# Patient Record
Sex: Female | Born: 1945
Health system: Southern US, Community
[De-identification: ages and names within clinical notes are randomized; demographics above are authoritative.]

## PROBLEM LIST (undated history)

## (undated) DIAGNOSIS — Z860100 Personal history of colon polyps, unspecified: Secondary | ICD-10-CM

## (undated) DIAGNOSIS — D649 Anemia, unspecified: Secondary | ICD-10-CM

## (undated) DIAGNOSIS — H269 Unspecified cataract: Secondary | ICD-10-CM

## (undated) DIAGNOSIS — L4050 Arthropathic psoriasis, unspecified: Secondary | ICD-10-CM

## (undated) DIAGNOSIS — E7800 Pure hypercholesterolemia, unspecified: Secondary | ICD-10-CM

## (undated) HISTORY — DX: Anemia, unspecified: D64.9

## (undated) HISTORY — DX: Unspecified cataract: H26.9

## (undated) HISTORY — PX: ABDOMINAL HYSTERECTOMY: SHX81

---
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
## 1984-10-04 HISTORY — PX: VESICOVAGINAL FISTULA CLOSURE W/ TAH: SUR271

## 1998-08-21 ENCOUNTER — Other Ambulatory Visit: Admission: RE | Admit: 1998-08-21 | Discharge: 1998-08-21 | Payer: Self-pay | Admitting: Obstetrics and Gynecology

## 1998-09-11 ENCOUNTER — Ambulatory Visit (HOSPITAL_COMMUNITY): Admission: RE | Admit: 1998-09-11 | Discharge: 1998-09-11 | Payer: Self-pay | Admitting: Obstetrics and Gynecology

## 1998-09-11 ENCOUNTER — Encounter: Payer: Self-pay | Admitting: Obstetrics and Gynecology

## 2000-01-05 ENCOUNTER — Encounter: Payer: Self-pay | Admitting: Obstetrics and Gynecology

## 2000-01-05 ENCOUNTER — Ambulatory Visit (HOSPITAL_COMMUNITY): Admission: RE | Admit: 2000-01-05 | Discharge: 2000-01-05 | Payer: Self-pay | Admitting: Obstetrics and Gynecology

## 2001-05-29 ENCOUNTER — Ambulatory Visit (HOSPITAL_COMMUNITY): Admission: RE | Admit: 2001-05-29 | Discharge: 2001-05-29 | Payer: Self-pay | Admitting: Obstetrics and Gynecology

## 2001-05-29 ENCOUNTER — Encounter: Payer: Self-pay | Admitting: Obstetrics and Gynecology

## 2001-06-20 ENCOUNTER — Other Ambulatory Visit: Admission: RE | Admit: 2001-06-20 | Discharge: 2001-06-20 | Payer: Self-pay | Admitting: Obstetrics and Gynecology

## 2002-06-08 ENCOUNTER — Ambulatory Visit (HOSPITAL_COMMUNITY): Admission: RE | Admit: 2002-06-08 | Discharge: 2002-06-08 | Payer: Self-pay | Admitting: Obstetrics and Gynecology

## 2002-06-08 ENCOUNTER — Encounter: Payer: Self-pay | Admitting: Obstetrics and Gynecology

## 2002-06-26 ENCOUNTER — Encounter: Admission: RE | Admit: 2002-06-26 | Discharge: 2002-06-26 | Payer: Self-pay | Admitting: Gynecology

## 2002-06-26 ENCOUNTER — Encounter: Payer: Self-pay | Admitting: Gynecology

## 2003-09-12 ENCOUNTER — Encounter: Admission: RE | Admit: 2003-09-12 | Discharge: 2003-09-12 | Payer: Self-pay | Admitting: Gynecology

## 2003-09-16 ENCOUNTER — Other Ambulatory Visit: Admission: RE | Admit: 2003-09-16 | Discharge: 2003-09-16 | Payer: Self-pay | Admitting: Gynecology

## 2003-11-27 ENCOUNTER — Ambulatory Visit (HOSPITAL_COMMUNITY): Admission: RE | Admit: 2003-11-27 | Discharge: 2003-11-27 | Payer: Self-pay | Admitting: Family Medicine

## 2004-12-10 ENCOUNTER — Encounter: Admission: RE | Admit: 2004-12-10 | Discharge: 2004-12-10 | Payer: Self-pay | Admitting: Gynecology

## 2005-09-20 ENCOUNTER — Other Ambulatory Visit: Admission: RE | Admit: 2005-09-20 | Discharge: 2005-09-20 | Payer: Self-pay | Admitting: Gynecology

## 2005-12-21 ENCOUNTER — Encounter (INDEPENDENT_AMBULATORY_CARE_PROVIDER_SITE_OTHER): Payer: Self-pay | Admitting: *Deleted

## 2005-12-21 ENCOUNTER — Ambulatory Visit: Payer: Self-pay | Admitting: Internal Medicine

## 2005-12-21 ENCOUNTER — Ambulatory Visit (HOSPITAL_COMMUNITY): Admission: RE | Admit: 2005-12-21 | Discharge: 2005-12-21 | Payer: Self-pay | Admitting: Internal Medicine

## 2006-03-15 ENCOUNTER — Encounter: Admission: RE | Admit: 2006-03-15 | Discharge: 2006-03-15 | Payer: Self-pay | Admitting: Gynecology

## 2006-11-09 ENCOUNTER — Ambulatory Visit: Payer: Self-pay | Admitting: Orthopedic Surgery

## 2006-11-22 ENCOUNTER — Encounter: Payer: Self-pay | Admitting: Orthopedic Surgery

## 2006-11-22 ENCOUNTER — Ambulatory Visit (HOSPITAL_COMMUNITY): Admission: RE | Admit: 2006-11-22 | Discharge: 2006-11-22 | Payer: Self-pay | Admitting: Family Medicine

## 2007-02-08 ENCOUNTER — Ambulatory Visit: Payer: Self-pay | Admitting: Orthopedic Surgery

## 2007-04-12 ENCOUNTER — Encounter: Admission: RE | Admit: 2007-04-12 | Discharge: 2007-04-12 | Payer: Self-pay | Admitting: Gynecology

## 2008-12-18 ENCOUNTER — Ambulatory Visit: Payer: Self-pay | Admitting: Orthopedic Surgery

## 2008-12-18 DIAGNOSIS — M25819 Other specified joint disorders, unspecified shoulder: Secondary | ICD-10-CM | POA: Insufficient documentation

## 2008-12-18 DIAGNOSIS — M25519 Pain in unspecified shoulder: Secondary | ICD-10-CM | POA: Insufficient documentation

## 2008-12-18 DIAGNOSIS — M7580 Other shoulder lesions, unspecified shoulder: Secondary | ICD-10-CM

## 2008-12-19 ENCOUNTER — Encounter: Payer: Self-pay | Admitting: Orthopedic Surgery

## 2009-01-17 ENCOUNTER — Emergency Department (HOSPITAL_COMMUNITY): Admission: EM | Admit: 2009-01-17 | Discharge: 2009-01-17 | Payer: Self-pay | Admitting: Emergency Medicine

## 2009-03-04 ENCOUNTER — Telehealth: Payer: Self-pay | Admitting: Orthopedic Surgery

## 2009-03-13 ENCOUNTER — Ambulatory Visit: Payer: Self-pay | Admitting: Orthopedic Surgery

## 2009-04-17 ENCOUNTER — Encounter (INDEPENDENT_AMBULATORY_CARE_PROVIDER_SITE_OTHER): Payer: Self-pay | Admitting: *Deleted

## 2009-07-07 ENCOUNTER — Ambulatory Visit: Payer: Self-pay | Admitting: Orthopedic Surgery

## 2009-07-07 DIAGNOSIS — M7500 Adhesive capsulitis of unspecified shoulder: Secondary | ICD-10-CM | POA: Insufficient documentation

## 2009-07-11 ENCOUNTER — Encounter (HOSPITAL_COMMUNITY): Admission: RE | Admit: 2009-07-11 | Discharge: 2009-08-10 | Payer: Self-pay | Admitting: Orthopedic Surgery

## 2009-07-14 ENCOUNTER — Telehealth: Payer: Self-pay | Admitting: Orthopedic Surgery

## 2009-08-08 ENCOUNTER — Encounter: Payer: Self-pay | Admitting: Orthopedic Surgery

## 2009-08-11 ENCOUNTER — Encounter (HOSPITAL_COMMUNITY): Admission: RE | Admit: 2009-08-11 | Discharge: 2009-09-10 | Payer: Self-pay | Admitting: Orthopedic Surgery

## 2009-08-21 ENCOUNTER — Encounter: Admission: RE | Admit: 2009-08-21 | Discharge: 2009-08-21 | Payer: Self-pay | Admitting: Gynecology

## 2009-08-27 ENCOUNTER — Ambulatory Visit: Payer: Self-pay | Admitting: Orthopedic Surgery

## 2009-10-30 ENCOUNTER — Ambulatory Visit (HOSPITAL_COMMUNITY): Admission: RE | Admit: 2009-10-30 | Discharge: 2009-10-30 | Payer: Self-pay | Admitting: Rheumatology

## 2010-08-25 ENCOUNTER — Encounter: Admission: RE | Admit: 2010-08-25 | Discharge: 2010-08-25 | Payer: Self-pay | Admitting: Gynecology

## 2010-09-15 ENCOUNTER — Ambulatory Visit: Payer: Self-pay | Admitting: Orthopedic Surgery

## 2010-09-15 DIAGNOSIS — M19019 Primary osteoarthritis, unspecified shoulder: Secondary | ICD-10-CM | POA: Insufficient documentation

## 2010-09-22 ENCOUNTER — Encounter (HOSPITAL_COMMUNITY)
Admission: RE | Admit: 2010-09-22 | Discharge: 2010-10-22 | Payer: Self-pay | Source: Home / Self Care | Attending: Orthopedic Surgery | Admitting: Orthopedic Surgery

## 2010-10-12 ENCOUNTER — Encounter
Admission: RE | Admit: 2010-10-12 | Discharge: 2010-11-03 | Payer: Self-pay | Source: Home / Self Care | Attending: Orthopedic Surgery | Admitting: Orthopedic Surgery

## 2010-10-13 ENCOUNTER — Encounter: Payer: Self-pay | Admitting: Orthopedic Surgery

## 2010-10-27 ENCOUNTER — Ambulatory Visit
Admission: RE | Admit: 2010-10-27 | Discharge: 2010-10-27 | Payer: Self-pay | Source: Home / Self Care | Attending: Orthopedic Surgery | Admitting: Orthopedic Surgery

## 2010-11-04 ENCOUNTER — Ambulatory Visit (HOSPITAL_COMMUNITY): Payer: Self-pay | Admitting: Occupational Therapy

## 2010-11-05 NOTE — Assessment & Plan Note (Signed)
Summary: 6 WE RECK AFTER PT/[facility]HC/BSF   Visit Type:  Follow-up  CC:  recheck on shoulder.  History of Present Illness: 65 yo female with 2 month history of pain in the RIGHT shoulder, associated with pain reaching across her chest. She does not note any previous history of trauma to the RIGHT shoulder. She did have impingement problems of the LEFT shoulder treated successfully with physical therapy, although the injection. We gave her didn't help.  She thinks that when she stopped exercising his shoulder got worse and she notes when she does some of the exercises that she learned for the  LEFT shoulder. Her RIGHT shoulder is fine   Today is 6 week recheck after PT, progress note for review from 10/21/10.  Diclofenac 50mg  two times a day.  She is better.  No pain today.  Has pain sometimes in PT.     Allergies: 1)  ! Lodine  Physical Exam  Additional Exam:   Things look really good. She has approximately 125 of forward elevation, and 80 of abduction.     Impression & Recommendations:  Problem # 1:  SHOULDER IMPINGEMENT SYNDROME, RIGHT (ICD-726.2) Assessment Improved  Orders: Est. Patient Level II (16109)  Patient Instructions: 1)  Continue therapy until discharged 2)  after you finish therapy do home exercises program 3)  Continue medication 4)  Come back as needed   Orders Added: 1)  Est. Patient Level II [60454]

## 2010-11-05 NOTE — Miscellaneous (Signed)
Summary: Evaluation OT  Evaluation OT   Imported By: Eugenio Hoes 10/14/2010 13:17:27  _____________________________________________________________________  External Attachment:    Type:   Image     Comment:   External Document

## 2010-11-05 NOTE — Assessment & Plan Note (Signed)
Summary: rt shoulder pain needs xr/uhc/bsf   Visit Type:  new problem   CC:  right shoulder pain.  History of Present Illness: 65 yo female with 2 month history of pain in the RIGHT shoulder, associated with pain reaching across her chest. She does not note any previous history of trauma to the RIGHT shoulder. She did have impingement problems of the LEFT shoulder treated successfully with physical therapy, although the injection. We gave her didn't help.  She thinks that when she stopped exercising his shoulder got worse and she notes when she does some of the exercises that she learned for the  LEFT shoulder. Her RIGHT shoulder is fine   Allergies: 1)  ! Lodine  Physical Exam  Skin:  intact without lesions or rashes Cervical Nodes:  no significant adenopathy Psych:  alert and cooperative; normal mood and affect; normal attention span and concentration   Shoulder/Elbow Exam  General:    wt 168 lbs  The patient is well developed and nourished, with normal grooming and hygiene. The body habitus is  small frame   Skin:    Intact, no scars, lesions, rashes, cafe au lait spots or bruising.    Inspection:    Inspection is normal.    Palpation:    tenderness R-AC:   Vascular:    Radial, ulnar, brachial, and axillary pulses 2+ and symmetric; capillary refill less than 2 seconds; no evidence of ischemia, clubbing, or cyanosis.    Sensory:    Gross sensation intact in the upper extremities.    Motor:    Normal strength in the right rotator cuff   Reflexes:    Normal reflexes in the upper extremities.    Shoulder Exam:    Right:    Inspection:  Abnormal    Palpation:  Abnormal    Stability:  stable    Tenderness:  right AC joint    AROM FLEXION: 120  PROM: 150       Range of Motion:       Flexion-Active: 0  Impingement Sign NEER:    Right positive Impingement Sign HAWKINS:    Right positive Apprehension Sign:    Right negative Relocation Test:    Right  negative Sulcus Sign:    Right negative AC joint Adduction Test:    Right positive   Impression & Recommendations:  Problem # 1:  OSTEOARTHRITIS, SHOULDER, RIGHT (ICD-715.91) Assessment New  AC JOINT   Orders: Est. Patient Level IV (04540) Shoulder x-ray,  minimum 2 views (73030)/RIGHT shoulder.  A normal glenohumeral joint normal scapulohumeral line. Type II acromion.  Impression normal shoulder  Problem # 2:  SHOULDER IMPINGEMENT SYNDROME, RIGHT (ICD-726.2) Assessment: New  Orders: Est. Patient Level IV (98119)  Problem # 3:  SHOULDER PAIN (ICD-719.41) Assessment: New  Orders: Est. Patient Level IV (14782)  Her updated medication list for this problem includes:    Diclofenac Sodium 50 Mg Tbec (Diclofenac sodium) .Marland Kitchen... 1 by mouth two times a day  Medications Added to Medication List This Visit: 1)  Diclofenac Sodium 50 Mg Tbec (Diclofenac sodium) .Marland Kitchen.. 1 by mouth two times a day  Patient Instructions: 1)  Take new medication 2)  go for Physical therapy for the shoulder 3)  come back in 6 week for a recheck. Prescriptions: DICLOFENAC SODIUM 50 MG TBEC (DICLOFENAC SODIUM) 1 by mouth two times a day  #60 x 1   Entered and Authorized by:   Fuller Canada MD   Signed by:   Duffy Rhody  Romeo Apple MD on 09/15/2010   Method used:   Print then Give to Patient   RxID:   [id number]    Orders Added: 1)  Est. Patient Level IV [14782] 2)  Shoulder x-ray,  minimum 2 views [73030]

## 2010-11-06 ENCOUNTER — Ambulatory Visit (HOSPITAL_COMMUNITY): Payer: Self-pay | Admitting: Occupational Therapy

## 2010-11-09 ENCOUNTER — Ambulatory Visit (HOSPITAL_COMMUNITY): Payer: Self-pay | Admitting: Occupational Therapy

## 2010-11-12 ENCOUNTER — Ambulatory Visit (HOSPITAL_COMMUNITY)
Admission: RE | Admit: 2010-11-12 | Discharge: 2010-11-12 | Disposition: A | Discharge status: Home / Self Care | Payer: 59 | Source: Ambulatory Visit | Attending: *Deleted | Admitting: *Deleted

## 2011-01-13 LAB — POCT CARDIAC MARKERS: Troponin i, poc: <0.05 ng/mL (ref 0.00–0.09)

## 2011-02-19 NOTE — Procedures (Signed)
NAME:  [name], [name]                     ACCOUNT NO.:  [id number]   MEDICAL RECORD NO.:  [id number]                   PATIENT TYPE:  OUT   LOCATION:  [facility]                                 FACILITY:  [facility]   PHYSICIAN:  Scott A. Gerda Diss, M.D.               DATE OF BIRTH:  [birth year]   DATE OF PROCEDURE:  DATE OF DISCHARGE:                                    STRESS TEST   INDICATIONS:  Severe tachycardia with exercise.   Baseline EKG:  No acute ST-segment changes.  Known slight ST-segment  elevation noted in II and III, also at V4, V5.   Heart rate response to exercise:  Heart rate gradually went up, reaching  maximum heart rate of 152 in stage IV, reached 135 in stage III.   ST-segment response to exercise:  The patient had no ST-segment depression  or T-wave inversion.   Arrhythmias:  The patient had 1 PVC during the whole event.  No accelerated  tachycardias.   Blood pressure response to exercise:  The patient had a good blood pressure  response to exercise but not hypertensive.   Recovery phase:  Uneventful.   Symptomatology:  Shortness of breath when above her maximum.   INTERPRETATION:  Negative stress test.  No sign of ischemia.  Also, there  are no stress-induced arrhythmias.  If the patient continues  to have a  perception of fast heart rate that comes with minimal activity, then the  next step would be a 30-day event monitor, but at this point in time I do  not feel that is necessary.  Recommend for the patient to proceed with her  exercise regimen and to use perceived exertion as a good baseline plus also  counting her heart rate and keeping it at 140 or less while exercising.  To  call if any problems.      ___________________________________________                                            Jonna Coup Gerda Diss, M.D.   Linus Orn  D:  11/27/2003  T:  11/28/2003  Job:  [id number]

## 2011-02-19 NOTE — Op Note (Signed)
[name], [name]           ACCOUNT NO.:  [id number]   MEDICAL RECORD NO.:  [id number]          PATIENT TYPE:  AMB   LOCATION:  DAY                           FACILITY:  [facility]   PHYSICIAN:  Lionel December, M.D.    DATE OF BIRTH:  04/18/1946   DATE OF PROCEDURE:  12/21/2005  DATE OF DISCHARGE:                                 OPERATIVE REPORT   PROCEDURE:  Colonoscopy.   INDICATIONS:  [name] is a 65 year old Afro-American female who is here  for screening colonoscopy. Family history is negative for colorectal  carcinoma. Her last colonoscopy in 2000 was incomplete.   Procedure and risks were reviewed with the patient and informed consent was  obtained.   MEDICINES FOR CONSCIOUS SEDATION:  Demerol 25 mg IV Versed 4 mg IV. Atropine  0.25 mg IV given for asymptomatic bradycardia.   FINDINGS:  Procedure performed in endoscopy suite. The patient's vital signs  and O2 saturations were monitored during procedure and remained stable. The  patient was placed in left lateral position and rectal examination  performed. No abnormality noted on external or digital exam. Olympus  videoscope was placed in the rectum and advanced under vision into sigmoid  colon and beyond. Redundant colon. Preparation was satisfactory. Scope was  passed into cecum using abdominal pressure. Cecum was identified by  ileocecal valve and appendiceal orifice. Pictures taken for the record. As  the scope was withdrawn colonic mucosa was carefully examined. There 2 small  polyps, no more than 3 mm in diameter at ascending colon which were ablated  via cold biopsy and submitted in one container. The mucosa of the rest of  the colon was normal. Rectal mucosa similarly was normal. Scope was  retroflexed to examine anorectal junction which was unremarkable. Endoscope  was straightened and withdrawn. The patient tolerated the procedure well.   FINAL DIAGNOSIS:  Two small polyps at the ascending colon ablated via  cold  biopsy. Rest of the exam was normal.   RECOMMENDATIONS:  Standard instructions given. I will be contacting the  patient with biopsy results and further recommendations.      Lionel December, M.D.  Electronically Signed     NR/MEDQ  D:  12/21/2005  T:  12/22/2005  Job:  [id number]

## 2011-06-16 ENCOUNTER — Telehealth (INDEPENDENT_AMBULATORY_CARE_PROVIDER_SITE_OTHER): Payer: Self-pay | Admitting: *Deleted

## 2011-06-16 DIAGNOSIS — Z86010 Personal history of colonic polyps: Secondary | ICD-10-CM

## 2011-06-16 NOTE — Telephone Encounter (Signed)
TCS sch'd 08/25/11 @ 12:00 (11:00), iron 10 days, split movi prep instructions mailed

## 2011-06-18 MED ORDER — PEG-KCL-NACL-NASULF-NA ASC-C 100 G PO SOLR: 1.0000 | Freq: Once | ORAL | Status: DC

## 2011-07-12 ENCOUNTER — Other Ambulatory Visit: Payer: Self-pay | Admitting: Gynecology

## 2011-07-12 DIAGNOSIS — Z1231 Encounter for screening mammogram for malignant neoplasm of breast: Secondary | ICD-10-CM

## 2011-08-09 ENCOUNTER — Other Ambulatory Visit (INDEPENDENT_AMBULATORY_CARE_PROVIDER_SITE_OTHER): Payer: Self-pay | Admitting: *Deleted

## 2011-08-09 DIAGNOSIS — Z86010 Personal history of colonic polyps: Secondary | ICD-10-CM

## 2011-08-10 ENCOUNTER — Encounter (HOSPITAL_COMMUNITY): Payer: Self-pay | Admitting: Pharmacy Technician

## 2011-08-23 ENCOUNTER — Telehealth (INDEPENDENT_AMBULATORY_CARE_PROVIDER_SITE_OTHER): Payer: Self-pay | Admitting: *Deleted

## 2011-08-23 NOTE — Telephone Encounter (Signed)
PCP/Requesting MD:  Retta Diones  Name: Taleisha Kaczynski  DOB: [birth year]  Home Phone: [phone]      Procedure: TCS  Reason/Indication:  Surveillance, hx polyps  Has patient had this procedure before?  yes  If so, when, by whom and where?  2007--ascending colon, tubular adenoma, no high grade dysplasia or malignancy  Is there a family history of colon cancer?  no  Who?  What age when diagnosed?    Is patient diabetic?   no      Does patient have prosthetic heart valve?  no  Do you have a pacemaker?  no  Has patient had joint replacement within last 12 months?  no  Is patient on Coumadin, Plavix and/or Aspirin? no  Medications: methotrexate 2.7 mg 7 tabs once a week, folic acid 1 mg 1-2 tab daily, calcium, replesta (vit-d) 50.00 iu weekly, fish oil, iron daily  Allergies: nkda  Pharmacy: [facility]  Medication Adjustment: iron 10 days

## 2011-08-23 NOTE — Telephone Encounter (Signed)
Agree with Surveillance colonoscopy.  Ann, please schedule surveillance colonoscopy  Elenora Gamma  NP-C

## 2011-08-24 MED ORDER — SODIUM CHLORIDE 0.45 % IV SOLN
Freq: Once | INTRAVENOUS | Status: AC
  Administered 2011-08-25: 20 mL/h via INTRAVENOUS

## 2011-08-25 ENCOUNTER — Ambulatory Visit (HOSPITAL_COMMUNITY)
Admission: RE | Admit: 2011-08-25 | Discharge: 2011-08-25 | Disposition: A | Discharge status: Home / Self Care | Payer: 59 | Source: Ambulatory Visit | Attending: Internal Medicine | Admitting: Internal Medicine

## 2011-08-25 ENCOUNTER — Encounter (HOSPITAL_COMMUNITY): Payer: Self-pay | Admitting: *Deleted

## 2011-08-25 ENCOUNTER — Encounter (HOSPITAL_COMMUNITY)
Admission: RE | Disposition: A | Discharge status: Home / Self Care | Payer: Self-pay | Source: Ambulatory Visit | Attending: Internal Medicine

## 2011-08-25 DIAGNOSIS — Z09 Encounter for follow-up examination after completed treatment for conditions other than malignant neoplasm: Secondary | ICD-10-CM | POA: Insufficient documentation

## 2011-08-25 DIAGNOSIS — Z860100 Personal history of colon polyps, unspecified: Secondary | ICD-10-CM | POA: Insufficient documentation

## 2011-08-25 DIAGNOSIS — E7800 Pure hypercholesterolemia, unspecified: Secondary | ICD-10-CM | POA: Insufficient documentation

## 2011-08-25 DIAGNOSIS — K5730 Diverticulosis of large intestine without perforation or abscess without bleeding: Secondary | ICD-10-CM | POA: Insufficient documentation

## 2011-08-25 HISTORY — PX: COLONOSCOPY: SHX5424

## 2011-08-25 HISTORY — DX: Arthropathic psoriasis, unspecified: L40.50

## 2011-08-25 HISTORY — DX: Personal history of colon polyps, unspecified: Z86.0100

## 2011-08-25 HISTORY — DX: Pure hypercholesterolemia, unspecified: E78.00

## 2011-08-25 HISTORY — DX: Personal history of colonic polyps: Z86.010

## 2011-08-25 SURGERY — COLONOSCOPY
Anesthesia: Moderate Sedation

## 2011-08-25 MED ORDER — MEPERIDINE HCL 50 MG/ML IJ SOLN
INTRAMUSCULAR | Status: DC | PRN
  Administered 2011-08-25: 25 mg

## 2011-08-25 MED ORDER — MIDAZOLAM HCL 5 MG/5ML IJ SOLN
INTRAMUSCULAR | Status: AC
  Filled 2011-08-25: qty 10

## 2011-08-25 MED ORDER — ATROPINE SULFATE 1 MG/ML IJ SOLN
INTRAMUSCULAR | Status: AC
  Filled 2011-08-25: qty 1

## 2011-08-25 MED ORDER — STERILE WATER FOR IRRIGATION IR SOLN
Status: DC | PRN
  Administered 2011-08-25: 12:00:00

## 2011-08-25 MED ORDER — ATROPINE SULFATE 1 MG/ML IJ SOLN
INTRAMUSCULAR | Status: DC | PRN
  Administered 2011-08-25: .5 mg via INTRAVENOUS

## 2011-08-25 MED ORDER — MIDAZOLAM HCL 5 MG/5ML IJ SOLN
INTRAMUSCULAR | Status: DC | PRN
  Administered 2011-08-25: 1 mg via INTRAVENOUS
  Administered 2011-08-25 (×2): 2 mg via INTRAVENOUS

## 2011-08-25 MED ORDER — MEPERIDINE HCL 50 MG/ML IJ SOLN
INTRAMUSCULAR | Status: AC
  Filled 2011-08-25: qty 1

## 2011-08-25 NOTE — H&P (Signed)
[name] is an 65 y.o. female.   Chief Complaint: Patient is here for colonoscopy. HPI: Patient is 65 year old African female who had two small tubular adenomas removed from her ascending colon in March 2007. She is here for surveillance colonoscopy. Family history is negative for colorectal carcinoma. She has good appetite. She denies abdominal pain, change in her bowel habits or rectal bleeding.  Past Medical History  Diagnosis Date  . Hypercholesterolemia   . Psoriatic arthritis   . Hx of colonic polyps     Past Surgical History  Procedure Date  . Left knee staple in 1962  . Left knee stable out 1966  . Vesicovaginal fistula closure w/ tah 1986    Family History  Problem Relation Age of Onset  . Arthritis      FH  . Diabetes      FH  . Cancer      FH  . Anesthesia problems Mother   . Hypotension Neg Hx   . Malignant hyperthermia Neg Hx   . Pseudochol deficiency Neg Hx    Social History:  reports that she has never smoked. She does not have any smokeless tobacco history on file. She reports that she does not drink alcohol or use illicit drugs.  Allergies:  Allergies  Allergen Reactions  . Etodolac     REACTION: chest pain    Medications Prior to Admission  Medication Dose Route Frequency Provider Last Rate Last Dose  . 0.45 % sodium chloride infusion   Intravenous Once Malissa Hippo, MD 20 mL/hr at 08/25/11 1151 20 mL/hr at 08/25/11 1151  . meperidine (DEMEROL) 50 MG/ML injection           . midazolam (VERSED) 5 MG/5ML injection           . simethicone susp in sterile water 1000 mL irrigation    PRN Malissa Hippo, MD       Medications Prior to Admission  Medication Sig Dispense Refill  . peg 3350 powder (MOVIPREP) 100 G SOLR Take 1 kit (100 g total) by mouth once.  1 kit  0    No results found for this or any previous visit (from the past 48 hour(s)). No results found.  Review of Systems  Constitutional: Negative for weight loss.    Gastrointestinal: Negative for abdominal pain, diarrhea, constipation, blood in stool and melena.    Blood pressure 108/60, pulse 51, temperature 97.9 F (36.6 C), temperature source Oral, resp. rate 14, height 5\' 3"  (1.6 m), weight 155 lb (70.308 kg), SpO2 100.00%. Physical Exam  Constitutional: She appears well-developed and well-nourished.  HENT:  Mouth/Throat: Oropharynx is clear and moist.  Eyes: Conjunctivae are normal. No scleral icterus.  Neck: No thyromegaly present.  Cardiovascular: Normal rate, regular rhythm and normal heart sounds.   No murmur heard. Respiratory: Effort normal and breath sounds normal.  GI: Soft. She exhibits no distension and no mass. There is no tenderness.  Musculoskeletal: She exhibits no edema.  Lymphadenopathy:    She has no cervical adenopathy.  Neurological: She is alert.  Skin: Skin is warm.     Assessment/Plan History of colonic adenomas. Surveillance colonoscopy.  [name] 08/25/2011, 12:03 PM

## 2011-08-25 NOTE — Op Note (Signed)
COLONOSCOPY PROCEDURE REPORT  PATIENT:  [name]  MR#:  [id number] Birthdate:  1946-10-04, 65 y.o., female Endoscopist:  Dr. Malissa Hippo, MD Referred By:  Dr. Lilyan Punt, MD  Procedure Date: 08/25/2011  Procedure:   Colonoscopy  Indications:  Patient is 65 year old African American female who at 2 small tubular adenomas removed from her ascending colon in March 2007. She is undergoing surveillance colonoscopy. Family history is negative for colorectal carcinoma.  Informed Consent: Procedure and risks were reviewed with the patient and informed consent was obtained.   Medications:  Demerol 25 mg IV Versed 5 mg IV  Description of procedure:  After a digital rectal exam was performed, that colonoscope was advanced from the anus through the rectum and colon to the area of the cecum, ileocecal valve and appendiceal orifice. The cecum was deeply intubated. These structures were well-seen and photographed for the record. From the level of the cecum and ileocecal valve, the scope was slowly and cautiously withdrawn. The mucosal surfaces were carefully surveyed utilizing scope tip to flexion to facilitate fold flattening as needed. The scope was pulled down into the rectum where a thorough exam including retroflexion was performed.  Findings:   Prep excellent. Single diverticulum next to ileocecal valve. Normal mucosa of colon and rectum . Unremarkable anorectal junction l  Therapeutic/Diagnostic Maneuvers Performed:  None  Complications:  None  Cecal Withdrawal Time:  8 minutes  Impression:  Examination performed to cecum. Single cecal diverticulum otherwise normal colonoscopy.  Recommendations:  Standard instructions given. Since no polyps found on today's exam, she could wait seven years before her next colonoscopy.  [name] U  08/25/2011 12:38 PM  CC: Dr. Lilyan Punt, MD.

## 2011-08-30 ENCOUNTER — Ambulatory Visit
Admission: RE | Admit: 2011-08-30 | Discharge: 2011-08-30 | Disposition: A | Discharge status: Home / Self Care | Payer: 59 | Source: Ambulatory Visit | Attending: Gynecology | Admitting: Gynecology

## 2011-08-30 DIAGNOSIS — Z1231 Encounter for screening mammogram for malignant neoplasm of breast: Secondary | ICD-10-CM

## 2011-09-02 ENCOUNTER — Encounter (HOSPITAL_COMMUNITY): Payer: Self-pay | Admitting: Internal Medicine

## 2012-07-18 ENCOUNTER — Other Ambulatory Visit: Payer: Self-pay

## 2012-07-18 DIAGNOSIS — M79609 Pain in unspecified limb: Secondary | ICD-10-CM

## 2012-08-07 ENCOUNTER — Encounter: Payer: Self-pay | Admitting: Vascular Surgery

## 2012-08-08 ENCOUNTER — Ambulatory Visit (INDEPENDENT_AMBULATORY_CARE_PROVIDER_SITE_OTHER): Payer: Medicare Other | Admitting: Vascular Surgery

## 2012-08-08 ENCOUNTER — Encounter: Payer: Self-pay | Admitting: Vascular Surgery

## 2012-08-08 VITALS — BP 127/79 | HR 47 | Resp 18 | Ht 63.0 in | Wt 145.0 lb

## 2012-08-08 DIAGNOSIS — R230 Cyanosis: Secondary | ICD-10-CM | POA: Insufficient documentation

## 2012-08-08 DIAGNOSIS — M79609 Pain in unspecified limb: Secondary | ICD-10-CM

## 2012-08-08 NOTE — Progress Notes (Signed)
Ankle brachial indices performed @ [facility] 08/08/2012

## 2012-08-08 NOTE — Progress Notes (Signed)
Subjective:     Patient ID: [name], female   DOB: Apr 20, 1946, 66 y.o.   MRN: [id number]  HPI this 66 year old female was referred by Dr. Lilyan Punt for possible circulation issues right foot. The patient states that she has noted some discoloration of her right fourth toe and some "wrinkling" of the skin between the toes. She has no history of ulceration, gangrene, rest pain, numbness in the foot, or nonhealing ulcers. She denies claudication is able to ambulate a few miles without difficulty. Her father had diabetes and she is concerned about circulation issues.  Past Medical History  Diagnosis Date  . Hypercholesterolemia   . Psoriatic arthritis   . Hx of colonic polyps     History  Substance Use Topics  . Smoking status: Never Smoker   . Smokeless tobacco: Never Used  . Alcohol Use: No    Family History  Problem Relation Age of Onset  . Arthritis      FH  . Diabetes      FH  . Cancer      FH  . Anesthesia problems Mother   . Cancer Mother   . Hypertension Mother   . Hypotension Neg Hx   . Malignant hyperthermia Neg Hx   . Pseudochol deficiency Neg Hx   . Cancer Father   . Diabetes Father   . Deep vein thrombosis Father   . Hypertension Father   . Hypertension Sister   . Hypertension Brother   . Heart disease Brother     Allergies  Allergen Reactions  . Etodolac     REACTION: chest pain    Current outpatient prescriptions:calcium carbonate (OS-CAL) 600 MG TABS, Take 600 mg by mouth 2 (two) times daily with a meal.  , Disp: , Rfl: ;  ferrous fumarate (HEMOCYTE - 106 MG FE) 325 (106 FE) MG TABS, Take 1 tablet by mouth daily.  , Disp: , Rfl: ;  fish oil-omega-3 fatty acids 1000 MG capsule, Take 1 g by mouth daily.  , Disp: , Rfl: ;  peg 3350 powder (MOVIPREP) 100 G SOLR, Take 1 kit (100 g total) by mouth once., Disp: 1 kit, Rfl: 0 pravastatin (PRAVACHOL) 20 MG tablet, Take 20 mg by mouth at bedtime.  , Disp: , Rfl:   BP 127/79  Pulse 47  Resp 18  Ht  5\' 3"  (1.6 m)  Wt 145 lb (65.772 kg)  BMI 25.69 kg/m2  Body mass index is 25.69 kg/(m^2).          Review of Systems denies chest pain, dyspnea on exertion, PND, orthopnea, hemoptysis, All systems negative and complete review of systems    Objective:   Physical Exam blood pressure 127/79 heart rate 47 respirations 18 Gen.-alert and oriented x3 in no apparent distress HEENT normal for age Lungs no rhonchi or wheezing Cardiovascular regular rhythm no murmurs carotid pulses 3+ palpable no bruits audible Abdomen soft nontender no palpable masses Musculoskeletal free of  major deformities Skin clear -no rashes Neurologic normal Lower extremities 3+ femoral and dorsalis pedis pulses palpable bilaterally with no edema Slight darkening of the skin between the right third and fourth toes no ulceration, ischemia, gangrene, or infection  Today I ordered lower extremity arterial Doppler studies which are reviewed and interpreted. Patient has normal ABIs bilaterally with triphasic flow-normal study     Assessment:     Normal lower extremity arterial exam-slight discoloration right fourth toe-does not appear embolic in nature or ischemic    Plan:  Recommend aspirin 81 mg one daily Continue to observe lower extremities no other treatment needed at this time

## 2012-09-08 ENCOUNTER — Other Ambulatory Visit: Payer: Self-pay | Admitting: Gynecology

## 2012-09-08 ENCOUNTER — Encounter (INDEPENDENT_AMBULATORY_CARE_PROVIDER_SITE_OTHER): Payer: Self-pay | Admitting: *Deleted

## 2012-09-08 DIAGNOSIS — Z1231 Encounter for screening mammogram for malignant neoplasm of breast: Secondary | ICD-10-CM

## 2012-09-12 ENCOUNTER — Ambulatory Visit (INDEPENDENT_AMBULATORY_CARE_PROVIDER_SITE_OTHER): Payer: Medicare Other | Admitting: Internal Medicine

## 2012-09-12 ENCOUNTER — Encounter (INDEPENDENT_AMBULATORY_CARE_PROVIDER_SITE_OTHER): Payer: Self-pay | Admitting: Internal Medicine

## 2012-09-12 VITALS — BP 104/62 | HR 72 | Temp 97.2°F | Ht 62.2 in | Wt 147.2 lb

## 2012-09-12 DIAGNOSIS — D649 Anemia, unspecified: Secondary | ICD-10-CM | POA: Insufficient documentation

## 2012-09-12 NOTE — Patient Instructions (Addendum)
CBC today.  

## 2012-09-12 NOTE — Progress Notes (Addendum)
Subjective:     Patient ID: [name], female   DOB: [birth year], 66 y.o.   MRN: [id number]  HPI Referred to our office by Alycia Rossetti for iron deficiency anemia/colonoscopy.  Noted11/26/2013 H and H 10.5 and 31.2, MCV 73.2, Iron 58, Vitamin B12 671, Ferritin 24, She tells me she has been anemic in the past. Appetite is good. No weight loss unintentional . No abdominal pain. No acid reflux. Has a BM about  2-4 times a day which is normal. Stools are dark green to brown. No melena or bright red rectal bleeding. No NSAIDS She tells me she just gave blood November 15. She tells me her hemoglobin was 12.0 then.  08/25/2011 Colonoscopy for hx of tubular adenomas:Impression:  Examination performed to cecum.  Single cecal diverticulum otherwise normal colonoscopy.      Review of Systems see hpi Current Outpatient Prescriptions  Medication Sig Dispense Refill  . ferrous fumarate (HEMOCYTE - 106 MG FE) 325 (106 FE) MG TABS Take 1 tablet by mouth daily.        . fish oil-omega-3 fatty acids 1000 MG capsule Take 1 g by mouth daily.         Past Medical History  Diagnosis Date  . Hypercholesterolemia   . Psoriatic arthritis   . Hx of colonic polyps    Past Surgical History  Procedure Date  . Left knee staple in 1962  . Left knee stable out 1966  . Vesicovaginal fistula closure w/ tah 1986  . Colonoscopy 08/25/2011    Procedure: COLONOSCOPY;  Surgeon: Malissa Hippo, MD;  Location: [facility];  Service: Endoscopy;  Laterality: N/A;  12:00   Allergies  Allergen Reactions  . Etodolac     REACTION: chest pain       Objective:   Physical Exam  Filed Vitals:   09/12/12 1554  BP: 104/62  Pulse: 72  Temp: 97.2 F (36.2 C)  Height: 5' 2.2" (1.58 m)  Weight: 147 lb 3.2 oz (66.769 kg)   Alert and oriented. Skin warm and dry. Oral mucosa is moist.   . Sclera anicteric, conjunctivae is pink. Thyroid not enlarged. No cervical lymphadenopathy. Lungs clear. Heart regular rate  and rhythm.  Abdomen is soft. Bowel sounds are positive. No hepatomegaly. No abdominal masses felt. No tenderness.  No edema to lower extremities.  Stool brown and guaiac negative.     Assessment:    Anemia. Recent colonoscopy in 2012 was normal. Recently gave blood to the [employer]. Heme negative today in office.     Plan:    CBC today. Hopefully H and H increased. I will discuss with Dr. Karilyn Cota.

## 2012-09-13 LAB — CBC WITH DIFFERENTIAL/PLATELET
Eosinophils Absolute: 0.1 10*3/uL (ref 0.0–0.7)
HCT: 32.4 % — ABNORMAL LOW (ref 36.0–46.0)
Lymphocytes Relative: 49 % — ABNORMAL HIGH (ref 12–46)
Lymphs Abs: 2.9 10*3/uL (ref 0.7–4.0)
MCHC: 33.6 g/dL (ref 30.0–36.0)
Monocytes Relative: 7 % (ref 3–12)
Neutrophils Relative %: 41 % — ABNORMAL LOW (ref 43–77)

## 2012-09-18 ENCOUNTER — Encounter (INDEPENDENT_AMBULATORY_CARE_PROVIDER_SITE_OTHER): Payer: Self-pay

## 2012-09-20 ENCOUNTER — Telehealth (INDEPENDENT_AMBULATORY_CARE_PROVIDER_SITE_OTHER): Payer: Self-pay | Admitting: Internal Medicine

## 2012-09-20 NOTE — Telephone Encounter (Signed)
[name], needs CBC in 2 weeks. Thank you

## 2012-09-21 ENCOUNTER — Telehealth (INDEPENDENT_AMBULATORY_CARE_PROVIDER_SITE_OTHER): Payer: Self-pay | Admitting: *Deleted

## 2012-09-21 DIAGNOSIS — D649 Anemia, unspecified: Secondary | ICD-10-CM

## 2012-09-21 NOTE — Telephone Encounter (Signed)
This has been noted.

## 2012-09-21 NOTE — Telephone Encounter (Signed)
Per Dorene Ar NP the patient will need to have CBC/D in 2 weeks

## 2012-09-28 ENCOUNTER — Encounter (INDEPENDENT_AMBULATORY_CARE_PROVIDER_SITE_OTHER): Payer: Self-pay

## 2012-09-28 ENCOUNTER — Telehealth (INDEPENDENT_AMBULATORY_CARE_PROVIDER_SITE_OTHER): Payer: Self-pay | Admitting: Internal Medicine

## 2012-09-28 NOTE — Telephone Encounter (Signed)
ALL 3 STOOL CARDS ARE NEGATIVE.

## 2012-10-18 ENCOUNTER — Ambulatory Visit
Admission: RE | Admit: 2012-10-18 | Discharge: 2012-10-18 | Disposition: A | Discharge status: Home / Self Care | Payer: Medicare Other | Source: Ambulatory Visit | Attending: Gynecology | Admitting: Gynecology

## 2012-10-18 DIAGNOSIS — Z1231 Encounter for screening mammogram for malignant neoplasm of breast: Secondary | ICD-10-CM

## 2012-12-22 ENCOUNTER — Other Ambulatory Visit: Payer: Self-pay | Admitting: *Deleted

## 2012-12-22 ENCOUNTER — Telehealth: Payer: Self-pay | Admitting: Family Medicine

## 2012-12-22 DIAGNOSIS — E559 Vitamin D deficiency, unspecified: Secondary | ICD-10-CM

## 2012-12-22 DIAGNOSIS — D509 Iron deficiency anemia, unspecified: Secondary | ICD-10-CM

## 2012-12-22 NOTE — Telephone Encounter (Signed)
Nurse: Msg: Patient is requesting bloodwork order * please include Vit D and Iron check CB:[phone]

## 2012-12-22 NOTE — Telephone Encounter (Signed)
Cbc,ferritin, tibc, vitd    Reason iron def anemia and vit d

## 2012-12-23 ENCOUNTER — Encounter: Payer: Self-pay | Admitting: *Deleted

## 2012-12-29 ENCOUNTER — Other Ambulatory Visit: Payer: Self-pay

## 2012-12-29 DIAGNOSIS — D509 Iron deficiency anemia, unspecified: Secondary | ICD-10-CM

## 2012-12-29 DIAGNOSIS — E559 Vitamin D deficiency, unspecified: Secondary | ICD-10-CM

## 2012-12-29 LAB — IRON AND TIBC
%SAT: 15 % — ABNORMAL LOW (ref 20–55)
Iron: 55 ug/dL (ref 42–145)
TIBC: 358 ug/dL (ref 250–470)

## 2012-12-29 LAB — CBC
Hemoglobin: 11.9 g/dL — ABNORMAL LOW (ref 12.0–15.0)
MCHC: 33.7 g/dL (ref 30.0–36.0)
Platelets: 251 10*3/uL (ref 150–400)
RDW: 17.2 % — ABNORMAL HIGH (ref 11.5–15.5)

## 2012-12-30 LAB — VITAMIN D 25 HYDROXY (VIT D DEFICIENCY, FRACTURES): Vit D, 25-Hydroxy: 47 ng/mL (ref 30–89)

## 2013-01-03 ENCOUNTER — Telehealth: Payer: Self-pay | Admitting: Family Medicine

## 2013-01-03 NOTE — Telephone Encounter (Signed)
Mailed copy of lab results to patient. Patient notified.

## 2013-01-03 NOTE — Telephone Encounter (Signed)
Patient would like a copy of her Blood work results.  States she called on Monday 01/01/2013 and was told she could view online, however no one called her back with instructions to view.  Also wants to know if she needs an appointment with Doctor.

## 2013-01-04 ENCOUNTER — Telehealth: Payer: Self-pay | Admitting: Family Medicine

## 2013-01-04 NOTE — Telephone Encounter (Signed)
Discussed with patient. Patient scheduled office visit to discuss labs. 

## 2013-01-04 NOTE — Telephone Encounter (Signed)
Her lab work was reviewed. I compared it was in the chart. I do believe she ought to followup for an office visit. Hemoglobin actually somewhat better than what it was. We can discuss more when she follows up.

## 2013-01-04 NOTE — Telephone Encounter (Signed)
Nurse: 1:19PM  Message: [name] was directed back to the provider office via [facility]/IT. She is needing her activation code to be able to access her my chart.  (*) [name] would like to know if Dr. Lorin Picket would like to see her since she has completed her lab/bloodwork.

## 2013-01-09 ENCOUNTER — Ambulatory Visit (INDEPENDENT_AMBULATORY_CARE_PROVIDER_SITE_OTHER): Payer: Medicare Other | Admitting: Family Medicine

## 2013-01-09 ENCOUNTER — Encounter: Payer: Self-pay | Admitting: Family Medicine

## 2013-01-09 VITALS — BP 112/76 | HR 60 | Ht 63.0 in | Wt 148.0 lb

## 2013-01-09 DIAGNOSIS — E785 Hyperlipidemia, unspecified: Secondary | ICD-10-CM | POA: Insufficient documentation

## 2013-01-09 NOTE — Progress Notes (Signed)
Subjective:     Patient ID: [name], female   DOB: 05-24-1946, 67 y.o.   MRN: [id number]  HPIpatient doing a great job watching her diet taking red rice yeast extract she is also intriguing for her psoriasis with methotrexate and also has a history of iron deficient anemia but she is up-to-date on colonoscopy had a colonoscopy in 2012 she denies any abdominal pain sweats chills or fever.  Family history social history personal history all reviewed   Review of Systemsdenies chest tightness pressure pain shortness of breath     Objective:   Physical Exam Vital signs stable blood pressure recheck and is good lungs are clear hearts regular abdomen soft extremities no edema skin warm dry    Assessment:     Hyperlipidemia check lab work toward the end of May continue red rice yeast extract, continue methotrexate for psoriasis followed by the specialist, she will be seeing Dr. Nicholas Lose in the near future and getting bone density test. She ought to followup with [place] again in approximately 6-9 months.     Plan:     See above

## 2013-01-09 NOTE — Patient Instructions (Signed)
Continue to follow a healthy diet. Continued to do your exercises. I would not recommend any changes currently. I would like to see back in 6 months. Have a great summer.

## 2013-01-22 ENCOUNTER — Other Ambulatory Visit: Payer: Self-pay | Admitting: Gynecology

## 2013-01-22 DIAGNOSIS — E2839 Other primary ovarian failure: Secondary | ICD-10-CM

## 2013-01-23 ENCOUNTER — Other Ambulatory Visit: Payer: Self-pay | Admitting: Dermatology

## 2013-01-26 ENCOUNTER — Other Ambulatory Visit: Payer: Medicare Other

## 2013-01-30 ENCOUNTER — Ambulatory Visit
Admission: RE | Admit: 2013-01-30 | Discharge: 2013-01-30 | Disposition: A | Discharge status: Home / Self Care | Payer: Medicare Other | Source: Ambulatory Visit | Attending: Gynecology | Admitting: Gynecology

## 2013-01-30 DIAGNOSIS — E2839 Other primary ovarian failure: Secondary | ICD-10-CM

## 2013-02-22 ENCOUNTER — Encounter: Payer: Self-pay | Admitting: Family Medicine

## 2013-02-22 ENCOUNTER — Ambulatory Visit (INDEPENDENT_AMBULATORY_CARE_PROVIDER_SITE_OTHER): Payer: Medicare Other | Admitting: Family Medicine

## 2013-02-22 VITALS — BP 118/78 | HR 70 | Wt 148.8 lb

## 2013-02-22 DIAGNOSIS — G4700 Insomnia, unspecified: Secondary | ICD-10-CM | POA: Insufficient documentation

## 2013-02-22 NOTE — Progress Notes (Signed)
  Subjective:    Patient ID: [name], female    DOB: 1946-09-16, 67 y.o.   MRN: [id number]  HPI Patient in today for Insomnia. States she has been having trouble sleeping for years but it has gotten worse since the death of her husband 2 years.  This patient denies any coughing wheezing difficulty breathing at night denies any headaches fevers chills she states she can fall asleep but she doesn't stay asleep more than a few hours and she wakes up and she is awake for several hours before falling back asleep she denies excessive tiredness during the day she denies excessive drowsiness. Does not fall asleep with driving. Past medical history benign Social her husband died 2 years ago  Review of Systems    ROS see above, she denies restless legs Objective:   Physical Exam Blood pressure good lungs clear heart regular       Assessment & Plan:  Insomnia-long discussion held with patient 15-20 minutes. Many different choices could be tried. I recommend melatonin. No more than 1 mg at the evening. If this doesn't help over the next few weeks then consider trazodone. I do not feel that nerve tablets would be beneficial for this patient. I don't recommend Ambien because she does want to be able to wake up during the night.

## 2013-02-22 NOTE — Patient Instructions (Signed)
Melatonin 1 mg 60 minutes before bedtime  Trazadone is an option

## 2013-03-21 LAB — LIPID PANEL
Cholesterol: 229 mg/dL — ABNORMAL HIGH (ref 0–200)
Triglycerides: 83 mg/dL (ref <150)
VLDL: 17 mg/dL (ref 0–40)

## 2013-03-23 ENCOUNTER — Telehealth: Payer: Self-pay | Admitting: Family Medicine

## 2013-03-23 NOTE — Telephone Encounter (Signed)
Patient is calling to inquire about Lab results.  Please contact patient regarding these results when the are complete.  Thanks

## 2013-03-26 ENCOUNTER — Telehealth: Payer: Self-pay | Admitting: *Deleted

## 2013-03-26 NOTE — Telephone Encounter (Signed)
See lab results with notation in labs

## 2013-07-02 ENCOUNTER — Other Ambulatory Visit: Payer: Self-pay | Admitting: Gynecology

## 2013-07-02 DIAGNOSIS — N644 Mastodynia: Secondary | ICD-10-CM

## 2013-07-05 ENCOUNTER — Telehealth: Payer: Self-pay | Admitting: Family Medicine

## 2013-07-05 NOTE — Telephone Encounter (Signed)
Pt found lump under arm and having right breast pain. She saw gyn and she has a breast ultrasound scheduled for Monday. She has a family history of cancer on both sides. She has a follow up with Dr. Lorin Picket on the 23rd of this month. Would like your opinion on seeing someone who prescribes alternative meds or natural meds.

## 2013-07-05 NOTE — Telephone Encounter (Signed)
Patient has questions she would like to talk to Dr Lorin Picket about before her upcoming appointment.

## 2013-07-05 NOTE — Telephone Encounter (Signed)
Left message on voicemail to return call.

## 2013-07-05 NOTE — Telephone Encounter (Signed)
I do not know anyone in that particular field. It is very important note that the patient follow through regarding testing on this area in the breast. I will be happy to discuss further with her when she follows up on the 23rd. She should certainly consider getting ultrasound if recommended by gynecology. If she is interested in seeing a breast specialist we can set that up.

## 2013-07-09 ENCOUNTER — Other Ambulatory Visit: Payer: Self-pay | Admitting: Gynecology

## 2013-07-09 ENCOUNTER — Ambulatory Visit
Admission: RE | Admit: 2013-07-09 | Discharge: 2013-07-09 | Disposition: A | Discharge status: Home / Self Care | Payer: Medicare Other | Source: Ambulatory Visit | Attending: Gynecology | Admitting: Gynecology

## 2013-07-09 DIAGNOSIS — N644 Mastodynia: Secondary | ICD-10-CM

## 2013-07-26 ENCOUNTER — Ambulatory Visit: Payer: Medicare Other | Admitting: Family Medicine

## 2013-08-01 NOTE — Telephone Encounter (Signed)
Patient has office visit scheduled to discuss. Ultrasound and diagnostic mammo was norma 07/09/13-Patient aware-see report.

## 2013-08-06 ENCOUNTER — Encounter: Payer: Self-pay | Admitting: Family Medicine

## 2013-08-06 ENCOUNTER — Ambulatory Visit (INDEPENDENT_AMBULATORY_CARE_PROVIDER_SITE_OTHER): Payer: Medicare Other | Admitting: Family Medicine

## 2013-08-06 VITALS — BP 126/82 | Ht 62.0 in | Wt 143.6 lb

## 2013-08-06 DIAGNOSIS — E785 Hyperlipidemia, unspecified: Secondary | ICD-10-CM

## 2013-08-06 DIAGNOSIS — D649 Anemia, unspecified: Secondary | ICD-10-CM

## 2013-08-06 DIAGNOSIS — M8580 Other specified disorders of bone density and structure, unspecified site: Secondary | ICD-10-CM | POA: Insufficient documentation

## 2013-08-06 DIAGNOSIS — M25511 Pain in right shoulder: Secondary | ICD-10-CM

## 2013-08-06 DIAGNOSIS — E559 Vitamin D deficiency, unspecified: Secondary | ICD-10-CM

## 2013-08-06 DIAGNOSIS — M899 Disorder of bone, unspecified: Secondary | ICD-10-CM

## 2013-08-06 DIAGNOSIS — G4700 Insomnia, unspecified: Secondary | ICD-10-CM

## 2013-08-06 DIAGNOSIS — M25519 Pain in unspecified shoulder: Secondary | ICD-10-CM

## 2013-08-06 NOTE — Progress Notes (Signed)
  Subjective:    Patient ID: [name], female    DOB: 10/11/1945, 67 y.o.   MRN: [id number]  HPI Patient arrives for a follow up on cholesterol. Patient continues to have problems sleeping. Patient states she's been trying to follow a relatively healthy diet. She also relates that she does the best he can add staying physically active. Recently went to a wellness course where they taught her healthy eating. She currently is not taking in any knee. She denies fever sweats chills vomiting. She has a history cholesterol arthritis she also has a history of vitamin D deficiency osteopenia as well as psoriasis. She is on methotrexate. She states she has had some fatigue recently she wonders if this is related to anemia related to her methotrexate. She does not take an B12 she has had vitamin D deficiency in the past. She does take vitamin D currently. Family history noncontributory Review of systems see above see above she also relates that she has a pertinent pronounced knot On the proximal clavicle on the right side  Review of Systems     Objective:   Physical Exam Her lungs are clear no crackles respiratory rate is normal heart is regular abdomen is soft extremities no edema skin warm dry neurologic grossly normal patient does have a pronounced calcium deposits on the proximal aspect of the right clavicle       Assessment & Plan:  [employer]- [name] is working with it currently does not want any medications Hyperlipid--she relates she's taking red rice yeast extract she is trying to follow a healthy diet as much is possible Clavicle calcification proximal aspect-we will get x-ray more than likely calcium deposit Anemia probably related to methotrexate-patient vegetarian possible B12 deficiency History vitamin D deficiency-taking vitamin D but with history of osteopenia Will check vitamin D level Significant issues in time spent [id number]

## 2013-08-08 LAB — CBC
HCT: 34.5 % — ABNORMAL LOW (ref 36.0–46.0)
Hemoglobin: 12.1 g/dL (ref 12.0–15.0)
MCH: 25.6 pg — ABNORMAL LOW (ref 26.0–34.0)
MCHC: 35.1 g/dL (ref 30.0–36.0)
MCV: 72.9 fL — ABNORMAL LOW (ref 78.0–100.0)
RBC: 4.73 MIL/uL (ref 3.87–5.11)

## 2013-08-08 LAB — LIPID PANEL
LDL Cholesterol: 140 mg/dL — ABNORMAL HIGH (ref 0–99)
Total CHOL/HDL Ratio: 3.3 Ratio
VLDL: 10 mg/dL (ref 0–40)

## 2013-08-08 LAB — BASIC METABOLIC PANEL
Calcium: 9.5 mg/dL (ref 8.4–10.5)
Creat: 0.81 mg/dL (ref 0.50–1.10)
Sodium: 138 mEq/L (ref 135–145)

## 2013-08-09 ENCOUNTER — Encounter: Payer: Self-pay | Admitting: Family Medicine

## 2013-08-09 LAB — VITAMIN B12: Vitamin B-12: 685 pg/mL (ref 211–911)

## 2013-08-09 LAB — VITAMIN D 25 HYDROXY (VIT D DEFICIENCY, FRACTURES): Vit D, 25-Hydroxy: 53 ng/mL (ref 30–89)

## 2013-09-19 ENCOUNTER — Ambulatory Visit: Payer: Medicare Other | Admitting: Podiatrist

## 2013-10-11 ENCOUNTER — Ambulatory Visit: Payer: Medicare Other | Admitting: Podiatrist

## 2013-10-29 ENCOUNTER — Other Ambulatory Visit: Payer: Self-pay

## 2013-10-29 DIAGNOSIS — Z1231 Encounter for screening mammogram for malignant neoplasm of breast: Secondary | ICD-10-CM

## 2013-10-31 ENCOUNTER — Ambulatory Visit: Payer: Medicare Other

## 2013-11-20 ENCOUNTER — Ambulatory Visit: Payer: Medicare Other | Admitting: Family Medicine

## 2013-11-27 ENCOUNTER — Ambulatory Visit: Payer: Medicare Other | Admitting: Family Medicine

## 2013-11-28 ENCOUNTER — Ambulatory Visit (INDEPENDENT_AMBULATORY_CARE_PROVIDER_SITE_OTHER): Payer: Medicare Other | Admitting: Family Medicine

## 2013-11-28 ENCOUNTER — Encounter: Payer: Self-pay | Admitting: Family Medicine

## 2013-11-28 VITALS — BP 112/70 | Ht 62.0 in | Wt 141.8 lb

## 2013-11-28 DIAGNOSIS — L4050 Arthropathic psoriasis, unspecified: Secondary | ICD-10-CM

## 2013-11-28 DIAGNOSIS — E785 Hyperlipidemia, unspecified: Secondary | ICD-10-CM

## 2013-11-28 NOTE — Progress Notes (Signed)
   Subjective:    Patient ID: [name], female    DOB: [birth year], 68 y.o.   MRN: [id number]  HPI Patient arrives for follow up- [place] She does have some difficulty with heavy lifting but otherwise is very active. In addition to this takes her medicine as directed denies any particular problems currently she had cholesterol done a few months ago she wonders if it needs to be repeated again currently  Review of Systems Positive for psoriasis negative for chest pain shortness of breath denies behavioral problems    Objective:   Physical Exam Lungs clear heart regular pulse normal blood pressure recheck good extremities no edema       Assessment & Plan:  #1 psoriatic arthritis continue methotrexate under the care of the specialist doing well overall #2 hyperlipidemia I reviewed over her numbers she rates at a very low risk of heart disease no need for statins, no need to repeat it until mid summer followup at that time #3 patient is 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

## 2013-11-29 ENCOUNTER — Ambulatory Visit: Payer: Medicare Other

## 2013-12-18 ENCOUNTER — Ambulatory Visit
Admission: RE | Admit: 2013-12-18 | Discharge: 2013-12-18 | Disposition: A | Discharge status: Home / Self Care | Payer: Medicare Other | Source: Ambulatory Visit

## 2013-12-18 DIAGNOSIS — Z1231 Encounter for screening mammogram for malignant neoplasm of breast: Secondary | ICD-10-CM

## 2014-06-07 ENCOUNTER — Telehealth: Payer: Self-pay | Admitting: Family Medicine

## 2014-06-07 DIAGNOSIS — G4700 Insomnia, unspecified: Secondary | ICD-10-CM

## 2014-06-07 NOTE — Telephone Encounter (Signed)
It is possible pt may have sleep apnea and abnormal restless legs. Refer for sleep study with Dr Gerilyn Pilgrim please

## 2014-06-07 NOTE — Telephone Encounter (Signed)
Patient says that she has previously discussed with Dr. Lorin Picket her concerns about not being able to sleep. She has tried Melatonin with no help. She wants to know if we can set her up with a sleep study?

## 2014-06-11 NOTE — Telephone Encounter (Signed)
Left message on voicemail notifying patient that referral for sleep study has been initiated.

## 2014-06-21 ENCOUNTER — Other Ambulatory Visit (HOSPITAL_COMMUNITY): Payer: Self-pay | Admitting: Respiratory Therapy

## 2014-07-08 ENCOUNTER — Ambulatory Visit: Payer: Medicare Other | Attending: Family Medicine | Admitting: Sleep Medicine

## 2014-07-08 VITALS — Ht 62.0 in | Wt 143.0 lb

## 2014-07-08 DIAGNOSIS — G4733 Obstructive sleep apnea (adult) (pediatric): Secondary | ICD-10-CM

## 2014-07-08 DIAGNOSIS — Z79899 Other long term (current) drug therapy: Secondary | ICD-10-CM | POA: Diagnosis not present

## 2014-07-08 DIAGNOSIS — R0683 Snoring: Secondary | ICD-10-CM | POA: Diagnosis not present

## 2014-07-08 DIAGNOSIS — G4710 Hypersomnia, unspecified: Secondary | ICD-10-CM | POA: Insufficient documentation

## 2014-07-08 DIAGNOSIS — R5383 Other fatigue: Secondary | ICD-10-CM | POA: Insufficient documentation

## 2014-07-13 NOTE — Sleep Study (Signed)
  [facility] Emily Ambers A. Gerilyn Pilgrimoonquah, MD     [email or web address]        NOCTURNAL POLYSOMNOGRAM    LOCATION: SLEEP LAB FACILITY: [facility]   PHYSICIAN: Theoren Palka A. Gerilyn Fields, M.D.   DATE OF STUDY: 07/08/2014.   REFERRING PHYSICIAN: [name].   INDICATIONS: The patient is a 68 year old presents with hypersomnia, fatigue and snoring.  MEDICATIONS:  Prior to Admission medications   Medication Sig Start Date End Date Taking? Authorizing Provider  Calcium Carbonate-Vit D-Min (CALCIUM 1200 PO) Take by mouth.    Historical Provider, MD  Cholecalciferol (VITAMIN D-3) 1000 UNITS CAPS Take by mouth.    Historical Provider, MD  clobetasol ointment (TEMOVATE) 0.05 %  11/23/12   Historical Provider, MD  ferrous fumarate (HEMOCYTE - 106 MG FE) 325 (106 FE) MG TABS Take 1 tablet by mouth daily.      Historical Provider, MD  fish oil-omega-3 fatty acids 1000 MG capsule Take 1 g by mouth daily.      Historical Provider, MD  folic acid (FOLVITE) 1 MG tablet Take 1 mg by mouth daily. Take 2 po qd    Historical Provider, MD  methotrexate (RHEUMATREX) 2.5 MG tablet 2.5 mg once a week. Take 6 tablets po every week as directed 12/20/12   Historical Provider, MD  Probiotic Product (PROBIOTIC DAILY PO) Take by mouth.    Historical Provider, MD  Red Yeast Rice 600 MG CAPS Take by mouth.    Historical Provider, MD      EPWORTH SLEEPINESS SCALE: 11.   BMI: 26.   ARCHITECTURAL SUMMARY: Total recording time was 395 minutes. Sleep efficiency 70 %. Sleep latency 11 minutes. REM latency 77 minutes. Stage NI 6 %, N2 77 % and N3 11 % and REM sleep 6 %.    RESPIRATORY DATA:  Baseline oxygen saturation is 98 %. The lowest saturation is 92 %. The diagnostic AHI is 1. The RDI is 2. The REM AHI is 21.  LIMB MOVEMENT SUMMARY: PLM index 4.   ELECTROCARDIOGRAM SUMMARY: Average heart rate is 46 with no significant dysrhythmias observed.   IMPRESSION:  1. Unremarkable nocturnal polysomnography.  Thanks for  this referral.  [name], M.D. Diplomat, Biomedical engineerAmerican Board of Sleep Medicine.

## 2014-12-04 LAB — HM DIABETES EYE EXAM

## 2014-12-13 ENCOUNTER — Telehealth: Payer: Self-pay | Admitting: Family Medicine

## 2014-12-13 DIAGNOSIS — Z79899 Other long term (current) drug therapy: Secondary | ICD-10-CM

## 2014-12-13 DIAGNOSIS — R6889 Other general symptoms and signs: Secondary | ICD-10-CM

## 2014-12-13 DIAGNOSIS — E785 Hyperlipidemia, unspecified: Secondary | ICD-10-CM

## 2014-12-13 DIAGNOSIS — D649 Anemia, unspecified: Secondary | ICD-10-CM

## 2014-12-13 NOTE — Telephone Encounter (Signed)
Patient scheduled an appointment for 12/27/14 for a check up and concerned about high levels of copper in her water that she recently discovered.  She wants to know if Dr. Lorin PicketScott wants her to have labs done before appointment?

## 2014-12-15 NOTE — Telephone Encounter (Signed)
Copper, ceruloplasin level, also due for CBC,liver,lipid,met 7

## 2014-12-16 NOTE — Telephone Encounter (Signed)
bloodwork orders ready. Pt notified on voicemail.  

## 2014-12-20 LAB — HEPATIC FUNCTION PANEL
ALBUMIN: 4.4 g/dL (ref 3.6–4.8)
ALT: 16 IU/L (ref 0–32)
AST: 18 IU/L (ref 0–40)
Alkaline Phosphatase: 90 IU/L (ref 39–117)
Bilirubin Total: 0.2 mg/dL (ref 0.0–1.2)
Bilirubin, Direct: 0.08 mg/dL (ref 0.00–0.40)
Total Protein: 6.6 g/dL (ref 6.0–8.5)

## 2014-12-20 LAB — BASIC METABOLIC PANEL
BUN/Creatinine Ratio: 11 (ref 11–26)
BUN: 9 mg/dL (ref 8–27)
CO2: 25 mmol/L (ref 18–29)
Calcium: 9.2 mg/dL (ref 8.7–10.3)
Chloride: 103 mmol/L (ref 97–108)
Creatinine, Ser: 0.79 mg/dL (ref 0.57–1.00)
GFR calc Af Amer: 89 mL/min/{1.73_m2} (ref >59)
GFR, EST NON AFRICAN AMERICAN: 77 mL/min/{1.73_m2} (ref >59)
GLUCOSE: 80 mg/dL (ref 65–99)
Potassium: 4.3 mmol/L (ref 3.5–5.2)
Sodium: 142 mmol/L (ref 134–144)

## 2014-12-20 LAB — CBC WITH DIFFERENTIAL/PLATELET
BASOS: 1 %
Basophils Absolute: 0 10*3/uL (ref 0.0–0.2)
Eos: 2 %
Eosinophils Absolute: 0.1 10*3/uL (ref 0.0–0.4)
HEMATOCRIT: 34.8 % (ref 34.0–46.6)
HEMOGLOBIN: 11.4 g/dL (ref 11.1–15.9)
Immature Grans (Abs): 0 10*3/uL (ref 0.0–0.1)
Immature Granulocytes: 0 %
LYMPHS ABS: 2 10*3/uL (ref 0.7–3.1)
Lymphs: 45 %
MCH: 24.5 pg — ABNORMAL LOW (ref 26.6–33.0)
MCHC: 32.8 g/dL (ref 31.5–35.7)
MCV: 75 fL — ABNORMAL LOW (ref 79–97)
MONOCYTES: 7 %
MONOS ABS: 0.3 10*3/uL (ref 0.1–0.9)
NEUTROS PCT: 45 %
Neutrophils Absolute: 2.1 10*3/uL (ref 1.4–7.0)
Platelets: 213 10*3/uL (ref 150–379)
RBC: 4.66 x10E6/uL (ref 3.77–5.28)
RDW: 17.3 % — AB (ref 12.3–15.4)
WBC: 4.4 10*3/uL (ref 3.4–10.8)

## 2014-12-20 LAB — LIPID PANEL
CHOLESTEROL TOTAL: 235 mg/dL — AB (ref 100–199)
Chol/HDL Ratio: 3.5 ratio units (ref 0.0–4.4)
HDL: 68 mg/dL (ref >39)
LDL CALC: 144 mg/dL — AB (ref 0–99)
Triglycerides: 113 mg/dL (ref 0–149)
VLDL Cholesterol Cal: 23 mg/dL (ref 5–40)

## 2014-12-20 LAB — CERULOPLASMIN: CERULOPLASMIN: 25.9 mg/dL (ref 19.0–39.0)

## 2014-12-20 LAB — COPPER, SERUM: COPPER: 115 ug/dL (ref 72–166)

## 2014-12-27 ENCOUNTER — Ambulatory Visit (INDEPENDENT_AMBULATORY_CARE_PROVIDER_SITE_OTHER): Payer: Medicare Other | Admitting: Family Medicine

## 2014-12-27 ENCOUNTER — Encounter: Payer: Self-pay | Admitting: Family Medicine

## 2014-12-27 VITALS — BP 118/78 | Ht 62.0 in | Wt 153.0 lb

## 2014-12-27 DIAGNOSIS — L4050 Arthropathic psoriasis, unspecified: Secondary | ICD-10-CM | POA: Insufficient documentation

## 2014-12-27 DIAGNOSIS — E785 Hyperlipidemia, unspecified: Secondary | ICD-10-CM | POA: Diagnosis not present

## 2014-12-27 DIAGNOSIS — Z23 Encounter for immunization: Secondary | ICD-10-CM | POA: Diagnosis not present

## 2014-12-27 NOTE — Progress Notes (Signed)
   Subjective:    Patient ID: Emily SicksCatherine G Endicott, female    DOB: [birth year], 69 y.o.   MRN: [id number]  Hyperlipidemia This is a chronic problem. The current episode started more than 1 year ago. Pertinent negatives include no chest pain. Compliance problems include adherence to diet (walks three times a week).   pt had water tested and it had high levels of copper. Bloodtest was done 3/16.  Patient had a lot of persistent nausea she notices her dish towels were turning blue green she talked with a environmental company who told her she probably had high and there was high levels of Copper   Review of Systems  Constitutional: Negative for activity change, appetite change and fatigue.  HENT: Negative for congestion.   Respiratory: Negative for cough.   Cardiovascular: Negative for chest pain.  Gastrointestinal: Negative for abdominal pain.  Endocrine: Negative for polydipsia and polyphagia.  Neurological: Negative for weakness.  Psychiatric/Behavioral: Negative for confusion.       Objective:   Physical Exam  Constitutional: She appears well-nourished. No distress.  Cardiovascular: Normal rate, regular rhythm and normal heart sounds.   No murmur heard. Pulmonary/Chest: Effort normal and breath sounds normal. No respiratory distress.  Musculoskeletal: She exhibits no edema.  Lymphadenopathy:    She has no cervical adenopathy.  Neurological: She is alert. She exhibits normal muscle tone.  Psychiatric: Her behavior is normal.  Vitals reviewed.         Assessment & Plan:  The patient will resume her red rice yeast extract to help treat cholesterol issues. She has not tolerated statins  Discussed 81 mg aspirin patient will consider  Copper levels normal patient now has filter on her water  Patient relates taking her methotrexate for her psoriatic arthritis. She is not due for colonoscopy currently  Follow-up 6 months

## 2014-12-27 NOTE — Patient Instructions (Signed)
DASH Eating Plan °DASH stands for "Dietary Approaches to Stop Hypertension." The DASH eating plan is a healthy eating plan that has been shown to reduce high blood pressure (hypertension). Additional health benefits may include reducing the risk of type 2 diabetes mellitus, heart disease, and stroke. The DASH eating plan may also help with weight loss. °WHAT DO I NEED TO KNOW ABOUT THE DASH EATING PLAN? °For the DASH eating plan, you will follow these general guidelines: °· Choose foods with a percent daily value for sodium of less than 5% (as listed on the food label). °· Use salt-free seasonings or herbs instead of table salt or sea salt. °· Check with your health care provider or pharmacist before using salt substitutes. °· Eat lower-sodium products, often labeled as "lower sodium" or "no salt added." °· Eat fresh foods. °· Eat more vegetables, fruits, and low-fat dairy products. °· Choose whole grains. Look for the word "whole" as the first word in the ingredient list. °· Choose fish and skinless chicken or turkey more often than red meat. Limit fish, poultry, and meat to 6 oz (170 g) each day. °· Limit sweets, desserts, sugars, and sugary drinks. °· Choose heart-healthy fats. °· Limit cheese to 1 oz (28 g) per day. °· Eat more home-cooked food and less restaurant, buffet, and fast food. °· Limit fried foods. °· Cook foods using methods other than frying. °· Limit canned vegetables. If you do use them, rinse them well to decrease the sodium. °· When eating at a restaurant, ask that your food be prepared with less salt, or no salt if possible. °WHAT FOODS CAN I EAT? °Seek help from a dietitian for individual calorie needs. °Grains °Whole grain or whole wheat bread. Brown rice. Whole grain or whole wheat pasta. Quinoa, bulgur, and whole grain cereals. Low-sodium cereals. Corn or whole wheat flour tortillas. Whole grain cornbread. Whole grain crackers. Low-sodium crackers. °Vegetables °Fresh or frozen vegetables  (raw, steamed, roasted, or grilled). Low-sodium or reduced-sodium tomato and vegetable juices. Low-sodium or reduced-sodium tomato sauce and paste. Low-sodium or reduced-sodium canned vegetables.  °Fruits °All fresh, canned (in natural juice), or frozen fruits. °Meat and Other Protein Products °Ground beef (85% or leaner), grass-fed beef, or beef trimmed of fat. Skinless chicken or turkey. Ground chicken or turkey. Pork trimmed of fat. All fish and seafood. Eggs. Dried beans, peas, or lentils. Unsalted nuts and seeds. Unsalted canned beans. °Dairy °Low-fat dairy products, such as skim or 1% milk, 2% or reduced-fat cheeses, low-fat ricotta or cottage cheese, or plain low-fat yogurt. Low-sodium or reduced-sodium cheeses. °Fats and Oils °Tub margarines without trans fats. Light or reduced-fat mayonnaise and salad dressings (reduced sodium). Avocado. Safflower, olive, or canola oils. Natural peanut or almond butter. °Other °Unsalted popcorn and pretzels. °The items listed above may not be a complete list of recommended foods or beverages. Contact your dietitian for more options. °WHAT FOODS ARE NOT RECOMMENDED? °Grains °White bread. White pasta. White rice. Refined cornbread. Bagels and croissants. Crackers that contain trans fat. °Vegetables °Creamed or fried vegetables. Vegetables in a cheese sauce. Regular canned vegetables. Regular canned tomato sauce and paste. Regular tomato and vegetable juices. °Fruits °Dried fruits. Canned fruit in light or heavy syrup. Fruit juice. °Meat and Other Protein Products °Fatty cuts of meat. Ribs, chicken wings, bacon, sausage, bologna, salami, chitterlings, fatback, hot dogs, bratwurst, and packaged luncheon meats. Salted nuts and seeds. Canned beans with salt. °Dairy °Whole or 2% milk, cream, half-and-half, and cream cheese. Whole-fat or sweetened yogurt. Full-fat   cheeses or blue cheese. Nondairy creamers and whipped toppings. Processed cheese, cheese spreads, or cheese  curds. °Condiments °Onion and garlic salt, seasoned salt, table salt, and sea salt. Canned and packaged gravies. Worcestershire sauce. Tartar sauce. Barbecue sauce. Teriyaki sauce. Soy sauce, including reduced sodium. Steak sauce. Fish sauce. Oyster sauce. Cocktail sauce. Horseradish. Ketchup and mustard. Meat flavorings and tenderizers. Bouillon cubes. Hot sauce. Tabasco sauce. Marinades. Taco seasonings. Relishes. °Fats and Oils °Butter, stick margarine, lard, shortening, ghee, and bacon fat. Coconut, palm kernel, or palm oils. Regular salad dressings. °Other °Pickles and olives. Salted popcorn and pretzels. °The items listed above may not be a complete list of foods and beverages to avoid. Contact your dietitian for more information. °WHERE CAN I FIND MORE INFORMATION? °National Heart, Lung, and Blood Institute: www.nhlbi.nih.gov/health/health-topics/topics/dash/ °Document Released: 09/09/2011 Document Revised: 02/04/2014 Document Reviewed: 07/25/2013 °ExitCare® Patient Information ©2015 [employer]. This information is not intended to replace advice given to you by your health care provider. Make sure you discuss any questions you have with your health care provider. ° °

## 2015-01-23 ENCOUNTER — Encounter: Payer: Self-pay | Admitting: *Deleted

## 2015-10-24 ENCOUNTER — Other Ambulatory Visit: Payer: Self-pay | Admitting: Rheumatology

## 2015-10-24 DIAGNOSIS — M8580 Other specified disorders of bone density and structure, unspecified site: Secondary | ICD-10-CM

## 2015-12-01 ENCOUNTER — Ambulatory Visit
Admission: RE | Admit: 2015-12-01 | Discharge: 2015-12-01 | Disposition: A | Discharge status: Home / Self Care | Payer: Medicare Other | Source: Ambulatory Visit | Attending: Rheumatology | Admitting: Rheumatology

## 2015-12-01 DIAGNOSIS — M8580 Other specified disorders of bone density and structure, unspecified site: Secondary | ICD-10-CM

## 2016-03-30 ENCOUNTER — Encounter: Payer: Self-pay | Admitting: Family Medicine

## 2016-03-30 ENCOUNTER — Ambulatory Visit (INDEPENDENT_AMBULATORY_CARE_PROVIDER_SITE_OTHER): Payer: Medicare Other | Admitting: Family Medicine

## 2016-03-30 VITALS — BP 110/70 | Ht 62.0 in | Wt 151.2 lb

## 2016-03-30 DIAGNOSIS — E785 Hyperlipidemia, unspecified: Secondary | ICD-10-CM | POA: Diagnosis not present

## 2016-03-30 DIAGNOSIS — L4050 Arthropathic psoriasis, unspecified: Secondary | ICD-10-CM

## 2016-03-30 DIAGNOSIS — M8580 Other specified disorders of bone density and structure, unspecified site: Secondary | ICD-10-CM

## 2016-03-30 DIAGNOSIS — Z23 Encounter for immunization: Secondary | ICD-10-CM | POA: Diagnosis not present

## 2016-03-30 NOTE — Progress Notes (Signed)
   Subjective:    Patient ID: [name], female    DOB: 12-12-1945, 70 y.o.   MRN: [id number]  Hyperlipidemia This is a chronic problem. The current episode started more than 1 year ago. There are no known factors aggravating her hyperlipidemia. Current antihyperlipidemic treatment includes diet change. The current treatment provides moderate improvement of lipids. There are no compliance problems.  There are no known risk factors for coronary artery disease.   Patient states that she has no concerns at this time.  Patient does have psoriatic arthritis sees a specialist for this. Patient up-to-date on colonoscopy but is not highly interested in having any aggressive preventative measures  Review of Systems  Constitutional: Negative for activity change and appetite change.  Gastrointestinal: Negative for vomiting and abdominal pain.  Musculoskeletal: Positive for back pain and arthralgias.  Neurological: Negative for weakness.  Psychiatric/Behavioral: Negative for confusion.       Objective:   Physical Exam  Constitutional: She appears well-nourished. No distress.  Cardiovascular: Normal rate, regular rhythm and normal heart sounds.   No murmur heard. Pulmonary/Chest: Effort normal and breath sounds normal. No respiratory distress.  Musculoskeletal: She exhibits no edema.  Lymphadenopathy:    She has no cervical adenopathy.  Neurological: She is alert. She exhibits normal muscle tone.  Psychiatric: Her behavior is normal.  Vitals reviewed.         Assessment & Plan:  Hyperlipidemia this patient does not want to be on any cholesterol medicines. She does state sure use red rice yeast extract and watch her diet try to exercise on a regular basis  Preventative measures discuss in detail patient up-to-date on colonoscopy Safety dietary reinforced Mammogram recommended yearly but at the very least every 2 years

## 2016-04-08 ENCOUNTER — Other Ambulatory Visit: Payer: Self-pay | Admitting: Family Medicine

## 2016-04-09 LAB — LIPID PANEL
Cholesterol: 224 mg/dL — ABNORMAL HIGH (ref 125–200)
HDL: 74 mg/dL (ref >=46)
LDL Cholesterol: 136 mg/dL — ABNORMAL HIGH (ref <130)
Total CHOL/HDL Ratio: 3 Ratio (ref <=5.0)
Triglycerides: 71 mg/dL (ref <150)
VLDL: 14 mg/dL (ref <30)

## 2016-04-11 ENCOUNTER — Encounter: Payer: Self-pay | Admitting: Family Medicine

## 2016-06-30 ENCOUNTER — Emergency Department (HOSPITAL_COMMUNITY)
Admission: EM | Admit: 2016-06-30 | Discharge: 2016-06-30 | Disposition: A | Discharge status: Home / Self Care | Payer: Medicare Other | Attending: Emergency Medicine | Admitting: Emergency Medicine

## 2016-06-30 ENCOUNTER — Emergency Department (HOSPITAL_COMMUNITY): Payer: Medicare Other

## 2016-06-30 ENCOUNTER — Encounter (HOSPITAL_COMMUNITY): Payer: Self-pay

## 2016-06-30 DIAGNOSIS — W2209XA Striking against other stationary object, initial encounter: Secondary | ICD-10-CM | POA: Diagnosis not present

## 2016-06-30 DIAGNOSIS — Y999 Unspecified external cause status: Secondary | ICD-10-CM | POA: Insufficient documentation

## 2016-06-30 DIAGNOSIS — S99912A Unspecified injury of left ankle, initial encounter: Secondary | ICD-10-CM | POA: Diagnosis present

## 2016-06-30 DIAGNOSIS — Y9301 Activity, walking, marching and hiking: Secondary | ICD-10-CM | POA: Diagnosis not present

## 2016-06-30 DIAGNOSIS — Y929 Unspecified place or not applicable: Secondary | ICD-10-CM | POA: Diagnosis not present

## 2016-06-30 DIAGNOSIS — S93402A Sprain of unspecified ligament of left ankle, initial encounter: Secondary | ICD-10-CM | POA: Insufficient documentation

## 2016-06-30 NOTE — ED Triage Notes (Signed)
Complains of left foot pain between ankle and heel. Stated this morning. Denies injury.

## 2016-06-30 NOTE — ED Notes (Signed)
Patient states she was walking this morning and hit her left ankle on a rocky surface. States "I came home and was ok and then it started hurting."

## 2016-06-30 NOTE — ED Provider Notes (Signed)
[facility] Provider Note   CSN: [id number] Arrival date & time: 06/30/16  1239     History   Chief Complaint Chief Complaint  Patient presents with  . Foot Injury    HPI [name] is a 70 y.o. female.  The history is provided by the patient. No language interpreter was used.  Foot Injury   The incident occurred 6 to 12 hours ago. The incident occurred at home. There was no injury mechanism. The pain location is generalized. The quality of the pain is described as aching. The pain is moderate. The pain has been constant since onset. She reports no foreign bodies present. Nothing aggravates the symptoms. She has tried nothing for the symptoms. The treatment provided moderate relief.  Pt complains of pain in her left ankle.  Pt reports [facility] [place] [facility]  Pt reports she did hit area while walking  Past Medical History:  Diagnosis Date  . Hx of colonic polyps   . Hypercholesterolemia   . Psoriatic arthritis [facility])     Patient Active Problem List   Diagnosis Date Noted  . Psoriatic arthritis (HCC) 12/27/2014  . Unspecified vitamin D deficiency 08/06/2013  . Osteopenia 08/06/2013  . Insomnia 02/22/2013  . Hyperlipidemia 01/09/2013  . Anemia 09/12/2012  . Toe cyanosis 08/08/2012  . OSTEOARTHRITIS, SHOULDER, RIGHT 09/15/2010  . ADHESIVE CAPSULITIS, LEFT 07/07/2009  . SHOULDER PAIN 12/18/2008  . IMPINGEMENT SYNDROME 12/18/2008    Past Surgical History:  Procedure Laterality Date  . ABDOMINAL HYSTERECTOMY    . COLONOSCOPY  08/25/2011   Procedure: COLONOSCOPY;  Surgeon: Malissa Hippo, MD;  Location: [facility];  Service: Endoscopy;  Laterality: N/A;  12:00  . left knee stable out  1966  . left knee staple in  1962  . VESICOVAGINAL FISTULA CLOSURE W/ TAH  1986    OB History    No data available       Home Medications    Prior to Admission medications   Medication Sig Start Date End Date Taking? Authorizing Provider    Calcium Carbonate-Vit D-Min (CALCIUM 1200 PO) Take by mouth.    Historical Provider, MD  Cholecalciferol (VITAMIN D-3) 1000 UNITS CAPS Take by mouth.    Historical Provider, MD  clobetasol ointment (TEMOVATE) 0.05 %  11/23/12   Historical Provider, MD  ferrous fumarate (HEMOCYTE - 106 MG FE) 325 (106 FE) MG TABS Take 1 tablet by mouth daily.      Historical Provider, MD  fish oil-omega-3 fatty acids 1000 MG capsule Take 1 g by mouth daily.      Historical Provider, MD  folic acid (FOLVITE) 1 MG tablet Take 1 mg by mouth daily. Take 2 po qd    Historical Provider, MD  methotrexate (RHEUMATREX) 2.5 MG tablet 2.5 mg once a week. Take 7 tablets po every week as directed 12/20/12   Historical Provider, MD  Probiotic Product (PROBIOTIC DAILY PO) Take by mouth.    Historical Provider, MD  Red Yeast Rice 600 MG CAPS Take by mouth.    Historical Provider, MD  tacrolimus (PROTOPIC) 0.1 % ointment  11/25/14   Historical Provider, MD    Family History Family History  Problem Relation Age of Onset  . Anesthesia problems Mother   . Cancer Mother   . Hypertension Mother   . Cancer Father   . Diabetes Father   . Deep vein thrombosis Father   . Hypertension Father   . Arthritis  FH  . Diabetes      FH  . Cancer      FH  . Hypertension Sister   . Hypertension Brother   . Heart disease Brother   . Hypotension Neg Hx   . Malignant hyperthermia Neg Hx   . Pseudochol deficiency Neg Hx     Social History Social History  Substance Use Topics  . Smoking status: Never Smoker  . Smokeless tobacco: Never Used  . Alcohol use No     Allergies   Etodolac   Review of Systems Review of Systems  All other systems reviewed and are negative.    Physical Exam Updated Vital Signs BP 133/76 (BP Location: Left Arm)   Pulse 72   Temp 98.5 F (36.9 C) (Oral)   Resp 16   Ht 5\' 2"  (1.575 m)   Wt 68 kg   SpO2 100%   BMI 27.44 kg/m   Physical Exam  Constitutional: She is oriented to person,  place, and time. She appears well-developed and well-nourished.  HENT:  Head: Normocephalic.  Eyes: EOM are normal.  Neck: Normal range of motion.  Pulmonary/Chest: Effort normal.  Abdominal: She exhibits no distension.  Musculoskeletal: Normal range of motion. She exhibits tenderness.  Tender lateral ankle and foot, tender malleolus  Neurological: She is alert and oriented to person, place, and time.  Psychiatric: She has a normal mood and affect.  Nursing note and vitals reviewed.    ED Treatments / Results  Labs (all labs ordered are listed, but only abnormal results are displayed) Labs Reviewed - No data to display  EKG  EKG Interpretation None       Radiology Dg Foot Complete Left  Result Date: 06/30/2016 CLINICAL DATA:  Whole left foot pain laterally. EXAM: LEFT FOOT - COMPLETE 3+ VIEW COMPARISON:  None. FINDINGS: There is no evidence of fracture or dislocation. There is mild osteoarthritis of the first TMT joint first MTP joint. There is a small plantar calcaneal spur. The the there is mild osteoarthritis of the talonavicular joint. There is ankylosis of the second, third, fourth and fifth PIP joints. There is relative pes planus. Soft tissues are unremarkable. IMPRESSION: No acute osseous injury of the left foot. Electronically Signed   By: Elige KoHetal  Patel   On: 06/30/2016 13:17    Procedures Procedures (including critical care time)  Medications Ordered in ED Medications - No data to display   Initial Impression / Assessment and Plan / ED Course  I have reviewed the triage vital signs and the nursing notes.  Pertinent labs & imaging results that were available during my care of the patient were reviewed by me and considered in my medical decision making (see chart for details).  Clinical Course    ASO Crutches Follow up with Dr. Romeo AppleHarrison for recheck in 1 week if pain persist  Final Clinical Impressions(s) / ED Diagnoses   Final diagnoses:  Ankle sprain,  left, initial encounter    New Prescriptions New Prescriptions   No medications on file     Elson AreasLeslie K Suzanne Kho, PA-C 06/30/16 1404    Linwood DibblesJon Knapp, MD 07/02/16 [phone]

## 2016-07-08 ENCOUNTER — Other Ambulatory Visit: Payer: Self-pay | Admitting: Family Medicine

## 2016-07-08 ENCOUNTER — Other Ambulatory Visit (HOSPITAL_COMMUNITY): Payer: Self-pay | Admitting: Diagnostic Radiology

## 2016-07-08 DIAGNOSIS — Z1231 Encounter for screening mammogram for malignant neoplasm of breast: Secondary | ICD-10-CM

## 2016-07-27 ENCOUNTER — Ambulatory Visit: Payer: Medicare Other | Admitting: Rheumatology

## 2016-07-27 ENCOUNTER — Telehealth: Payer: Self-pay | Admitting: Radiology

## 2016-07-27 NOTE — Telephone Encounter (Signed)
Called patient to advise labs are normal they can be accessed via [facility] lab computer/ CBC CMP

## 2016-08-08 NOTE — Progress Notes (Deleted)
*  IMAGE* Office Visit Note  Patient: [name]             Date of Birth: 01-Dec-1945           MRN: [id number]             PCP: [name], MD Referring: Babs SciaraLuking, Scott A, MD Visit Date: 08/11/2016 Occupation:@GUAROCC @    Subjective:  No chief complaint on file.   History of Present Illness: [name] is a 70 y.o. female ***   Activities of Daily Living:  Patient reports morning stiffness for *** {minute/hour:19697}.   Patient {ACTIONS;DENIES/REPORTS:21021675::"Denies"} nocturnal pain.  Difficulty dressing/grooming: {ACTIONS;DENIES/REPORTS:21021675::"Denies"} Difficulty climbing stairs: {ACTIONS;DENIES/REPORTS:21021675::"Denies"} Difficulty getting out of chair: {ACTIONS;DENIES/REPORTS:21021675::"Denies"} Difficulty using hands for taps, buttons, cutlery, and/or writing: {ACTIONS;DENIES/REPORTS:21021675::"Denies"}   No Rheumatology ROS completed.   PMFS History:  Patient Active Problem List   Diagnosis Date Noted  . Psoriatic arthritis (HCC) 12/27/2014  . Unspecified vitamin D deficiency 08/06/2013  . Osteopenia 08/06/2013  . Insomnia 02/22/2013  . Hyperlipidemia 01/09/2013  . Anemia 09/12/2012  . Toe cyanosis 08/08/2012  . OSTEOARTHRITIS, SHOULDER, RIGHT 09/15/2010  . ADHESIVE CAPSULITIS, LEFT 07/07/2009  . SHOULDER PAIN 12/18/2008  . IMPINGEMENT SYNDROME 12/18/2008    Past Medical History:  Diagnosis Date  . Hx of colonic polyps   . Hypercholesterolemia   . Psoriatic arthritis (HCC)     Family History  Problem Relation Age of Onset  . Anesthesia problems Mother   . Cancer Mother   . Hypertension Mother   . Cancer Father   . Diabetes Father   . Deep vein thrombosis Father   . Hypertension Father   . Arthritis      FH  . Diabetes      FH  . Cancer      FH  . Hypertension Sister   . Hypertension Brother   . Heart disease Brother   . Hypotension Neg Hx   . Malignant hyperthermia Neg Hx   . Pseudochol deficiency Neg Hx    Past  Surgical History:  Procedure Laterality Date  . ABDOMINAL HYSTERECTOMY    . COLONOSCOPY  08/25/2011   Procedure: COLONOSCOPY;  Surgeon: Malissa HippoNajeeb U Rehman, MD;  Location: [facility];  Service: Endoscopy;  Laterality: N/A;  12:00  . left knee stable out  1966  . left knee staple in  1962  . VESICOVAGINAL FISTULA CLOSURE W/ TAH  1986   Social History   Social History Narrative  . No narrative on file     Objective: Vital Signs: There were no vitals taken for this visit.   Physical Exam   Musculoskeletal Exam: ***  CDAI Exam: No CDAI exam completed.    Investigation: No additional findings.   Imaging: No results found.  Speciality Comments: No specialty comments available.    Procedures:  No procedures performed Allergies: Etodolac   Assessment / Plan: Visit Diagnoses: No diagnosis found.    Orders: No orders of the defined types were placed in this encounter.  No orders of the defined types were placed in this encounter.   Face-to-face time spent with patient was *** minutes. 50% of time was spent in counseling and coordination of care.  Follow-Up Instructions: No Follow-up on file.

## 2016-08-11 ENCOUNTER — Ambulatory Visit: Payer: Medicare Other | Admitting: Rheumatology

## 2016-08-31 NOTE — Progress Notes (Signed)
Office Visit Note  Patient: [name]             Date of Birth: 26-Dec-1945           MRN: [id number]             PCP: [name], MD Referring: Babs SciaraLuking, Scott A, MD Visit Date: 09/01/2016 Occupation: @GUAROCC @    Subjective:  No chief complaint on file. Follow-up on psoriatic arthritis, psoriasis, high risk prescription.  History of Present Illness: [name] is a 70 y.o. female  Last seen 03/19/2016 Doing well with assorted arthritis. No joint pain swelling and stiffness Patient held off on her methotrexate at 7 per week 2 weeks prior to her shingles injection.  She did well with the injection and then she restarted her methotrexate couple of weeks later.  Patient is having trouble with some psoriasis lesions going on to her knees. She does not use clobetasol like she should. She forgets to take it.  Her last labs are from October 2017 and there were close to normal limits. Her new labs will be due January 2018.  Patient was offered alternative medication to methotrexate to better address her psoriasis but she is not interested in it at this time. We will see her back in 4 months and see how well she is doing with the psoriasis.     Activities of Daily Living:  Patient reports morning stiffness for 15 minutes.   Patient Denies nocturnal pain.  Difficulty dressing/grooming: Denies Difficulty climbing stairs: Denies Difficulty getting out of chair: Denies Difficulty using hands for taps, buttons, cutlery, and/or writing: Denies   No Rheumatology ROS completed.   PMFS History:  Patient Active Problem List   Diagnosis Date Noted  . Psoriatic arthritis (HCC) 12/27/2014  . Unspecified vitamin D deficiency 08/06/2013  . Osteopenia 08/06/2013  . Insomnia 02/22/2013  . Hyperlipidemia 01/09/2013  . Anemia 09/12/2012  . Toe cyanosis 08/08/2012  . OSTEOARTHRITIS, SHOULDER, RIGHT 09/15/2010  . ADHESIVE CAPSULITIS, LEFT 07/07/2009  . SHOULDER PAIN  12/18/2008  . IMPINGEMENT SYNDROME 12/18/2008    Past Medical History:  Diagnosis Date  . Hx of colonic polyps   . Hypercholesterolemia   . Psoriatic arthritis (HCC)     Family History  Problem Relation Age of Onset  . Anesthesia problems Mother   . Cancer Mother   . Hypertension Mother   . Cancer Father   . Diabetes Father   . Deep vein thrombosis Father   . Hypertension Father   . Arthritis      FH  . Diabetes      FH  . Cancer      FH  . Hypertension Sister   . Hypertension Brother   . Heart disease Brother   . Migraines Sister   . Migraines Sister   . Hypotension Neg Hx   . Malignant hyperthermia Neg Hx   . Pseudochol deficiency Neg Hx    Past Surgical History:  Procedure Laterality Date  . ABDOMINAL HYSTERECTOMY    . COLONOSCOPY  08/25/2011   Procedure: COLONOSCOPY;  Surgeon: Malissa HippoNajeeb U Rehman, MD;  Location: [facility];  Service: Endoscopy;  Laterality: N/A;  12:00  . left knee stable out  1966  . left knee staple in  1962  . VESICOVAGINAL FISTULA CLOSURE W/ TAH  1986   Social History   Social History Narrative  . No narrative on file     Objective: Vital Signs: BP 109/63 (BP Location: Left  Arm, Patient Position: Sitting, Cuff Size: Large)   Pulse (!) 59   Resp 14   Ht 5\' 2"  (1.575 m)   Wt 157 lb (71.2 kg)   BMI 28.72 kg/m    Physical Exam   Musculoskeletal Exam:  Full range of motion of all joints Grip strength is equal and strong bilaterally For myalgia tender points are all absent  CDAI Exam: No CDAI exam completed.  No synovitis on examination except for bilateral second CMC with synovial thickening.  Investigation: No additional findings. October 2017 labs shows CBC with differential within normal limits CMP with GFR normal.  Imaging: No results found.  Speciality Comments: No specialty comments available.    Procedures:  No procedures performed Allergies: Etodolac   Assessment / Plan:     Visit Diagnoses: Psoriatic  arthropathy (HCC) - Plan: CBC with Differential/Platelet, Comprehensive metabolic panel  Other psoriasis  High risk medications (not anticoagulants) long-term use - Plan: CBC with Differential/Platelet, Comprehensive metabolic panel  Osteopenia, unspecified location - February 2017 showed a T score of -1.6 in the left forearm radius.;Some will be due March 2019.   Patient's labs were obtained through [facility] on 07/23/2016: CMP with GFR is normal CBC with differential is normal except for slightly low hemoglobin MCV, MCH. These are all close to normal limits and nothing needs to be done about it at this time. We will monitor. RDW was elevated to 16.1 which we will monitor.  The next labs will be due January 2018 and I'll write order for that Refill methotrexate 7 per week ninety-day supply Refill folic acid 2 every day 90 day supply with 4 refills Return to clinic in 4 months Patient does have psoriasis to bilateral knees and she is not unhappy with it. She rather stick with methotrexate which is addressing her joint pain swelling and stiffness very well but is not addressing her psoriasis as well as we would like to. She prefers to stay on methotrexate and not add any Humira or Enbrel at this time. We will monitor the patient carefully and if she wishes to change in the future we will accommodate her if the need is there.  She will be due for repeat bone density March 2019. Her last bone density was fibroid 2017 which showed a T score of -1.6 to the left forearm radius Orders: Orders Placed This Encounter  Procedures  . CBC with Differential/Platelet  . Comprehensive metabolic panel   Meds ordered this encounter  Medications  . folic acid (FOLVITE) 1 MG tablet    Sig: Take 2 tablets (2 mg total) by mouth daily.    Dispense:  180 tablet    Refill:  4    Order Specific Question:   Supervising Provider    Answer:   Pollyann SavoyEVESHWAR, SHAILI [[id number]]  . methotrexate (RHEUMATREX) 2.5 MG tablet     Sig: Take 7 tablets (17.5 mg total) by mouth once a week.    Dispense:  28 tablet    Refill:  2    Order Specific Question:   Supervising Provider    Answer:   Pollyann SavoyEVESHWAR, SHAILI [phone][[id number]]    Face-to-face time spent with patient was 30 minutes. 50% of time was spent in counseling and coordination of care.  Follow-Up Instructions: Return in about 4 months (around 12/30/2016) for PsA,Ps-moderately controlled, mtx 7/week, folic 2qd,.   Tawni PummelNaitik Jayden Kratochvil, PA-C I examined and evaluated the patient with Tawni PummelNaitik Tatyana Biber PA. The plan of care was discussed as noted above.  Janalyn RouseShaili  Corliss Skains, MD

## 2016-09-01 ENCOUNTER — Encounter: Payer: Self-pay | Admitting: Rheumatology

## 2016-09-01 ENCOUNTER — Ambulatory Visit (INDEPENDENT_AMBULATORY_CARE_PROVIDER_SITE_OTHER): Payer: Medicare Other | Admitting: Rheumatology

## 2016-09-01 VITALS — BP 109/63 | HR 59 | Resp 14 | Ht 62.0 in | Wt 157.0 lb

## 2016-09-01 DIAGNOSIS — L4050 Arthropathic psoriasis, unspecified: Secondary | ICD-10-CM | POA: Diagnosis not present

## 2016-09-01 DIAGNOSIS — L408 Other psoriasis: Secondary | ICD-10-CM

## 2016-09-01 DIAGNOSIS — M8580 Other specified disorders of bone density and structure, unspecified site: Secondary | ICD-10-CM

## 2016-09-01 DIAGNOSIS — Z79899 Other long term (current) drug therapy: Secondary | ICD-10-CM | POA: Diagnosis not present

## 2016-09-01 MED ORDER — METHOTREXATE 2.5 MG PO TABS: 17.5000 mg | ORAL_TABLET | ORAL | 2 refills | Status: DC

## 2016-09-01 MED ORDER — FOLIC ACID 1 MG PO TABS: 2.0000 mg | ORAL_TABLET | Freq: Every day | ORAL | 4 refills | Status: DC

## 2016-10-07 ENCOUNTER — Ambulatory Visit
Admission: RE | Admit: 2016-10-07 | Discharge: 2016-10-07 | Disposition: A | Discharge status: Home / Self Care | Payer: Medicare Other | Source: Ambulatory Visit | Attending: Family Medicine | Admitting: Family Medicine

## 2016-10-07 DIAGNOSIS — Z1231 Encounter for screening mammogram for malignant neoplasm of breast: Secondary | ICD-10-CM

## 2016-12-30 ENCOUNTER — Telehealth: Payer: Self-pay | Admitting: Family Medicine

## 2016-12-30 ENCOUNTER — Telehealth: Payer: Self-pay | Admitting: Rheumatology

## 2016-12-30 DIAGNOSIS — Z79899 Other long term (current) drug therapy: Secondary | ICD-10-CM

## 2016-12-30 DIAGNOSIS — L408 Other psoriasis: Secondary | ICD-10-CM | POA: Insufficient documentation

## 2016-12-30 DIAGNOSIS — L4050 Arthropathic psoriasis, unspecified: Secondary | ICD-10-CM

## 2016-12-30 NOTE — Telephone Encounter (Signed)
Patient said Dr. Lorin PicketScott wanted her to check her Cholesterol every few months but needs a lab order. Said she wasn't due for another appt until June.  Last seen June of 2017.  Please call when order is ready.

## 2016-12-30 NOTE — Telephone Encounter (Signed)
Patient returning [name]'s call. °

## 2016-12-30 NOTE — Progress Notes (Addendum)
Office Visit Note  Patient: Emily SicksCatherine G Games             Date of Birth: 04-Jan-1946           MRN: [id number]             PCP: [name], MD Referring: Babs SciaraLuking, Scott A, MD Visit Date: 01/05/2017 Occupation: @GUAROCC @    Subjective:  Follow-up   History of Present Illness: [name] is a 71 y.o. female   Last seen 09/01/2016.  Patient is doing really well with her psoriatic arthritis. Methotrexate 7 pills per week is adequately addressing her joints. No joint pain swelling and stiffness.  Unfortunately, psoriasis is extensive and is not well-controlled. Patient states "it does not bother me to much".  Activities of Daily Living:  Patient reports morning stiffness for 5 minutes.   Patient Denies nocturnal pain.  Difficulty dressing/grooming: Denies Difficulty climbing stairs: Denies Difficulty getting out of chair: Denies Difficulty using hands for taps, buttons, cutlery, and/or writing: Denies   Review of Systems  Constitutional: Negative for fatigue.  HENT: Negative for mouth sores and mouth dryness.   Eyes: Negative for dryness.  Respiratory: Negative for shortness of breath.   Gastrointestinal: Negative for constipation and diarrhea.  Musculoskeletal: Negative for myalgias and myalgias.  Skin: Positive for rash (extensive Psoriasis to body: scalp,elbows,arms,knees,back, buttocks). Negative for sensitivity to sunlight.  Psychiatric/Behavioral: Negative for decreased concentration and sleep disturbance.    PMFS History:  Patient Active Problem List   Diagnosis Date Noted  . Psoriatic arthropathy (HCC) 12/30/2016  . Other psoriasis 12/30/2016  . High risk medications (not anticoagulants) long-term use 12/30/2016  . Psoriatic arthritis (HCC) 12/27/2014  . Unspecified vitamin D deficiency 08/06/2013  . Osteopenia 08/06/2013  . Insomnia 02/22/2013  . Hyperlipidemia 01/09/2013  . Anemia 09/12/2012  . Toe cyanosis 08/08/2012  . OSTEOARTHRITIS,  SHOULDER, RIGHT 09/15/2010  . ADHESIVE CAPSULITIS, LEFT 07/07/2009  . SHOULDER PAIN 12/18/2008  . IMPINGEMENT SYNDROME 12/18/2008    Past Medical History:  Diagnosis Date  . Hx of colonic polyps   . Hypercholesterolemia   . Psoriatic arthritis (HCC)     Family History  Problem Relation Age of Onset  . Anesthesia problems Mother   . Cancer Mother   . Hypertension Mother   . Cancer Father   . Diabetes Father   . Deep vein thrombosis Father   . Hypertension Father   . Arthritis      FH  . Diabetes      FH  . Cancer      FH  . Hypertension Sister   . Hypertension Brother   . Heart disease Brother   . Migraines Sister   . Migraines Sister   . Hypotension Neg Hx   . Malignant hyperthermia Neg Hx   . Pseudochol deficiency Neg Hx    Past Surgical History:  Procedure Laterality Date  . ABDOMINAL HYSTERECTOMY    . COLONOSCOPY  08/25/2011   Procedure: COLONOSCOPY;  Surgeon: Malissa HippoNajeeb U Rehman, MD;  Location: [facility];  Service: Endoscopy;  Laterality: N/A;  12:00  . left knee stable out  1966  . left knee staple in  1962  . VESICOVAGINAL FISTULA CLOSURE W/ TAH  1986   Social History   Social History Narrative  . No narrative on file     Objective: Vital Signs: BP 130/80   Pulse 60   Resp 14   Ht 5\' 2"  (1.575 m)   Wt 158 lb (  71.7 kg)   BMI 28.90 kg/m    Physical Exam  Constitutional: She is oriented to person, place, and time. She appears well-developed and well-nourished.  HENT:  Head: Normocephalic and atraumatic.  Eyes: EOM are normal. Pupils are equal, round, and reactive to light.  Cardiovascular: Normal rate, regular rhythm and normal heart sounds.  Exam reveals no gallop and no friction rub.   No murmur heard. Pulmonary/Chest: Effort normal and breath sounds normal. She has no wheezes. She has no rales.  Abdominal: Soft. Bowel sounds are normal. She exhibits no distension. There is no tenderness. There is no guarding. No hernia.  Musculoskeletal:  Normal range of motion. She exhibits no edema, tenderness or deformity.  Lymphadenopathy:    She has no cervical adenopathy.  Neurological: She is alert and oriented to person, place, and time. Coordination normal.  Skin: Skin is warm and dry. Capillary refill takes less than 2 seconds. No rash noted.  Psychiatric: She has a normal mood and affect. Her behavior is normal.  Nursing note and vitals reviewed.    Musculoskeletal Exam:  Full range of motion of all joints Grip strength is equal and strong bilaterally Fiber myalgia tender points are all absent  CDAI Exam: CDAI Homunculus Exam:   Joint Counts:  CDAI Tender Joint count: 0 CDAI Swollen Joint count: 0  Global Assessments:  Patient Global Assessment: 1 Provider Global Assessment: 1  CDAI Calculated Score: 2    Investigation: Findings:  October 2017 labs shows CBC with differential within normal limits CMP with GFR normal  CBC with differential is normal except for slightly low hemoglobin MCV, MCH. These are all close to normal limits and nothing needs to be done about it at this time. We will monitor. RDW was elevated to 16.1 which we will monitor.  She will be due for repeat bone density March 2019. Her last bone density was fibroid 2017 which showed a T score of -1.6 to the left forearm radius  No visits with results within 6 Month(s) from this visit.  Latest known visit with results is:  Orders Only on 04/08/2016  Component Date Value Ref Range Status  . Cholesterol 04/08/2016 224* 125 - 200 mg/dL Final  . Triglycerides 04/08/2016 71  <150 mg/dL Final  . HDL 78/29/5621 74  >=46 mg/dL Final  . Total CHOL/HDL Ratio 04/08/2016 3.0  <=3.0 Ratio Final  . VLDL 04/08/2016 14  <30 mg/dL Final  . LDL Cholesterol 04/08/2016 136* <130 mg/dL Final   Comment:   Total Cholesterol/HDL Ratio:CHD Risk                        Coronary Heart Disease Risk Table                                        Men       Women           1/2 Average Risk              3.4        3.3              Average Risk              5.0        4.4           2X Average Risk  9.6        7.1           3X Average Risk             23.4       11.0 Use the calculated Patient Ratio above and the CHD Risk table  to determine the patient's CHD Risk.       Imaging: No results found.  Patient is doing really well with her psoriatic arthritis with methotrexate 7 pills per week. However on examination, psoriasis is quite prominent but not as well-controlled as her joints are.            Speciality Comments: No specialty comments available.    Procedures:  No procedures performed Allergies: Etodolac   Assessment / Plan:     Visit Diagnoses: Psoriatic arthropathy (HCC) - 01/05/2017: No joint pain, swelling, stiffness. Excellent response with MTX 7 pills /week - Plan: CBC with Differential/Platelet, Comprehensive metabolic panel, HIV antibody, Quantiferon tb gold assay (blood), Protein electrophoresis, serum  Other psoriasis - 01/05/2017: Psoriasis poorly controlled with methotrexate. Pt offered stelara today - Plan: HIV antibody, Quantiferon tb gold assay (blood), Protein electrophoresis, serum  High risk medications (not anticoagulants) long-term use - MTX-2.5mg  Take 7 tablets (17.5 mg total) by mouth once a week - Plan: CBC with Differential/Platelet, Comprehensive metabolic panel, HIV antibody, Quantiferon tb gold assay (blood), Protein electrophoresis, serum  Osteopenia of multiple sites  High risk medications (not anticoagulants) long-term use - Plan: CBC with Differential/Platelet, Comprehensive metabolic panel, HIV antibody, Quantiferon tb gold assay (blood), Protein electrophoresis, serum  Need for hepatitis B screening test - Plan: Hepatitis A antibody, IgM, Hepatitis B Core Antibody, IgM, Hepatitis B Surface AntiGEN  Need for hepatitis C screening test - Plan: Hepatitis A antibody, IgM, Hepatitis C Antibody     Plan: #1: Psoriatic arthritis. Doing well with methotrexate 7 pills per week no joint pain stiffness and swelling  #2: Psoriasis. Extensive psoriasis to body including scalp, elbows, arms, knees, back, buttocks. I discussed the opportunity to do biologic treatment for the patient and she would like to hear her options  #3: High risk prescription: Methotrexate 2.5 mg, 7 pills per week Folic acid 2 mg daily Patient is a need for repeat labs  #4:Hx Osteopenia. T score is -1.6 on DEXA done 12/01/2015 at [facility] [place]  #5: Discuss other treatment options for better control of the psoriasis. Stelara might be an option for the patient Handout and consent on Stelara Her last hepatitis panel was 09/25/2009 so we will need updated since it's been 7 years. We will order hepatitis panel, TB gold, SPEP, HIV  #6: We may want to do patient assistance if the patient qualifies for obtaining Stelara.  Orders: Orders Placed This Encounter  Procedures  . HIV antibody  . Quantiferon tb gold assay (blood)  . Protein electrophoresis, serum  . Hepatitis A antibody, IgM  . Hepatitis C Antibody  . Hepatitis B Core Antibody, IgM  . Hepatitis B Surface AntiGEN   No orders of the defined types were placed in this encounter.   Face-to-face time spent with patient was 30 minutes. 50% of time was spent in counseling and coordination of care.  Follow-Up Instructions: No Follow-up on file.   Tawni Pummel, PA-C  On exam today her psoriatic arthritis is fairly well controlled. She continues to have extensive psoriasis. I had detailed discussion with the patient regarding different treatment options and their side effects. Patient was in agreement to proceed  with Stelara. We will proceed with application for Stelara. I examined and evaluated the patient with Tawni Pummel PA. The plan of care was discussed as noted above.  Pollyann Savoy, MD Note - This record has been created using  Animal nutritionist.  Chart creation errors have been sought, but may not always  have been located. Such creation errors do not reflect on  the standard of medical care.

## 2016-12-30 NOTE — Telephone Encounter (Signed)
Patient called stating that she went to get her labs done but her labs were not released in the system.  CB#[phone].  Thank you.

## 2016-12-30 NOTE — Telephone Encounter (Signed)
CBC and CMP orders released.

## 2016-12-30 NOTE — Telephone Encounter (Signed)
Patient states she has had her labs drawn. Lab was able to see an order.

## 2017-01-03 ENCOUNTER — Ambulatory Visit: Payer: Medicare Other | Admitting: Rheumatology

## 2017-01-05 ENCOUNTER — Ambulatory Visit (INDEPENDENT_AMBULATORY_CARE_PROVIDER_SITE_OTHER): Payer: Medicare Other | Admitting: Rheumatology

## 2017-01-05 ENCOUNTER — Encounter: Payer: Self-pay | Admitting: Rheumatology

## 2017-01-05 VITALS — BP 130/80 | HR 60 | Resp 14 | Ht 62.0 in | Wt 158.0 lb

## 2017-01-05 DIAGNOSIS — M8589 Other specified disorders of bone density and structure, multiple sites: Secondary | ICD-10-CM

## 2017-01-05 DIAGNOSIS — L408 Other psoriasis: Secondary | ICD-10-CM

## 2017-01-05 DIAGNOSIS — L4050 Arthropathic psoriasis, unspecified: Secondary | ICD-10-CM

## 2017-01-05 DIAGNOSIS — Z1159 Encounter for screening for other viral diseases: Secondary | ICD-10-CM

## 2017-01-05 DIAGNOSIS — Z79899 Other long term (current) drug therapy: Secondary | ICD-10-CM

## 2017-01-05 LAB — COMPREHENSIVE METABOLIC PANEL
ALT: 16 U/L (ref 6–29)
AST: 26 U/L (ref 10–35)
Albumin: 4.2 g/dL (ref 3.6–5.1)
Alkaline Phosphatase: 98 U/L (ref 33–130)
BILIRUBIN TOTAL: 0.6 mg/dL (ref 0.2–1.2)
BUN: 10 mg/dL (ref 7–25)
CO2: 24 mmol/L (ref 20–31)
Calcium: 9.4 mg/dL (ref 8.6–10.4)
Chloride: 106 mmol/L (ref 98–110)
Creat: 0.85 mg/dL (ref 0.60–0.93)
Glucose, Bld: 75 mg/dL (ref 65–99)
Potassium: 4.4 mmol/L (ref 3.5–5.3)
Sodium: 140 mmol/L (ref 135–146)
Total Protein: 6.8 g/dL (ref 6.1–8.1)

## 2017-01-05 LAB — CBC WITH DIFFERENTIAL/PLATELET
BASOS PCT: 0 %
Basophils Absolute: 0 cells/uL (ref 0–200)
Eosinophils Absolute: 104 cells/uL (ref 15–500)
Eosinophils Relative: 2 %
HCT: 36.5 % (ref 35.0–45.0)
HEMOGLOBIN: 12.2 g/dL (ref 11.7–15.5)
LYMPHS ABS: 2080 {cells}/uL (ref 850–3900)
Lymphocytes Relative: 40 %
MCH: 25.1 pg — ABNORMAL LOW (ref 27.0–33.0)
MCHC: 33.4 g/dL (ref 32.0–36.0)
MCV: 75.1 fL — ABNORMAL LOW (ref 80.0–100.0)
MONOS PCT: 8 %
MPV: 10.8 fL (ref 7.5–12.5)
Monocytes Absolute: 416 cells/uL (ref 200–950)
Neutro Abs: 2600 cells/uL (ref 1500–7800)
Neutrophils Relative %: 50 %
PLATELETS: 228 10*3/uL (ref 140–400)
RBC: 4.86 MIL/uL (ref 3.80–5.10)
RDW: 16.9 % — ABNORMAL HIGH (ref 11.0–15.0)
WBC: 5.2 10*3/uL (ref 3.8–10.8)

## 2017-01-05 NOTE — Patient Instructions (Signed)
Ustekinumab injection  What is this medicine?  USTEKINUMAB (US te KIN ue mab) is used to treat plaque psoriasis and psoriatic arthritis. This medicine is also used to treat Crohn's disease. It is not a cure.  This medicine may be used for other purposes; ask your health care provider or pharmacist if you have questions.  COMMON BRAND NAME(S): Stelara  What should I tell my health care provider before I take this medicine?  They need to know if you have any of these conditions:  -cancer  -diabetes  -immune system problems  -infection (especially a virus infection such as chickenpox, cold sores, or herpes) or history of infections  -receiving or have received allergy shots  -recently received or scheduled to receive a vaccine  -tuberculosis, a positive skin test for tuberculosis, or have recently been in close contact with someone who has tuberculosis  -an unusual reaction to ustekinumab, latex, other medicines, foods, dyes, or preservatives  -pregnant or trying to get pregnant  -breast-feeding  How should I use this medicine?  This medicine is for injection under the skin or infusion into a vein. It is usually given by a health care professional in a hospital or clinic setting.  If you get this medicine at home, you will be taught how to prepare and give this medicine. Use exactly as directed. Take your medicine at regular intervals. Do not take your medicine more often than directed.  It is important that you put your used needles and syringes in a special sharps container. Do not put them in a trash can. If you do not have a sharps container, call your pharmacist or healthcare provider to get one.  A special MedGuide will be given to you by the pharmacist with each prescription and refill. Be sure to read this information carefully each time.  Talk to your pediatrician regarding the use of this medicine in children. While this drug may be prescribed for children as young as 12 years for selected conditions,  precautions do apply.  Overdosage: If you think you have taken too much of this medicine contact a poison control center or emergency room at once.  NOTE: This medicine is only for you. Do not share this medicine with others.  What if I miss a dose?  If you miss a dose, take it as soon as you can. If it is almost time for your next dose, take only that dose. Do not take double or extra doses.  What may interact with this medicine?  Do not take this medicine with any of the following medications:  -live virus vaccines  This medicine may also interact with the following medications:  -cyclosporine  -immunosuppressives  -vaccines  -warfarin  This list may not describe all possible interactions. Give your health care provider a list of all the medicines, herbs, non-prescription drugs, or dietary supplements you use. Also tell them if you smoke, drink alcohol, or use illegal drugs. Some items may interact with your medicine.  What should I watch for while using this medicine?  Your condition will be monitored carefully while you are receiving this medicine. Tell your doctor or healthcare professional if your symptoms do not start to get better or if they get worse.  You will be tested for tuberculosis (TB) before you start this medicine. If your doctor prescribes any medicine for TB, you should start taking the TB medicine before starting this medicine. Make sure to finish the full course of TB medicine.  Call your   doctor or health care professional if you get a cold or other infection while receiving this medicine. Do not treat yourself. This medicine may decrease your body's ability to fight infection.  Talk to your doctor about your risk of cancer. You may be more at risk for certain types of cancers if you take this medicine.  What side effects may I notice from receiving this medicine?  Side effects that you should report to your doctor or health care professional as soon as possible:  -allergic reactions like skin  rash, itching or hives, swelling of the face, lips, or tongue  -breathing problems  -changes in vision  -confusion  -seizures  -signs and symptoms of infection like fever or chills; cough; sore throat; pain or trouble passing urine  -swollen lymph nodes in the neck, underarm, or groin areas  -unexplained weight loss  -unusually weak or tired  -vomiting  Side effects that usually do not require medical attention (report to your doctor or health care professional if they continue or are bothersome):  -headache  -nausea  -redness, itching, swelling, or bruising at site where injected  This list may not describe all possible side effects. Call your doctor for medical advice about side effects. You may report side effects to FDA at 1-800-FDA-1088.  Where should I keep my medicine?  Keep out of the reach of children.  If you are using this medicine at home, you will be instructed on how to store this medicine. Store the prefilled syringes in a refrigerator between 2 to 8 degrees C (36 to 46 degrees F). Keep in the original carton. Protect from light. Do not freeze. Do not shake. Throw away any unused medicine after the expiration date on the label.  NOTE: This sheet is a summary. It may not cover all possible information. If you have questions about this medicine, talk to your doctor, pharmacist, or health care provider.   2018 Elsevier/Gold Standard (2016-07-20 09:12:47)

## 2017-01-05 NOTE — Progress Notes (Signed)
Pharmacy Note Subjective: Patient presents today to the [facility] to see Dr. Gabrielle Dare. [place].   Patient seen by the pharmacist for counseling on Stelara.    Objective: TB Test: ordered today Hepatitis panel: ordered today HIV: ordered today CBC ordered today  Assessment/Plan:  Counseled patient that Stelara is a IL-12 & 23 inhibitor.  Reviewed Stelara dose of 45 mg at week 0, 4, then every 12 weeks.  Counseled patient on purpose, proper use, and adverse effects of Stelara.  Reviewed the most common adverse effects including infection and injection site reactions. Discussed that there is the possibility of an increased risk of malignancy and rare cases of RPLS.  Reviewed the signs and symptoms of RPLS with patient.  Reviewed the importance of regular labs while on Stelara therapy.  Counseled patient that Stelara should be held prior to scheduled surgery.  Counseled patient to avoid live vaccines while on Stelara.  Provided patient with medication education material and answered all questions.  Patient voiced understanding.  Patient consented to Stelara.  Will upload consent into the media tab.  Reviewed storage instructions of Stelara.  Assisted patient in completing paperwork for the Stelara patient assistance program as Stelara copay is very expensive with her insurance.  Will need patient's income information before application can be submitted.  Patient will bring that information to the office next week.  Will submit the application once I receive that information.  Advised patient to contact the office to schedule the first Stelara injection if Stelara is approved by insurance/patient assistance program.  Patient voiced understanding.    Lilla Shook, Pharm.D., BCPS Clinical Pharmacist Pager: [phone] Phone: [phone] 01/05/2017 11:57 AM

## 2017-01-06 ENCOUNTER — Telehealth: Payer: Self-pay | Admitting: Rheumatology

## 2017-01-06 LAB — HIV ANTIBODY (ROUTINE TESTING W REFLEX): HIV 1&2 Ab, 4th Generation: NONREACTIVE

## 2017-01-06 LAB — HEPATITIS A ANTIBODY, IGM: HEP A IGM: NONREACTIVE

## 2017-01-06 LAB — HEPATITIS B CORE ANTIBODY, IGM: HEP B C IGM: NONREACTIVE

## 2017-01-06 LAB — HEPATITIS B SURFACE ANTIGEN: Hepatitis B Surface Ag: NEGATIVE

## 2017-01-06 LAB — HEPATITIS C ANTIBODY: HCV AB: NEGATIVE

## 2017-01-06 NOTE — Telephone Encounter (Signed)
Raynelle Fanning from [facility] called wanting to know if there are any contra indications to Stelara.  CB#[phone].  Thank you.

## 2017-01-07 ENCOUNTER — Telehealth: Payer: Self-pay | Admitting: Rheumatology

## 2017-01-07 ENCOUNTER — Telehealth: Payer: Self-pay

## 2017-01-07 LAB — QUANTIFERON TB GOLD ASSAY (BLOOD)
Interferon Gamma Release Assay: NEGATIVE
MITOGEN-NIL SO: 5.87 [IU]/mL
Quantiferon Nil Value: 0.03 IU/mL
Quantiferon Tb Ag Minus Nil Value: 0 IU/mL

## 2017-01-07 LAB — PROTEIN ELECTROPHORESIS, SERUM
ALPHA-2-GLOBULIN: 0.6 g/dL (ref 0.5–0.9)
Albumin ELP: 4.2 g/dL (ref 3.8–4.8)
Alpha-1-Globulin: 0.3 g/dL (ref 0.2–0.3)
Beta 2: 0.4 g/dL (ref 0.2–0.5)
Beta Globulin: 0.4 g/dL (ref 0.4–0.6)
Gamma Globulin: 1.1 g/dL (ref 0.8–1.7)
Total Protein, Serum Electrophoresis: 7 g/dL (ref 6.1–8.1)

## 2017-01-07 NOTE — Telephone Encounter (Signed)
Received a conformation from cover my meds regarding a prior authorization approval for Setlara from 01/06/17 to 01/06/18.   Still waiting for patient to bring financial documents to submit application for patient assistance.   Gurfateh Mcclain, [place], CPhT  9:19 AM

## 2017-01-07 NOTE — Telephone Encounter (Signed)
Spoke to [place] and advised there are no contraindications to Stelara.  They will continue to process the prior authorization request and will fax [place] once PA has been processed.     Lilla Shook, Pharm.D., BCPS, CPP Clinical Pharmacist Pager: [phone] Phone: [phone] 01/07/2017 8:53 AM

## 2017-01-07 NOTE — Telephone Encounter (Signed)
[name] from [facility] left a message to let us know that the prior authorization for [name] has been approved and is good for one year starting April 5.  If you have any questions, their number is [phone].  Thank you.

## 2017-01-10 ENCOUNTER — Telehealth: Payer: Self-pay | Admitting: Pharmacist

## 2017-01-10 NOTE — Telephone Encounter (Signed)
Patient brought income information by the office today.  Stelara patient assistance application was submitted.  I called patient to let her know the application was submited.  I left a message on her answering machine.  Will update patient once I know status of application.    Lilla Shook, Pharm.D., BCPS, CPP Clinical Pharmacist Pager: [phone] Phone: [phone] 01/10/2017 11:33 AM

## 2017-01-25 NOTE — Telephone Encounter (Signed)
Called [employer] to check the status of patient's application. Mikel, a representative, states that the patient has been denied because she has Medicare Part D Rx coverage. In order to be considered, she must spend a total of 4% of her income on medication from 2018.   Reference number: [id number] Phone number: [phone]  Spoke with patient to update her. She is going to think about her options. She will call us back when she has made a decision.   Krithi Bray, [place], CPhT 1:31 PM

## 2017-01-25 NOTE — Telephone Encounter (Signed)
Ok to apply for [employer]

## 2017-01-25 NOTE — Telephone Encounter (Signed)
Patient was seen on 01/05/17 and plan was to initiate Stelara for her psoriasis (psoriatic arthritis controlled with methotrexate).  We applied for Stelara patient assistance program; however, patient did not qualify and is unable to afford medication cost.  Patient is going to schedule a visit to discuss therapy options.

## 2017-01-27 NOTE — Telephone Encounter (Signed)
I called Mrs. Potvin and advised that we will consider Otezla.  I briefly counseled patient on purpose, proper use, and adverse effects of Otezla.  Will plan to discuss further at patient's follow up on 02/14/17.  I mailed patient information about the medication along with the patient assistance application to read prior to her visit.  Patient denies any further questions at this time.    Lilla Shook, Pharm.D., BCPS, CPP Clinical Pharmacist Pager: [phone] Phone: [phone] 01/27/2017 9:01 AM

## 2017-02-09 DIAGNOSIS — Z8639 Personal history of other endocrine, nutritional and metabolic disease: Secondary | ICD-10-CM | POA: Insufficient documentation

## 2017-02-09 NOTE — Progress Notes (Signed)
Office Visit Note  Patient: [name]             Date of Birth: [birth year]           MRN: [id number]             PCP: Kathyrn Drown, MD Referring: Kathyrn Drown, MD Visit Date: 02/14/2017 Occupation: '@GUAROCC' @    Subjective: Psoriasis   History of Present Illness: [name] is a 71 y.o. female  with history of psoriasis and psoriatic arthritis. She states she's not had much discomfort in her joints. She states she only has intermittent pain in her hands. Recently she's not having any joint discomfort. She had quite extensive psoriasis for which we were considering  Stelara. It was declined by the insurance. She states she's been using aloe vera with Vaseline on her skin which is helping her a lot.  Activities of Daily Living:  Patient reports morning stiffness for 0 minutes.   Patient Denies nocturnal pain.  Difficulty dressing/grooming: Denies Difficulty climbing stairs: Denies Difficulty getting out of chair: Denies Difficulty using hands for taps, buttons, cutlery, and/or writing: Denies   Review of Systems  Constitutional: Negative for fatigue, night sweats, weight gain, weight loss and weakness.  HENT: Negative for mouth sores, mouth dryness and nose dryness.   Eyes: Negative for pain, redness, visual disturbance and dryness.  Respiratory: Negative for cough, shortness of breath and difficulty breathing.   Cardiovascular: Negative for chest pain, palpitations, hypertension, irregular heartbeat and swelling in legs/feet.  Gastrointestinal: Negative for blood in stool, constipation and diarrhea.  Endocrine: Negative for increased urination.  Genitourinary: Negative for painful urination.  Musculoskeletal: Positive for joint swelling and morning stiffness. Negative for arthralgias, joint pain, myalgias, muscle weakness, muscle tenderness and myalgias.  Skin: Positive for rash (Psoriasis ). Negative for color change, nodules/bumps, skin tightness and  ulcers.  Allergic/Immunologic: Negative for susceptible to infections.  Neurological: Negative for dizziness, headaches, memory loss and night sweats.  Hematological: Negative for swollen glands.  Psychiatric/Behavioral: Negative for depressed mood and sleep disturbance. The patient is not nervous/anxious.     PMFS History:  Patient Active Problem List   Diagnosis Date Noted  . History of hyperlipidemia 02/09/2017  . Other psoriasis 12/30/2016  . High risk medications (not anticoagulants) long-term use 12/30/2016  . Psoriatic arthritis ([place]) 12/27/2014  . Vitamin D deficiency 08/06/2013  . Osteopenia 08/06/2013  . Insomnia 02/22/2013  . Hyperlipidemia 01/09/2013  . Anemia 09/12/2012  . Toe cyanosis 08/08/2012  . OSTEOARTHRITIS, SHOULDER, RIGHT 09/15/2010    Past Medical History:  Diagnosis Date  . Hx of colonic polyps   . Hypercholesterolemia   . Psoriatic arthritis ([place])     Family History  Problem Relation Age of Onset  . Anesthesia problems Mother   . Cancer Mother   . Hypertension Mother   . Cancer Father   . Diabetes Father   . Deep vein thrombosis Father   . Hypertension Father   . Arthritis Unknown        FH  . Diabetes Unknown        FH  . Cancer Unknown        FH  . Hypertension Sister   . Hypertension Brother   . Heart disease Brother   . Migraines Sister   . Migraines Sister   . Hypotension Neg Hx   . Malignant hyperthermia Neg Hx   . Pseudochol deficiency Neg Hx    Past Surgical History:  Procedure Laterality Date  . ABDOMINAL HYSTERECTOMY    . COLONOSCOPY  08/25/2011   Procedure: COLONOSCOPY;  Surgeon: Rogene Houston, MD;  Location: [facility];  Service: Endoscopy;  Laterality: N/A;  12:00  . left knee stable out  1966  . left knee staple in  1962  . VESICOVAGINAL FISTULA CLOSURE W/ TAH  1986   Social History   Social History Narrative  . No narrative on file     Objective: Vital Signs: BP (!) 123/56 (BP Location: Left Arm, Patient  Position: Sitting, Cuff Size: Normal)   Pulse (!) 59   Resp 20   Ht '5\' 2"'  (1.575 m)   Wt 156 lb (70.8 kg)   BMI 28.53 kg/m    Physical Exam  Constitutional: She is oriented to person, place, and time. She appears well-developed and well-nourished.  HENT:  Head: Normocephalic and atraumatic.  Eyes: Conjunctivae and EOM are normal.  Neck: Normal range of motion.  Cardiovascular: Normal rate, regular rhythm, normal heart sounds and intact distal pulses.   Pulmonary/Chest: Effort normal and breath sounds normal.  Abdominal: Soft. Bowel sounds are normal.  Lymphadenopathy:    She has no cervical adenopathy.  Neurological: She is alert and oriented to person, place, and time.  Skin: Skin is warm and dry. Capillary refill takes less than 2 seconds. Rash noted.  She has extensive psoriasis involving both upper and lower extremities although they are not as strong a as they were last visit.  Psychiatric: She has a normal mood and affect. Her behavior is normal.  Nursing note and vitals reviewed.    Musculoskeletal Exam: C-spine and thoracic lumbar spine good range of motion. Shoulder joints although joints wrist joint MCPs PIPs DIPs with good range of motion with no synovitis. Hip joints knee joints ankles MTPs PIPs with good range of motion with no synovitis.  CDAI Exam: CDAI Homunculus Exam:   Joint Counts:  CDAI Tender Joint count: 0 CDAI Swollen Joint count: 0  Global Assessments:  Patient Global Assessment: 1 Provider Global Assessment: 1  CDAI Calculated Score: 2    Investigation: Findings:  Negative hepatitis panel, TB gold, SPEP, and HIV 01/05/17   Office Visit on 01/05/2017  Component Date Value Ref Range Status  . WBC 01/05/2017 5.2  3.8 - 10.8 K/uL Final  . RBC 01/05/2017 4.86  3.80 - 5.10 MIL/uL Final  . Hemoglobin 01/05/2017 12.2  11.7 - 15.5 g/dL Final  . HCT 01/05/2017 36.5  35.0 - 45.0 % Final  . MCV 01/05/2017 75.1* 80.0 - 100.0 fL Final  . MCH 01/05/2017  25.1* 27.0 - 33.0 pg Final  . MCHC 01/05/2017 33.4  32.0 - 36.0 g/dL Final  . RDW 01/05/2017 16.9* 11.0 - 15.0 % Final  . Platelets 01/05/2017 228  140 - 400 K/uL Final  . MPV 01/05/2017 10.8  7.5 - 12.5 fL Final  . Neutro Abs 01/05/2017 2600  1,500 - 7,800 cells/uL Final  . Lymphs Abs 01/05/2017 2080  850 - 3,900 cells/uL Final  . Monocytes Absolute 01/05/2017 416  200 - 950 cells/uL Final  . Eosinophils Absolute 01/05/2017 104  15 - 500 cells/uL Final  . Basophils Absolute 01/05/2017 0  0 - 200 cells/uL Final  . Neutrophils Relative % 01/05/2017 50  % Final  . Lymphocytes Relative 01/05/2017 40  % Final  . Monocytes Relative 01/05/2017 8  % Final  . Eosinophils Relative 01/05/2017 2  % Final  . Basophils Relative 01/05/2017 0  %  Final  . Smear Review 01/05/2017 Criteria for review not met   Final  . Sodium 01/05/2017 140  135 - 146 mmol/L Final  . Potassium 01/05/2017 4.4  3.5 - 5.3 mmol/L Final  . Chloride 01/05/2017 106  98 - 110 mmol/L Final  . CO2 01/05/2017 24  20 - 31 mmol/L Final  . Glucose, Bld 01/05/2017 75  65 - 99 mg/dL Final  . BUN 01/05/2017 10  7 - 25 mg/dL Final  . Creat 01/05/2017 0.85  0.60 - 0.93 mg/dL Final   Comment:   For patients > or = 71 years of age: The upper reference limit for Creatinine is approximately 13% higher for people identified as African-American.     . Total Bilirubin 01/05/2017 0.6  0.2 - 1.2 mg/dL Final  . Alkaline Phosphatase 01/05/2017 98  33 - 130 U/L Final  . AST 01/05/2017 26  10 - 35 U/L Final  . ALT 01/05/2017 16  6 - 29 U/L Final  . Total Protein 01/05/2017 6.8  6.1 - 8.1 g/dL Final  . Albumin 01/05/2017 4.2  3.6 - 5.1 g/dL Final  . Calcium 01/05/2017 9.4  8.6 - 10.4 mg/dL Final  . HIV 1&2 Ab, 4th Generation 01/05/2017 NONREACTIVE  NONREACTIVE Final   Comment:   HIV-1 antigen and HIV-1/HIV-2 antibodies were not detected.  There is no laboratory evidence of HIV infection.   HIV-1/2 Antibody Diff        Not indicated. HIV-1  RNA, Qual TMA          Not indicated.     PLEASE NOTE: This information has been disclosed to you from records whose confidentiality may be protected by state law. If your state requires such protection, then the state law prohibits you from making any further disclosure of the information without the specific written consent of the person to whom it pertains, or as otherwise permitted by law. A general authorization for the release of medical or other information is NOT sufficient for this purpose.   The performance of this assay has not been clinically validated in patients less than 53 years old.   For additional information please refer to http://education.questdiagnostics.com/faq/FAQ106.  (This link is being provided for informational/educational purposes only.)     . Interferon Gamma Release Assay 01/05/2017 NEGATIVE  NEGATIVE Final   Negative test result. M. tuberculosis complex infection unlikely.  . Quantiferon Nil Value 01/05/2017 0.03  IU/mL Final  . Mitogen-Nil 01/05/2017 5.87  IU/mL Final  . Quantiferon Tb Ag Minus Nil Value 01/05/2017 0.00  IU/mL Final   Comment:   The Nil tube value is used to determine if the patient has a preexisting immune response which could cause a false-positive reading on the test. In order for a test to be valid, the Nil tube must have a value of less than or equal to 8.0 IU/mL.   The mitogen control tube is used to assure the patient has a healthy immune status and also serves as a control for correct blood handling and incubation. It is used to detect false-negative readings. The mitogen tube must have a gamma interferon value of greater than or equal to 0.5 IU/mL higher than the value of the Nil tube.   The TB antigen tube is coated with the M. tuberculosis specific antigens. For a test to be considered positive, the TB antigen tube value minus the Nil tube value must be greater than or equal to 0.35 IU/mL.   For additional  information, please  refer to http://education.questdiagnostics.com/faq/QFT (This link is being provided for informational/educational purposes only.)   . Total Protein, Serum Electrophores* 01/05/2017 7.0  6.1 - 8.1 g/dL Final  . Albumin ELP 01/05/2017 4.2  3.8 - 4.8 g/dL Final  . Alpha-1-Globulin 01/05/2017 0.3  0.2 - 0.3 g/dL Final  . Alpha-2-Globulin 01/05/2017 0.6  0.5 - 0.9 g/dL Final  . Beta Globulin 01/05/2017 0.4  0.4 - 0.6 g/dL Final  . Beta 2 01/05/2017 0.4  0.2 - 0.5 g/dL Final  . Gamma Globulin 01/05/2017 1.1  0.8 - 1.7 g/dL Final  . Abnormal Protein Band1 01/05/2017 NOT DET  g/dL Final  . SPE Interp. 01/05/2017 SEE NOTE   Final   Comment: Normal Electrophoretic Pattern Reviewed by Odis Hollingshead, MD, PhD, FCAP (Electronic Signature on File)   . Abnormal Protein Band2 01/05/2017 NOT DET  g/dL Final  . Abnormal Protein Band3 01/05/2017 NOT DET  g/dL Final  . Hep A IgM 01/05/2017 NON REACTIVE  NON REACTIVE Final  . HCV Ab 01/05/2017 NEGATIVE  NEGATIVE Final  . Hep B C IgM 01/05/2017 NON REACTIVE  NON REACTIVE Final   Comment: High levels of Hepatitis B Core IgM antibody are detectable during the acute stage of Hepatitis B. This antibody is used to differentiate current from past HBV infection.     . Hepatitis B Surface Ag 01/05/2017 NEGATIVE  NEGATIVE Final    Imaging: No results found.  Speciality Comments: No specialty comments available.    Procedures:  No procedures performed Allergies: Etodolac   Assessment / Plan:     Visit Diagnoses: Psoriatic arthritis ([place]): She is doing quite well on methotrexate without any active synovitis. He had detailed discussion regarding different treatment options and distal nerve was declined. She is okay to proceed with [place]. Indications side effects contraindications were discussed. A handout was given consent was taken. We will apply for [facility]. I believe that would be effective in controlling her skin disease and her  joint disease.  Other psoriasis: Her psoriasis is still quite extensive involving her upper and lower extremities. This scales are not as dry with the Vaseline and aloe vera.  High risk medications (not anticoagulants) long-term use - Methotrexate 7 tablets by mouth every week, folic acid 2 mg by mouth daily  Osteopenia of multiple sites: She does take calcium and vitamin D and resistive exercises  Vitamin D deficiency : she is on supplement  History of anemia  History of hyperlipidemia  Primary insomnia    Orders: No orders of the defined types were placed in this encounter.  No orders of the defined types were placed in this encounter.   Face-to-face time spent with patient was 30 minutes. 50% of time was spent in counseling and coordination of care.  Follow-Up Instructions: Return in about 3 months (around 05/17/2017) for Psoriatic arthriti, Psoriasis.   Bo Merino, MD  Note - This record has been created using Editor, commissioning.  Chart creation errors have been sought, but may not always  have been located. Such creation errors do not reflect on  the standard of medical care.

## 2017-02-14 ENCOUNTER — Ambulatory Visit (INDEPENDENT_AMBULATORY_CARE_PROVIDER_SITE_OTHER): Payer: Medicare Other | Admitting: Rheumatology

## 2017-02-14 ENCOUNTER — Encounter: Payer: Self-pay | Admitting: Rheumatology

## 2017-02-14 VITALS — BP 123/56 | HR 59 | Resp 20 | Ht 62.0 in | Wt 156.0 lb

## 2017-02-14 DIAGNOSIS — Z79899 Other long term (current) drug therapy: Secondary | ICD-10-CM | POA: Diagnosis not present

## 2017-02-14 DIAGNOSIS — L4050 Arthropathic psoriasis, unspecified: Secondary | ICD-10-CM

## 2017-02-14 DIAGNOSIS — E559 Vitamin D deficiency, unspecified: Secondary | ICD-10-CM | POA: Diagnosis not present

## 2017-02-14 DIAGNOSIS — Z862 Personal history of diseases of the blood and blood-forming organs and certain disorders involving the immune mechanism: Secondary | ICD-10-CM

## 2017-02-14 DIAGNOSIS — M8589 Other specified disorders of bone density and structure, multiple sites: Secondary | ICD-10-CM

## 2017-02-14 DIAGNOSIS — L408 Other psoriasis: Secondary | ICD-10-CM | POA: Diagnosis not present

## 2017-02-14 DIAGNOSIS — F5101 Primary insomnia: Secondary | ICD-10-CM | POA: Diagnosis not present

## 2017-02-14 DIAGNOSIS — Z8639 Personal history of other endocrine, nutritional and metabolic disease: Secondary | ICD-10-CM

## 2017-02-14 NOTE — Patient Instructions (Signed)
Apremilast oral tablets What is this medicine? APREMILAST (a PRE mil ast) is used to treat plaque psoriasis and psoriatic arthritis. This medicine may be used for other purposes; ask your health care provider or pharmacist if you have questions. COMMON BRAND NAME(S): Otezla What should I tell my health care provider before I take this medicine? They need to know if you have any of these conditions: -dehydration -kidney disease -mental illness -an unusual or allergic reaction to apremilast, other medicines, foods, dyes, or preservatives -pregnant or trying to get pregnant -breast-feeding How should I use this medicine? Take this medicine by mouth with a glass of water. Follow the directions on the prescription label. Do not cut, crush or chew this medicine. You can take it with or without food. If it upsets your stomach, take it with food. Take your medicine at regular intervals. Do not take it more often than directed. Do not stop taking except on your doctor's advice. Talk to your pediatrician regarding the use of this medicine in children. Special care may be needed. Overdosage: If you think you have taken too much of this medicine contact a poison control center or emergency room at once. NOTE: This medicine is only for you. Do not share this medicine with others. What if I miss a dose? If you miss a dose, take it as soon as you can. If it is almost time for your next dose, take only that dose. Do not take double or extra doses. What may interact with this medicine? This medicine may interact with the following medications: -certain medicines for seizures like carbamazepine, phenobarbital, phenytoin -rifampin This list may not describe all possible interactions. Give your health care provider a list of all the medicines, herbs, non-prescription drugs, or dietary supplements you use. Also tell them if you smoke, drink alcohol, or use illegal drugs. Some items may interact with your  medicine. What should I watch for while using this medicine? Tell your doctor or healthcare professional if your symptoms do not start to get better or if they get worse. Patients and their families should watch out for new or worsening depression or thoughts of suicide. Also watch out for sudden changes in feelings such as feeling anxious, agitated, panicky, irritable, hostile, aggressive, impulsive, severely restless, overly excited and hyperactive, or not being able to sleep. If this happens, call your health care professional. Check with your doctor or health care professional if you get an attack of severe diarrhea, nausea and vomiting, or if you sweat a lot. The loss of too much body fluid can make it dangerous for you to take this medicine. What side effects may I notice from receiving this medicine? Side effects that you should report to your doctor or health care professional as soon as possible: -depressed mood -weight loss Side effects that usually do not require medical attention (report to your doctor or health care professional if they continue or are bothersome): -diarrhea -headache -nausea, vomiting This list may not describe all possible side effects. Call your doctor for medical advice about side effects. You may report side effects to FDA at 1-800-FDA-1088. Where should I keep my medicine? Keep out of the reach of children. Store below 30 degrees C (86 degrees F). Throw away any unused medicine after the expiration date. NOTE: This sheet is a summary. It may not cover all possible information. If you have questions about this medicine, talk to your doctor, pharmacist, or health care provider.  2018 Elsevier/Gold Standard (2016-04-07 10:55:44)  

## 2017-02-14 NOTE — Progress Notes (Signed)
Pharmacy Note  Subjective: Patient presents today to the [facility] to see Dr. Corliss Skainseveshwar.  Mrs. Krach is a pleasant 71 yo F who presents for follow up of psoriatic arthritis and psoriasis.  She is currently taking methotrexate 7 tablets (17.5 mg) weekly and folic acid 2 mg daily.  Most recent standing labs were on 01/05/17 which were normal.  She is will be due for standing labs again in July 2018 and every 3 months.    We had considered starting Stelara for her psoriasis.  We applied for patient assistance but patient did not qualify.  Dacen Fray[place] was considered.  I was asked to see the patient for counseling on Otezla.    Objective: CMP     Component Value Date/Time   NA 140 01/05/2017 1053   NA 142 12/18/2014 0935   K 4.4 01/05/2017 1053   CL 106 01/05/2017 1053   CO2 24 01/05/2017 1053   GLUCOSE 75 01/05/2017 1053   BUN 10 01/05/2017 1053   BUN 9 12/18/2014 0935   CREATININE 0.85 01/05/2017 1053   CALCIUM 9.4 01/05/2017 1053   PROT 6.8 01/05/2017 1053   PROT 6.6 12/18/2014 0935   ALBUMIN 4.2 01/05/2017 1053   ALBUMIN 4.4 12/18/2014 0935   AST 26 01/05/2017 1053   ALT 16 01/05/2017 1053   ALKPHOS 98 01/05/2017 1053   BILITOT 0.6 01/05/2017 1053   BILITOT 0.2 12/18/2014 0935   GFRNONAA 77 12/18/2014 0935   GFRAA 89 12/18/2014 0935    Assessment/Plan:  Counseled patient that Teisha [place] is a PDE 4 inhibitor that works to treat psoriasis and the joint pain and tenderness of psoriatic arthritis.  Counseled patient on purpose, proper use, and adverse effects of Otezla.  Reviewed the most common adverse effects of weight loss, depression, nausea/diarrhea/vomiting, headaches, and nasal congestion.  Provided patient with medication education material and answered all questions.  Patient consented to [place].    Bobbye Petti [place] patient assistance application was completed during office visit and submitted to the program.  Will update patient once we know status of the application.    Lilla Shookachel Kateleen Encarnacion, Pharm.D., BCPS, CPP Clinical Pharmacist Pager: [phone] Phone: [phone] 02/14/2017 11:45 AM

## 2017-02-15 ENCOUNTER — Telehealth: Payer: Self-pay

## 2017-02-15 NOTE — Telephone Encounter (Signed)
Received a conformation from Cover My Med  regarding a prior authorization approval for Otezla from 02/15/17 to 02/15/18  Abran DukeHopkins, Inez Stantz, CPhT  9:01 AM

## 2017-02-15 NOTE — Telephone Encounter (Signed)
Faxed an [place] Support Plus application for patient.   Received a call from Ojo [place] from [place] who states that the application has been received. She needed to know the reason for assistance. Patient is unable to afford the medication. We will need to send a copy of 2017 tax documents. After the documents have been received, the foundation will process the application.  Spoke to patient who states that she will fax a copy of her 2017, 1040 tax return for to the clinic tomorrow. Once we receive it, we will fax it to the foundation to be process. Patient voiced understanding and denies and questions at this time  CarnuelHopkins, Mayer Maskerhasta, CPhT 9:00 AM

## 2017-02-15 NOTE — Telephone Encounter (Signed)
Submitted a prior authorization for [place] through Cover My Meds. Will update once we receive a response.   Brandom Kerwin, [place], CPhT 8:37 AM

## 2017-02-21 ENCOUNTER — Telehealth: Payer: Self-pay | Admitting: Pharmacist

## 2017-02-21 NOTE — Telephone Encounter (Signed)
Patient came by office to bring copies of 2018 tax documents. The [place] support application was faxed. We will update patient once we receive a response. Patient voiced understanding and denied any questions at this time.  Rolena Knutson, [place], CPhT 10:18 AM

## 2017-02-21 NOTE — Telephone Encounter (Signed)
I received a letter from [employer] stating patient was not eligible for the patient assistance program as she did not meet income criteria.    I spoke to [place] (reference [id number]) who reports patient was just over the income limit for 2017 (she would have met criteria based on 2016 income).  I asked if there was any way patient could appeal decision.  She stated if income in 2017 was more than anticipated income in 2018 then she can write a letter explaining why.    I informed patient on this.  She reports income in 2017 was higher than usual as she had to draw money out of pension for home repairs.  I advised patient to write a letter explaining this and ask for the decision to be reconsidered.  I provided patient with the mailing address for [employer] Patient Assistance Program for [place] at [address] [address], [place] [address].  I also offered to fax the letter if patient wanted to come by the office.  Patient reports she will work on writing the letter and will mail it.    Elisabeth Most, Pharm.D., BCPS, CPP Clinical Pharmacist Pager: [phone] Phone: [phone] 02/21/2017 3:03 PM

## 2017-02-22 ENCOUNTER — Other Ambulatory Visit: Payer: Self-pay | Admitting: Rheumatology

## 2017-02-22 NOTE — Telephone Encounter (Signed)
ok 

## 2017-02-22 NOTE — Telephone Encounter (Signed)
Last Visit: 02/14/17 Next Visit: 04/08/17 Labs: 01/05/17 CBC/ CMP WNL  Okay to refill MTX?

## 2017-02-23 ENCOUNTER — Telehealth: Payer: Self-pay | Admitting: Rheumatology

## 2017-02-23 NOTE — Telephone Encounter (Signed)
Patient calling back concerning previous call with you.

## 2017-02-23 NOTE — Telephone Encounter (Signed)
I returned patient's call and assisted her in drafting the letter to appeal the decision about [place] patient assistance program.  Patient reports she is going to mail the letter this afternoon.  Advised her to update us once she knows status of appeal.   Lilla Shookachel [place], Pharm.D., BCPS, CPP Clinical Pharmacist Pager: [phone] Phone: [phone] 02/23/2017 1:24 PM

## 2017-03-07 ENCOUNTER — Telehealth: Payer: Self-pay

## 2017-03-07 NOTE — Telephone Encounter (Signed)
Called Otezla Support Plus to check on patient's application status. Spoke with Corrie DandyMary who states that the reconsideration letter has been received. They will need two months of bank statements that shows the monthly deposits. They have tried to reach the patient but did not receive a call back.  Reference number: [id number] Phone: [phone]  Left message for patient to call back to update her.  Haleema Vanderheyden, [place], CPhT 2:51 PM

## 2017-03-07 NOTE — Telephone Encounter (Signed)
Patient returned your call.

## 2017-03-08 NOTE — Telephone Encounter (Signed)
Spoke with patient to update her. She voices understanding and states that she will print and mail the documents to the compay's [place] [address] [place] with [place] Case ID and Reference number. She plans to have it in the mail no late than 03/09/17.  Will update once we receive a response.  Unnamed Hino, [place]asta, CPhT 2:55 PM

## 2017-03-17 NOTE — Telephone Encounter (Signed)
Spoke with Gloris ManchesterVince, a representative from [employer], who states that the bank statements have been received as of 03/16/17. The application will be reviewed and they will contact us via fax and the patient via mail. He could not give me a turnaround time. Will check back and update once we have an answer.   Spoke with patient who voices understanding and denies any questions at this time.  Marytza Grandpre, [place], CPhT 9:46 AM

## 2017-03-25 ENCOUNTER — Telehealth: Payer: Self-pay

## 2017-03-25 NOTE — Telephone Encounter (Signed)
Spoke with Louie CasaGen[place], a representative, to check the status of patient's application. She states that the patient does not qualify due to her annual income.   Reference number: [id number]  Phone number: [phone]  Abran DukeHopkins, Korina Tretter, CPhT 2:39 PM

## 2017-03-25 NOTE — Telephone Encounter (Signed)
I called and spoke to the program to clarify.  I spoke to Chrissie NoaWilliam Merchandiser, retail(reference # [id number]) who reports he can see the application was appealed and escalated to client management.  He reports it is still being reviewed and he is not sure when we will know the outcome.  He said to try to call back Tuesday afternoon to see if there is any update.  He said he may be able to reach out to a colleague at that time to find out more about the status.    I updated patient on the status of the application.  Can you follow up on this on Tuesday afternoon?    Lilla Shookachel Henderson, Pharm.D., BCPS, CPP Clinical Pharmacist Pager: [phone] Phone: [phone] 03/25/2017 3:56 PM

## 2017-03-29 NOTE — Telephone Encounter (Signed)
Spoke with Dorathy DaftKayla, a representative from [employer] support, who states that the reconsideration letter has been submitted and is still pending. She could not give me a turnaround time. Will check back and update once we receive a response.   Conna Terada, Bethaltohasta, CPhT 2:21 PM

## 2017-04-01 ENCOUNTER — Other Ambulatory Visit: Payer: Self-pay | Admitting: Rheumatology

## 2017-04-04 ENCOUNTER — Telehealth: Payer: Self-pay

## 2017-04-04 NOTE — Telephone Encounter (Signed)
Last Visit: 02/14/17 Next Visit: 04/08/17 Labs: 01/05/17 CBC/ CMP WNL  Okay to refill MTX per Dr. Corliss Skainseveshwar

## 2017-04-04 NOTE — Telephone Encounter (Signed)
Received a fax from [employer] Patient Assistance program regarding [name]'s application. She has been approved to receive her medication from the program from 04/04/17 to 10/03/17.    Reference number: [id number] Phone number: [phone]  Will send document to scan center.  Left message for patient to call back   Abran DukeHopkins, Bianka Liberati, CPhT 11:47 AM

## 2017-04-04 NOTE — Progress Notes (Signed)
Office Visit Note  Patient: [name]             Date of Birth: 1945/12/09           MRN: [id number]             PCP: Babs Sciara, MD Referring: Babs Sciara, MD Visit Date: 04/08/2017 Occupation: @GUAROCC @    Subjective:  psorisis.   History of Present Illness: [name] is a 71 y.o. female with history of psoriatic arthritis and psoriasis. She continues to have psoriasis which is been a major issue for her. She's been on methotrexate which is not controlling her psoriasis symptoms. She has been approved for [facility] through patient assistance. She has not started the medication yet. She is not having any joint discomfort. Any other concerns  Activities of Daily Living:  Patient reports morning stiffness for 0 minute.   Patient Denies nocturnal pain.  Difficulty dressing/grooming: Denies Difficulty climbing stairs: Denies Difficulty getting out of chair: Denies Difficulty using hands for taps, buttons, cutlery, and/or writing: Denies   Review of Systems  Constitutional: Negative for fatigue, night sweats, weight gain, weight loss and weakness.  HENT: Negative for mouth sores, trouble swallowing, trouble swallowing, mouth dryness and nose dryness.   Eyes: Negative for pain, redness, visual disturbance and dryness.  Respiratory: Negative for cough, shortness of breath and difficulty breathing.   Cardiovascular: Negative for chest pain, palpitations, hypertension, irregular heartbeat and swelling in legs/feet.  Gastrointestinal: Negative for blood in stool, constipation and diarrhea.  Endocrine: Negative for increased urination.  Genitourinary: Negative for vaginal dryness.  Musculoskeletal: Negative for arthralgias, joint pain, joint swelling, myalgias, muscle weakness, morning stiffness, muscle tenderness and myalgias.  Skin: Positive for rash. Negative for color change, hair loss, skin tightness, ulcers and sensitivity to sunlight.       Psoriasis    Allergic/Immunologic: Negative for susceptible to infections.  Neurological: Negative for dizziness, memory loss and night sweats.  Hematological: Negative for swollen glands.  Psychiatric/Behavioral: Positive for sleep disturbance. Negative for depressed mood. The patient is not nervous/anxious.     PMFS History:  Patient Active Problem List   Diagnosis Date Noted  . History of hyperlipidemia 02/09/2017  . Other psoriasis 12/30/2016  . High risk medications (not anticoagulants) long-term use 12/30/2016  . Psoriatic arthritis (HCC) 12/27/2014  . Vitamin D deficiency 08/06/2013  . Osteopenia 08/06/2013  . Insomnia 02/22/2013  . Hyperlipidemia 01/09/2013  . Anemia 09/12/2012  . Toe cyanosis 08/08/2012  . Osteoarthritis of shoulder 09/15/2010    Past Medical History:  Diagnosis Date  . Hx of colonic polyps   . Hypercholesterolemia   . Psoriatic arthritis (HCC)     Family History  Problem Relation Age of Onset  . Anesthesia problems Mother   . Cancer Mother   . Hypertension Mother   . Cancer Father   . Diabetes Father   . Deep vein thrombosis Father   . Hypertension Father   . Arthritis Unknown        FH  . Diabetes Unknown        FH  . Cancer Unknown        FH  . Hypertension Sister   . Hypertension Brother   . Heart disease Brother   . Migraines Sister   . Migraines Sister   . Hypotension Neg Hx   . Malignant hyperthermia Neg Hx   . Pseudochol deficiency Neg Hx    Past Surgical History:  Procedure Laterality  Date  . ABDOMINAL HYSTERECTOMY    . COLONOSCOPY  08/25/2011   Procedure: COLONOSCOPY;  Surgeon: Malissa HippoNajeeb U Rehman, MD;  Location: [facility];  Service: Endoscopy;  Laterality: N/A;  12:00  . left knee stable out  1966  . left knee staple in  1962  . VESICOVAGINAL FISTULA CLOSURE W/ TAH  1986   Social History   Social History Narrative  . No narrative on file     Objective: Vital Signs: BP 120/60 (BP Location: Left Arm, Patient Position:  Sitting, Cuff Size: Normal)   Pulse (!) 53   Resp 16   Ht 5\' 2"  (1.575 m)   Wt 154 lb (69.9 kg)   BMI 28.17 kg/m    Physical Exam  Constitutional: She is oriented to person, place, and time. She appears well-developed and well-nourished.  HENT:  Head: Normocephalic and atraumatic.  Eyes: Conjunctivae and EOM are normal.  Neck: Normal range of motion.  Cardiovascular: Normal rate, regular rhythm, normal heart sounds and intact distal pulses.   Pulmonary/Chest: Effort normal and breath sounds normal.  Abdominal: Soft. Bowel sounds are normal.  Lymphadenopathy:    She has no cervical adenopathy.  Neurological: She is alert and oriented to person, place, and time.  Skin: Skin is warm and dry. Capillary refill takes less than 2 seconds. Rash noted.  Extensive psoriasis on the trunk and extremities   Psychiatric: She has a normal mood and affect. Her behavior is normal.  Nursing note and vitals reviewed.    Musculoskeletal Exam: C-spine and thoracic lumbar spine good range of motion. Shoulder joints elbow joints wrist joint MCPs PIPs DIPs with good range of motion. She has some DIP PIP thickening consistent with osteoarthritis. No synovitis was noted. Hip joints, knee joints, ankles MTPs PIPs with good range of motion with no synovitis.  CDAI Exam: CDAI Homunculus Exam:   Joint Counts:  CDAI Tender Joint count: 0 CDAI Swollen Joint count: 0  Global Assessments:  Patient Global Assessment: 2 Provider Global Assessment: 2  CDAI Calculated Score: 4    Investigation: No additional findings. CBC Latest Ref Rng & Units 01/05/2017 12/18/2014 08/06/2013  WBC 3.8 - 10.8 K/uL 5.2 4.4 4.5  Hemoglobin 11.7 - 15.5 g/dL 16.112.2 09.611.4 04.512.1  Hematocrit 35.0 - 45.0 % 36.5 34.8 34.5(L)  Platelets 140 - 400 K/uL 228 213 213   CMP Latest Ref Rng & Units 01/05/2017 12/18/2014 08/06/2013  Glucose 65 - 99 mg/dL 75 80 81  BUN 7 - 25 mg/dL 10 9 11   Creatinine 0.60 - 0.93 mg/dL 4.090.85 8.110.79 9.140.81  Sodium 135  - 146 mmol/L 140 142 138  Potassium 3.5 - 5.3 mmol/L 4.4 4.3 4.4  Chloride 98 - 110 mmol/L 106 103 106  CO2 20 - 31 mmol/L 24 25 28   Calcium 8.6 - 10.4 mg/dL 9.4 9.2 9.5  Total Protein 6.1 - 8.1 g/dL 6.8 6.6 -  Total Bilirubin 0.2 - 1.2 mg/dL 0.6 0.2 -  Alkaline Phos 33 - 130 U/L 98 90 -  AST 10 - 35 U/L 26 18 -  ALT 6 - 29 U/L 16 16 -    Imaging: No results found.  Speciality Comments: No specialty comments available.    Procedures:  No procedures performed Allergies: Etodolac   Assessment / Plan:     Visit Diagnoses: Psoriatic arthritis (HCC): She has no synovitis on examination today her arthritis seems to be very well controlled.  Other psoriasis - Extensive disease. [name]. Otezla has been approved  through patient assistance. Although she has not received the medication yet. I went over the side effects again today with her. I will give her a sample starter pack for otezla. I did advise her in case she gets nausea or diarrhea and she can contact us.  High risk medications (not anticoagulants) long-term use - Methotrexate 7 tablets by mouth every week, folic acid 2 mg by mouth daily - Plan: CBC with Differential/Platelet, COMPLETE METABOLIC PANEL WITH GFR, CBC with Differential/Platelet, COMPLETE METABOLIC PANEL WITH GFR  Osteopenia of multiple sites: She is on calcium and vitamin D  Vitamin D deficiency - Plan: VITAMIN D 25 Hydroxy (Vit-D Deficiency, Fractures)  Primary osteoarthritis of right shoulder  Primary insomnia  History of hyperlipidemia  History of anemia    Orders: Orders Placed This Encounter  Procedures  . CBC with Differential/Platelet  . COMPLETE METABOLIC PANEL WITH GFR  . VITAMIN D 25 Hydroxy (Vit-D Deficiency, Fractures)  . CBC with Differential/Platelet  . COMPLETE METABOLIC PANEL WITH GFR   No orders of the defined types were placed in this encounter.   Face-to-face time spent with patient was 30 minutes. 50% of time was spent in counseling  and coordination of care.  Follow-Up Instructions: Return in about 3 months (around 07/09/2017) for Psoriatic arthritis psoriasis, osteoarthritis.   Pollyann Savoy, MD  Note - This record has been created using Animal nutritionist.  Chart creation errors have been sought, but may not always  have been located. Such creation errors do not reflect on  the standard of medical care.

## 2017-04-05 ENCOUNTER — Telehealth: Payer: Self-pay | Admitting: Rheumatology

## 2017-04-05 DIAGNOSIS — Z79899 Other long term (current) drug therapy: Secondary | ICD-10-CM

## 2017-04-05 NOTE — Telephone Encounter (Signed)
Patient going to [facility] in [place] on the 5th. Request orders be sent in for her. Please call patient if you have questions.

## 2017-04-05 NOTE — Telephone Encounter (Signed)
Patient returned call. Gave her the update and the phone number to the assistance program. Patient will call them to get herself set up for shipment. She voices understanding and denies any questions at this time.  Ashad Fawbush, Cashhasta, CPhT 4:09 PM

## 2017-04-07 NOTE — Telephone Encounter (Signed)
Lab Orders released and faxed.  °

## 2017-04-08 ENCOUNTER — Encounter: Payer: Self-pay | Admitting: Rheumatology

## 2017-04-08 ENCOUNTER — Ambulatory Visit (INDEPENDENT_AMBULATORY_CARE_PROVIDER_SITE_OTHER): Payer: Medicare Other | Admitting: Rheumatology

## 2017-04-08 VITALS — BP 120/60 | HR 53 | Resp 16 | Ht 62.0 in | Wt 154.0 lb

## 2017-04-08 DIAGNOSIS — L4050 Arthropathic psoriasis, unspecified: Secondary | ICD-10-CM | POA: Diagnosis not present

## 2017-04-08 DIAGNOSIS — M8589 Other specified disorders of bone density and structure, multiple sites: Secondary | ICD-10-CM

## 2017-04-08 DIAGNOSIS — F5101 Primary insomnia: Secondary | ICD-10-CM

## 2017-04-08 DIAGNOSIS — Z79899 Other long term (current) drug therapy: Secondary | ICD-10-CM

## 2017-04-08 DIAGNOSIS — M19011 Primary osteoarthritis, right shoulder: Secondary | ICD-10-CM

## 2017-04-08 DIAGNOSIS — E559 Vitamin D deficiency, unspecified: Secondary | ICD-10-CM | POA: Diagnosis not present

## 2017-04-08 DIAGNOSIS — L408 Other psoriasis: Secondary | ICD-10-CM

## 2017-04-08 DIAGNOSIS — Z862 Personal history of diseases of the blood and blood-forming organs and certain disorders involving the immune mechanism: Secondary | ICD-10-CM

## 2017-04-08 DIAGNOSIS — Z8639 Personal history of other endocrine, nutritional and metabolic disease: Secondary | ICD-10-CM

## 2017-04-08 LAB — COMPLETE METABOLIC PANEL WITH GFR
ALBUMIN: 4.1 g/dL (ref 3.6–5.1)
ALK PHOS: 105 U/L (ref 33–130)
ALT: 12 U/L (ref 6–29)
AST: 19 U/L (ref 10–35)
BUN: 11 mg/dL (ref 7–25)
CALCIUM: 9.3 mg/dL (ref 8.6–10.4)
CHLORIDE: 105 mmol/L (ref 98–110)
CO2: 26 mmol/L (ref 20–31)
Creat: 0.86 mg/dL (ref 0.60–0.93)
GFR, EST AFRICAN AMERICAN: 79 mL/min (ref >=60)
GFR, EST NON AFRICAN AMERICAN: 69 mL/min (ref >=60)
Glucose, Bld: 71 mg/dL (ref 65–99)
POTASSIUM: 4.1 mmol/L (ref 3.5–5.3)
Sodium: 139 mmol/L (ref 135–146)
Total Bilirubin: 0.4 mg/dL (ref 0.2–1.2)
Total Protein: 6.9 g/dL (ref 6.1–8.1)

## 2017-04-08 LAB — CBC WITH DIFFERENTIAL/PLATELET
Basophils Absolute: 0 cells/uL (ref 0–200)
Basophils Relative: 0 %
Eosinophils Absolute: 64 cells/uL (ref 15–500)
Eosinophils Relative: 1 %
HEMATOCRIT: 35.7 % (ref 35.0–45.0)
Hemoglobin: 11.7 g/dL (ref 11.7–15.5)
LYMPHS PCT: 39 %
Lymphs Abs: 2496 cells/uL (ref 850–3900)
MCH: 24.6 pg — ABNORMAL LOW (ref 27.0–33.0)
MCHC: 32.8 g/dL (ref 32.0–36.0)
MCV: 75.2 fL — AB (ref 80.0–100.0)
MONO ABS: 512 {cells}/uL (ref 200–950)
MONOS PCT: 8 %
MPV: 11.2 fL (ref 7.5–12.5)
NEUTROS PCT: 52 %
Neutro Abs: 3328 cells/uL (ref 1500–7800)
Platelets: 259 10*3/uL (ref 140–400)
RBC: 4.75 MIL/uL (ref 3.80–5.10)
RDW: 16.3 % — AB (ref 11.0–15.0)
WBC: 6.4 10*3/uL (ref 3.8–10.8)

## 2017-04-08 NOTE — Patient Instructions (Signed)
Standing Labs We placed an order today for your standing lab work.    Please come back and get your standing labs in October and every 3 months  We have open lab Monday through Friday from 8:30-11:30 AM and 1:30-4 PM at the office of Dr. Geanine Vandekamp.   The office is located at [address], [address], [place], [place] [address] No appointment is necessary.   Labs are drawn by [facility].  You may receive a bill from [facility] for your lab work. If you have any questions regarding directions or hours of operation,  please call [phone].    

## 2017-04-09 LAB — VITAMIN D 25 HYDROXY (VIT D DEFICIENCY, FRACTURES): VIT D 25 HYDROXY: 24 ng/mL — AB (ref 30–100)

## 2017-04-11 ENCOUNTER — Telehealth: Payer: Self-pay | Admitting: Pharmacist

## 2017-04-11 NOTE — Telephone Encounter (Signed)
I was informed that patient had questions regarding Otezla.  I called patient to discuss Otezla.  Counseled patient on purpose, proper use, and adverse effects of Otezla.  Reviewed the most common adverse effects of weight loss, depression, nausea/diarrhea/vomiting, headaches, and nasal congestion.  Patient voiced understanding and denies any further questions or concerns regarding Otezla at this time.   Lilla Shookachel Henderson, Pharm.D., BCPS, CPP Clinical Pharmacist Pager: [phone] Phone: [phone] 04/11/2017 8:47 AM

## 2017-04-11 NOTE — Progress Notes (Signed)
Vit D 50,000U q wk #90 d , check Vit D in 

## 2017-04-13 ENCOUNTER — Telehealth: Payer: Self-pay | Admitting: *Deleted

## 2017-04-13 DIAGNOSIS — E559 Vitamin D deficiency, unspecified: Secondary | ICD-10-CM

## 2017-04-13 MED ORDER — VITAMIN D (ERGOCALCIFEROL) 1.25 MG (50000 UNIT) PO CAPS: 50000.0000 [IU] | ORAL_CAPSULE | ORAL | 0 refills | Status: DC

## 2017-04-13 NOTE — Telephone Encounter (Signed)
-----   Message from Pollyann SavoyShaili Deveshwar, MD sent at 04/11/2017  8:39 AM EDT ----- Vit D 50,000U q wk #90 d , check Vit D in 3mths

## 2017-04-21 ENCOUNTER — Telehealth: Payer: Self-pay | Admitting: Family Medicine

## 2017-04-21 DIAGNOSIS — E785 Hyperlipidemia, unspecified: Secondary | ICD-10-CM

## 2017-04-21 DIAGNOSIS — D649 Anemia, unspecified: Secondary | ICD-10-CM

## 2017-04-21 NOTE — Telephone Encounter (Signed)
Please also make sure TIBC is part of this.

## 2017-04-21 NOTE — Telephone Encounter (Signed)
Patient wants to know if she is due for blood work before her upcoming appointment on 05/12/17 with Dr. Lorin PicketScott?

## 2017-04-21 NOTE — Telephone Encounter (Signed)
Her rheumatologist did a fair number of lab work. Therefore I recommend lipid profile, ferritin-hyperlipidemia, microcytic anemia

## 2017-04-21 NOTE — Telephone Encounter (Signed)
Lipid, Liver and Met 7 -04/2016

## 2017-04-22 NOTE — Telephone Encounter (Signed)
Blood work ordered in EPIC. Patient notified. 

## 2017-04-28 ENCOUNTER — Telehealth: Payer: Self-pay | Admitting: Pharmacist

## 2017-04-28 NOTE — Telephone Encounter (Signed)
I received a call from Jefm Bryantatherine Netter regarding her [place].  She reports she received a sample from our office for 30 mg tablets and she received a package from the patient assistance program with 30 mg tablets, but she reports she read she needs to start with 10 mg and she does not have those.  I reviewed that she should have received a starter pack from the patient assistance program (prescription was sent in for starter pack and maintenance pack).  Patient states she received two boxes from the assistance program and only opened one.  She will look at the other when she gets home to see if it is the starter pack.  She will call me back.   Lilla Shookachel Henderson, Pharm.D., BCPS, CPP Clinical Pharmacist Pager: [phone] Phone: [phone] 04/28/2017 11:00 AM

## 2017-04-28 NOTE — Telephone Encounter (Signed)
I received a return call from patient.  She confirms she received Otezla starter pack.  She plans to start the medication today.   Lilla Shookachel Issai Werling, Pharm.D., BCPS, CPP Clinical Pharmacist Pager: [phone] Phone: [phone] 04/28/2017 11:23 AM

## 2017-05-11 LAB — IRON AND TIBC
IRON: 71 ug/dL (ref 27–139)
Iron Saturation: 24 % (ref 15–55)
Total Iron Binding Capacity: 300 ug/dL (ref 250–450)
UIBC: 229 ug/dL (ref 118–369)

## 2017-05-11 LAB — LIPID PANEL
CHOLESTEROL TOTAL: 209 mg/dL — AB (ref 100–199)
Chol/HDL Ratio: 3.1 ratio (ref 0.0–4.4)
HDL: 68 mg/dL (ref >39)
LDL Calculated: 127 mg/dL — ABNORMAL HIGH (ref 0–99)
Triglycerides: 72 mg/dL (ref 0–149)
VLDL Cholesterol Cal: 14 mg/dL (ref 5–40)

## 2017-05-11 LAB — FERRITIN: Ferritin: 132 ng/mL (ref 15–150)

## 2017-05-12 ENCOUNTER — Encounter: Payer: Self-pay | Admitting: Family Medicine

## 2017-05-12 ENCOUNTER — Ambulatory Visit (INDEPENDENT_AMBULATORY_CARE_PROVIDER_SITE_OTHER): Payer: Medicare Other | Admitting: Family Medicine

## 2017-05-12 VITALS — BP 110/70 | Ht 62.0 in | Wt 150.0 lb

## 2017-05-12 DIAGNOSIS — Z0000 Encounter for general adult medical examination without abnormal findings: Secondary | ICD-10-CM

## 2017-05-12 NOTE — Progress Notes (Signed)
Should be 7 yrs

## 2017-05-12 NOTE — Progress Notes (Addendum)
   Subjective:    Patient ID: [name], female    DOB: 06-25-1946, 71 y.o.   MRN: [id number]  HPI AWV- Annual Wellness Visit Patient has psoriasis followed by specialist they have her on [place] The patient was seen for their annual wellness visit. The patient's past medical history, surgical history, and family history were reviewed. Pertinent vaccines were reviewed ( tetanus, pneumonia, shingles, flu) The patient's medication list was reviewed and updated.  The height and weight were entered. The patient's current BMI is:27.5  Cognitive screening was completed. Outcome of Mini - Cog: pass  Falls within the past 6 months:none  Current tobacco usage: none (All patients who use tobacco were given written and verbal information on quitting)  Recent listing of emergency department/hospitalizations over the past year were reviewed.  current specialist the patient sees on a regular basis: rheumatologist   Medicare annual wellness visit patient questionnaire was reviewed.  A written screening schedule for the patient for the next 5-10 years was given. Appropriate discussion of followup regarding next visit was discussed.       Review of Systems  Constitutional: Negative for activity change, appetite change and fatigue.  HENT: Negative for congestion, ear discharge and rhinorrhea.   Eyes: Negative for discharge.  Respiratory: Negative for cough, chest tightness and wheezing.   Cardiovascular: Negative for chest pain.  Gastrointestinal: Negative for abdominal pain and vomiting.  Genitourinary: Negative for difficulty urinating and frequency.  Musculoskeletal: Negative for neck pain.  Allergic/Immunologic: Negative for environmental allergies and food allergies.  Neurological: Negative for weakness and headaches.  Psychiatric/Behavioral: Negative for agitation and behavioral problems.       Objective:   Physical Exam  Constitutional: She is oriented to person,  place, and time. She appears well-developed and well-nourished.  HENT:  Head: Normocephalic.  Right Ear: External ear normal.  Left Ear: External ear normal.  Eyes: Pupils are equal, round, and reactive to light.  Neck: Normal range of motion. No thyromegaly present.  Cardiovascular: Normal rate, regular rhythm, normal heart sounds and intact distal pulses.   No murmur heard. Pulmonary/Chest: Effort normal and breath sounds normal. No respiratory distress. She has no wheezes.  Abdominal: Soft. Bowel sounds are normal. She exhibits no distension and no mass. There is no tenderness.  Musculoskeletal: Normal range of motion. She exhibits no edema or tenderness.  Lymphadenopathy:    She has no cervical adenopathy.  Neurological: She is alert and oriented to person, place, and time. She exhibits normal muscle tone.  Skin: Skin is warm and dry.  Psychiatric: She has a normal mood and affect. Her behavior is normal.   Patient defers on breast and pelvic exam Pap smear not indicated Up-to-date on mammogram        Assessment & Plan:  Adult wellness-complete.wellness physical was conducted today. Importance of diet and exercise were discussed in detail. In addition to this a discussion regarding safety was also covered. We also reviewed over immunizations and gave recommendations regarding current immunization needed for age. In addition to this additional areas were also touched on including: Preventative health exams needed: Colonoscopy -We touch base with her gastroenterology group. The PA stated that her next colonoscopy would be 7 years from the previous one. Therefore her next colonoscopy 2019.[name]( Terri Setzer via message)  Patient was advised yearly wellness exam

## 2017-05-13 ENCOUNTER — Encounter: Payer: Self-pay | Admitting: Family Medicine

## 2017-07-07 NOTE — Progress Notes (Signed)
Office Visit Note  Patient: [name]             Date of Birth: [birth year]           MRN: [id number]             PCP: Babs Sciara, MD Referring: Babs Sciara, MD Visit Date: 07/14/2017 Occupation: @    Subjective:  Psoriasis.   History of Present Illness: [name] is a 71 y.o. female with history of psoriatic arthritis and psoriasis. She's been on combination therapy between methotrexate and otezla. She has noticed improvement in her joint symptoms without much joint discomfort now. Her psoriasis is also clearing up. She had some nausea with otezla initially which is getting better. Her right shoulder is much better.  Activities of Daily Living:  Patient reports morning stiffness for minute.   Patient Denies nocturnal pain.  Difficulty dressing/grooming: Denies Difficulty climbing stairs: Denies Difficulty getting out of chair: Denies Difficulty using hands for taps, buttons, cutlery, and/or writing: Denies   Review of Systems  Constitutional: Negative for fatigue, night sweats, weight gain, weight loss and weakness.  HENT: Negative for mouth sores, trouble swallowing, trouble swallowing, mouth dryness and nose dryness.   Eyes: Negative for pain, redness, visual disturbance and dryness.  Respiratory: Negative for cough, shortness of breath and difficulty breathing.   Cardiovascular: Negative for chest pain, palpitations, hypertension, irregular heartbeat and swelling in legs/feet.  Gastrointestinal: Negative for blood in stool, constipation and diarrhea.  Endocrine: Negative for increased urination.  Genitourinary: Negative for vaginal dryness.  Musculoskeletal: Negative for arthralgias, joint pain, joint swelling, myalgias, muscle weakness, morning stiffness, muscle tenderness and myalgias.  Skin: Negative for color change, rash, hair loss, skin tightness, ulcers and sensitivity to sunlight.  Allergic/Immunologic: Negative for susceptible  to infections.  Neurological: Negative for dizziness, memory loss and night sweats.  Hematological: Negative for swollen glands.  Psychiatric/Behavioral: Negative for depressed mood and sleep disturbance. The patient is not nervous/anxious.     PMFS History:  Patient Active Problem List   Diagnosis Date Noted  . History of hyperlipidemia 02/09/2017  . Other psoriasis 12/30/2016  . High risk medications (not anticoagulants) long-term use 12/30/2016  . Psoriatic arthritis (HCC) 12/27/2014  . Vitamin D deficiency 08/06/2013  . Osteopenia 08/06/2013  . Insomnia 02/22/2013  . Hyperlipidemia 01/09/2013  . Anemia 09/12/2012  . Toe cyanosis 08/08/2012  . Osteoarthritis of shoulder 09/15/2010    Past Medical History:  Diagnosis Date  . Hx of colonic polyps   . Hypercholesterolemia   . Psoriatic arthritis (HCC)     Family History  Problem Relation Age of Onset  . Anesthesia problems Mother   . Cancer Mother   . Hypertension Mother   . Cancer Father   . Diabetes Father   . Deep vein thrombosis Father   . Hypertension Father   . Arthritis Unknown        FH  . Diabetes Unknown        FH  . Cancer Unknown        FH  . Hypertension Sister   . Hypertension Brother   . Heart disease Brother   . Migraines Sister   . Migraines Sister   . Hypotension Neg Hx   . Malignant hyperthermia Neg Hx   . Pseudochol deficiency Neg Hx    Past Surgical History:  Procedure Laterality Date  . ABDOMINAL HYSTERECTOMY    . COLONOSCOPY  08/25/2011   Procedure: COLONOSCOPY;  Surgeon: Malissa Hippo, MD;  Location: [facility];  Service: Endoscopy;  Laterality: N/A;  12:00  . left knee stable out  1966  . left knee staple in  1962  . VESICOVAGINAL FISTULA CLOSURE W/ TAH  1986   Social History   Social History Narrative  . No narrative on file     Objective: Vital Signs: BP 126/63 (BP Location: Left Arm, Patient Position: Sitting, Cuff Size: Large)   Pulse (!) 53   Resp 12   Ht   (1.575 m)   Wt 150 lb (68 kg)   BMI 27.44 kg/m    Physical Exam  Constitutional: She is oriented to person, place, and time. She appears well-developed and well-nourished.  HENT:  Head: Normocephalic and atraumatic.  Eyes: Conjunctivae and EOM are normal.  Neck: Normal range of motion.  Cardiovascular: Normal rate, regular rhythm, normal heart sounds and intact distal pulses.   Pulmonary/Chest: Effort normal and breath sounds normal.  Abdominal: Soft. Bowel sounds are normal.  Lymphadenopathy:    She has no cervical adenopathy.  Neurological: She is alert and oriented to person, place, and time.  Skin: Skin is warm and dry. Capillary refill takes less than 2 seconds.  Hyperpigmented lesions from previous psoriasis rash.  Psychiatric: She has a normal mood and affect. Her behavior is normal.  Nursing note and vitals reviewed.    Musculoskeletal Exam: C-spine and thoracic lumbar spine good range of motion. She had no SI joint tenderness. Shoulder joints elbow joints wrist joints are good range of motion. She is some synovial thickening over PIP/DIP joints but no synovitis was noted. Hip joints knee joints ankles MTPs PIPs DIPs with good range of motion with no synovitis.  CDAI Exam: No CDAI exam completed.    Investigation: No additional findings. CBC Latest Ref Rng & Units 04/08/2017 01/05/2017 12/18/2014  WBC 3.8 - 10.8 K/uL 6.4 5.2 4.4  Hemoglobin 11.7 - 15.5 g/dL 16.1 09.6 04.5  Hematocrit 35.0 - 45.0 % 35.7 36.5 34.8  Platelets 140 - 400 K/uL 259 228 213   CMP Latest Ref Rng & Units 04/08/2017 01/05/2017 12/18/2014  Glucose 65 - 99 mg/dL 71 75 80  BUN 7 - 25 mg/dL Creatinine 0.60 - 0.93 mg/dL 4.09 8.11 9.14  Sodium 135 - 146 mmol/L 139 140 142  Potassium 3.5 - 5.3 mmol/L 4.1 4.4 4.3  Chloride 98 - 110 mmol/L 105 106 103  CO2 20 - 31 mmol/L Calcium 8.6 - 10.4 mg/dL 9.3 9.4 9.2  Total Protein 6.1 - 8.1 g/dL 6.9 6.8 6.6  Total Bilirubin 0.2 - 1.2 mg/dL 0.4 0.6  0.2  Alkaline Phos 33 - 130 U/L 105 98 90  AST 10 - 35 U/L ALT 6 - 29 U/L Imaging: No results found.  Speciality Comments: No specialty comments available.    Procedures:  No procedures performed Allergies: Etodolac   Assessment / Plan:     Visit Diagnoses: Psoriatic arthritis [facility]): Patient has no synovitis on examination.  Other psoriasis: She has no active psoriasis lesions. She has mostly hyperpigmentation from previous psoriasis lesions.  High risk medications long-term use - Methotrexate 7 tablets by mouth every week, folic acid 2 mg by mouth daily and Otezla 30 mg by mouth twice a day. Her labs have been stable we'll continue to monitor her labs every 3 months.  Vitamin D deficiency -she finished her  course of vitamin D for 3 months we will check her levels today. I've advised her to stay on vitamin D 2000 units a day. Plan: VITAMIN D 25 Hydroxy (Vit-D Deficiency, Fractures).  Osteopenia of multiple sites: She should take calcium and vitamin D and regular basis.  Her other medical problems are listed as follows:  History of hyperlipidemia  Primary insomnia  History of anemia  Orders: No orders of the defined types were placed in this encounter.  No orders of the defined types were placed in this encounter.   Face-to-face time spent with patient was 30 minutes. Greater than 50% of time was spent in counseling and coordination of care.  Follow-Up Instructions: Return in about 5 months (around 12/12/2017) for Psoriatic arthritis, psoriasis.   Pollyann Savoy, MD  Note - This record has been created using Animal nutritionist.  Chart creation errors have been sought, but may not always  have been located. Such creation errors do not reflect on  the standard of medical care.

## 2017-07-14 ENCOUNTER — Encounter: Payer: Self-pay | Admitting: Rheumatology

## 2017-07-14 ENCOUNTER — Ambulatory Visit (INDEPENDENT_AMBULATORY_CARE_PROVIDER_SITE_OTHER): Payer: Medicare Other | Admitting: Rheumatology

## 2017-07-14 VITALS — BP 126/63 | HR 53 | Resp 12 | Ht 62.0 in | Wt 150.0 lb

## 2017-07-14 DIAGNOSIS — F5101 Primary insomnia: Secondary | ICD-10-CM

## 2017-07-14 DIAGNOSIS — Z8639 Personal history of other endocrine, nutritional and metabolic disease: Secondary | ICD-10-CM

## 2017-07-14 DIAGNOSIS — Z79899 Other long term (current) drug therapy: Secondary | ICD-10-CM

## 2017-07-14 DIAGNOSIS — Z862 Personal history of diseases of the blood and blood-forming organs and certain disorders involving the immune mechanism: Secondary | ICD-10-CM

## 2017-07-14 DIAGNOSIS — L4050 Arthropathic psoriasis, unspecified: Secondary | ICD-10-CM | POA: Diagnosis not present

## 2017-07-14 DIAGNOSIS — E559 Vitamin D deficiency, unspecified: Secondary | ICD-10-CM | POA: Diagnosis not present

## 2017-07-14 DIAGNOSIS — L408 Other psoriasis: Secondary | ICD-10-CM

## 2017-07-14 DIAGNOSIS — M8589 Other specified disorders of bone density and structure, multiple sites: Secondary | ICD-10-CM

## 2017-07-14 NOTE — Patient Instructions (Signed)
Standing Labs We placed an order today for your standing lab work.    Please come back and get your standing labs in January and every 3 months  We have open lab Monday through Friday from 8:30-11:30 AM and 1:30-4 PM at the office of Dr. Isaiyah Feldhaus.   The office is located at [address], [address], [place], [place] [address] No appointment is necessary.   Labs are drawn by [facility].  You may receive a bill from [facility] for your lab work. If you have any questions regarding directions or hours of operation,  please call [phone].    

## 2017-07-15 LAB — COMPLETE METABOLIC PANEL WITH GFR
AG RATIO: 1.6 (calc) (ref 1.0–2.5)
ALT: 37 U/L — ABNORMAL HIGH (ref 6–29)
AST: 37 U/L — ABNORMAL HIGH (ref 10–35)
Albumin: 4.2 g/dL (ref 3.6–5.1)
Alkaline phosphatase (APISO): 98 U/L (ref 33–130)
BUN: 12 mg/dL (ref 7–25)
CALCIUM: 9.6 mg/dL (ref 8.6–10.4)
CO2: 27 mmol/L (ref 20–32)
CREATININE: 0.84 mg/dL (ref 0.60–0.93)
Chloride: 105 mmol/L (ref 98–110)
GFR, EST NON AFRICAN AMERICAN: 70 mL/min/{1.73_m2} (ref >=60)
GFR, Est African American: 82 mL/min/{1.73_m2} (ref >=60)
GLOBULIN: 2.7 g/dL (ref 1.9–3.7)
Glucose, Bld: 81 mg/dL (ref 65–99)
POTASSIUM: 4.1 mmol/L (ref 3.5–5.3)
SODIUM: 140 mmol/L (ref 135–146)
TOTAL PROTEIN: 6.9 g/dL (ref 6.1–8.1)
Total Bilirubin: 0.5 mg/dL (ref 0.2–1.2)

## 2017-07-15 LAB — CBC WITH DIFFERENTIAL/PLATELET
BASOS ABS: 32 {cells}/uL (ref 0–200)
Basophils Relative: 0.6 %
EOS PCT: 0.9 %
Eosinophils Absolute: 48 cells/uL (ref 15–500)
HEMATOCRIT: 33.8 % — AB (ref 35.0–45.0)
Hemoglobin: 11.1 g/dL — ABNORMAL LOW (ref 11.7–15.5)
LYMPHS ABS: 2724 {cells}/uL (ref 850–3900)
MCH: 24.8 pg — ABNORMAL LOW (ref 27.0–33.0)
MCHC: 32.8 g/dL (ref 32.0–36.0)
MCV: 75.4 fL — AB (ref 80.0–100.0)
MPV: 11.8 fL (ref 7.5–12.5)
Monocytes Relative: 5.4 %
NEUTROS ABS: 2210 {cells}/uL (ref 1500–7800)
NEUTROS PCT: 41.7 %
Platelets: 247 10*3/uL (ref 140–400)
RBC: 4.48 10*6/uL (ref 3.80–5.10)
RDW: 16.9 % — ABNORMAL HIGH (ref 11.0–15.0)
Total Lymphocyte: 51.4 %
WBC: 5.3 10*3/uL (ref 3.8–10.8)
WBCMIX: 286 {cells}/uL (ref 200–950)

## 2017-07-15 LAB — VITAMIN D 25 HYDROXY (VIT D DEFICIENCY, FRACTURES): Vit D, 25-Hydroxy: 44 ng/mL (ref 30–100)

## 2017-07-15 NOTE — Progress Notes (Signed)
LFTs are mildly elevated. Please ask patient to avoid NSAIDs and alcohol. We will recheck labs as scheduled.

## 2017-07-18 ENCOUNTER — Other Ambulatory Visit: Payer: Self-pay | Admitting: Rheumatology

## 2017-07-18 NOTE — Telephone Encounter (Signed)
Last visit: 07/14/17 Next visit: 12/13/17 Labs: 07/14/17 LFTs are mildly elevated  Ok to refill per Dr. Corliss Skains.

## 2017-07-18 NOTE — Telephone Encounter (Signed)
Last visit: 07/14/17 Next visit: 12/13/17  Ok to refill per Dr. Corliss Skains.

## 2017-07-19 ENCOUNTER — Telehealth: Payer: Self-pay

## 2017-07-19 NOTE — Telephone Encounter (Signed)
Patient was seen in office on 07/15/17. A renewal application for [facility] patient assistance was filled out and signed. Due to the fax machine not working, I was able to fax the application to [employer]) from the [place] office. Will update once we receive a response.   Phone number: [phone] Fax: [phone]  Abran Duke, CPhT 8:52 AM

## 2017-08-08 NOTE — Telephone Encounter (Addendum)
Called [employer] [place] support to check status of patient applications. Spoke to [place] who states that the patient is ineligible because she is over the income limit. The application that was submitted in July received the same response and patient sent in a reconsideration letter explaining why she needed the assistance. Dorathy DaftKayla states that patient is able to do the same but she is unsure if patient will be approved again. Patient can fax letter to [phone] with a cover letter with her name, date of birth and reference number: [id number].   Called patient to update. No answer. Had to leave a message.   Letesha Klecker, [place], CPhT 3:23 PM

## 2017-08-10 NOTE — Telephone Encounter (Signed)
Patient returned call. Informed her of the update. She will write the foundation a letter explaining why she needs the assistance and mail to them.   Will update once we receive a response.   Connar Keating, [place], CPhT 2:41 PM

## 2017-09-09 ENCOUNTER — Telehealth: Payer: Self-pay

## 2017-09-09 ENCOUNTER — Telehealth: Payer: Self-pay | Admitting: Rheumatology

## 2017-09-09 NOTE — Telephone Encounter (Signed)
Patient left a message requesting a return phone call from [place].  CB#[phone].  Thank you.

## 2017-09-09 NOTE — Telephone Encounter (Signed)
Patient called to say that she was working on her letter to send to [employer] patient Assistance for [place] assistance for 2019. She verified the reference number *see previous call* and confirmed that she had the address to mail it to. Patient is working on the letter now and will send in the mail shortly.   Will update once we receive a response.   Jaren Kearn, [place], CPhT 11:12 AM

## 2017-09-20 NOTE — Telephone Encounter (Signed)
Called patient to follow up with her [employer] patient assistance application. Patient states that she mailed her letter in on 09/09/17. Will update once we receive a response.   Kamonte Mcmichen, [place], CPhT 8:30 AM

## 2017-09-30 NOTE — Telephone Encounter (Signed)
Called [employer] patient assistance foundation to check the status of patients application. Spoke with representative who states that they have received the pts reconsideration letter and income statements. It is currently pending with the management for review. She could not give me a turnaround time. Will update once we receive a response.   Reference number: [id number] Phone: [phone]  Called patient to update.  Left message.  Seferina Brokaw, [place], CPhT 11:37 AM

## 2017-10-13 ENCOUNTER — Other Ambulatory Visit: Payer: Self-pay

## 2017-10-13 ENCOUNTER — Telehealth: Payer: Self-pay

## 2017-10-13 DIAGNOSIS — Z79899 Other long term (current) drug therapy: Secondary | ICD-10-CM

## 2017-10-13 LAB — COMPLETE METABOLIC PANEL WITH GFR
AG RATIO: 1.6 (calc) (ref 1.0–2.5)
ALBUMIN MSPROF: 4.2 g/dL (ref 3.6–5.1)
ALT: 15 U/L (ref 6–29)
AST: 23 U/L (ref 10–35)
Alkaline phosphatase (APISO): 100 U/L (ref 33–130)
BILIRUBIN TOTAL: 0.6 mg/dL (ref 0.2–1.2)
BUN: 14 mg/dL (ref 7–25)
CHLORIDE: 106 mmol/L (ref 98–110)
CO2: 27 mmol/L (ref 20–32)
Calcium: 9.8 mg/dL (ref 8.6–10.4)
Creat: 0.89 mg/dL (ref 0.60–0.93)
GFR, EST AFRICAN AMERICAN: 76 mL/min/{1.73_m2} (ref >=60)
GFR, Est Non African American: 65 mL/min/{1.73_m2} (ref >=60)
GLOBULIN: 2.6 g/dL (ref 1.9–3.7)
GLUCOSE: 82 mg/dL (ref 65–99)
POTASSIUM: 4.1 mmol/L (ref 3.5–5.3)
SODIUM: 140 mmol/L (ref 135–146)
TOTAL PROTEIN: 6.8 g/dL (ref 6.1–8.1)

## 2017-10-13 LAB — CBC WITH DIFFERENTIAL/PLATELET
BASOS ABS: 41 {cells}/uL (ref 0–200)
Basophils Relative: 0.7 %
EOS PCT: 1 %
Eosinophils Absolute: 58 cells/uL (ref 15–500)
HEMATOCRIT: 36.7 % (ref 35.0–45.0)
Hemoglobin: 11.9 g/dL (ref 11.7–15.5)
LYMPHS ABS: 2651 {cells}/uL (ref 850–3900)
MCH: 24 pg — ABNORMAL LOW (ref 27.0–33.0)
MCHC: 32.4 g/dL (ref 32.0–36.0)
MCV: 74 fL — ABNORMAL LOW (ref 80.0–100.0)
MONOS PCT: 7.1 %
MPV: 11.7 fL (ref 7.5–12.5)
NEUTROS PCT: 45.5 %
Neutro Abs: 2639 cells/uL (ref 1500–7800)
PLATELETS: 229 10*3/uL (ref 140–400)
RBC: 4.96 10*6/uL (ref 3.80–5.10)
RDW: 14.9 % (ref 11.0–15.0)
TOTAL LYMPHOCYTE: 45.7 %
WBC mixed population: 412 cells/uL (ref 200–950)
WBC: 5.8 10*3/uL (ref 3.8–10.8)

## 2017-10-13 NOTE — Telephone Encounter (Signed)
Patient came by clinic to pick up samples of Otezla. Okayed by Dr. Corliss Skainseveshwar, signed out by Dulcy FannyAndrea Hatton, LPN  Medication Samples have been provided to the patient.  Drug name: [place]       Strength: StarterKits        Qty: 2  LOT: [id number]  Exp.Date: 10/2017  Dosing instructions: Take 30mg  by mouth twice daily  The patient has been instructed regarding the correct time, dose, and frequency of taking this medication, including desired effects and most common side effects.   [name], [place], CPhT 10:24 AM

## 2017-10-14 NOTE — Progress Notes (Signed)
wnl

## 2017-10-25 ENCOUNTER — Telehealth: Payer: Self-pay | Admitting: Rheumatology

## 2017-10-25 ENCOUNTER — Telehealth: Payer: Self-pay

## 2017-10-25 NOTE — Telephone Encounter (Signed)
Called [employer] Support to check the status up pts application. Spoke with Montel ClockKierra who states that the pt needs to submit 2 months of bank statements and an IRA documents showing a 1 time withdrawal. Information can be faxed to [phone] with Reference number: [id number]. After information has been received it will go to the claim management department for review. She could not give me a turnaround time.   Called pt to update. Left voicemail.  Taytum Scheck, [place], CPhT 9:48 AM

## 2017-10-25 NOTE — Telephone Encounter (Signed)
Patient left a voicemail returning Chasta's call.  CB# [phone]

## 2017-10-25 NOTE — Telephone Encounter (Signed)
Patient returned call. She states she will gather the documents and try to mail or fax them in. Gave her the fax information and address.   Will update once we receive a response.  [name], [place], CPhT 1:41 PM

## 2017-10-27 ENCOUNTER — Telehealth: Payer: Self-pay | Admitting: Rheumatology

## 2017-10-27 NOTE — Telephone Encounter (Signed)
Patient left a voicemail returning your call.  Patient's CB# [phone]

## 2017-10-28 ENCOUNTER — Telehealth: Payer: Self-pay | Admitting: Rheumatology

## 2017-10-28 NOTE — Telephone Encounter (Signed)
Returned pts call. Left voicemail.   [name], [place], CPhT 2:06 PM

## 2017-10-28 NOTE — Telephone Encounter (Signed)
Patient left a Psychologist, occupationalvoicemail apologizing for missing your call.  CB# [phone]

## 2017-10-28 NOTE — Telephone Encounter (Signed)
Returned pts call. Voicemail is not set up. Could not leave a message.   [name], [place], CPhT 9:57 AM

## 2017-10-28 NOTE — Telephone Encounter (Signed)
Opened in error

## 2017-11-01 NOTE — Telephone Encounter (Signed)
Spoke with pt. She states that she mailed 3 months (October, November and December) of bank statements and 1099 distrubution from IRA to the foundation on 1/24. She was calling to see if we had heard anything. We have not heard anything but. I will call otezla support to check the status.  Called [place] and spoke with [place]. She states that they received all documents on 10/28/17. The application was escalated but there is no turnaround time. We will be notified by fax and the pt by mail.   Phone: [phone] Reference number: [id number]  Called pt back to update. Patient voices understanding and denies any questions at this time.  Takera Rayl, [place], CPhT 1:33 PM

## 2017-11-03 ENCOUNTER — Ambulatory Visit (HOSPITAL_COMMUNITY)
Admission: RE | Admit: 2017-11-03 | Discharge: 2017-11-03 | Disposition: A | Discharge status: Home / Self Care | Payer: Medicare Other | Source: Ambulatory Visit | Attending: Family Medicine | Admitting: Family Medicine

## 2017-11-03 ENCOUNTER — Ambulatory Visit: Payer: Medicare Other | Admitting: Family Medicine

## 2017-11-03 ENCOUNTER — Encounter: Payer: Self-pay | Admitting: Family Medicine

## 2017-11-03 VITALS — BP 110/74 | Ht 62.0 in | Wt 153.0 lb

## 2017-11-03 DIAGNOSIS — M5412 Radiculopathy, cervical region: Secondary | ICD-10-CM | POA: Diagnosis not present

## 2017-11-03 NOTE — Progress Notes (Signed)
   Subjective:    Patient ID: [name], female    DOB: 03-09-1946, [age] y.o.   MRN: [id number]  Arm Pain   Pain location: right shoulder and right arm.  pt states she does not know how to explain the way her arm feels. Not really pain or tingling but feels different than it should. It happens one to four times a day. Last about 4 -5 seconds. Started 3 weeks ago.   At times he has an unusual sensation in the upper arm and last for a few minutes and it goes away she denies any weakness or any numbness with it she states he will happen several times per day started a few weeks ago she does do exercise but she denies any injury she states when she is slouching she has had times where she turns her head to the right and she felt what felt like a possible numbness going down her arm to her hand she denies any headaches denies waking up with this  Review of Systems Denies wheezing difficulty breathing fever chills sweats nausea vomiting diarrhea    Objective:   Physical Exam Lungs are clear respiratory rate is normal extremities no edema strength bilateral normal reflexes are normal neck without tenderness       Assessment & Plan:  Possible neuralgia Possible fasciculations May need neurology consultation To keep a diary of this and follow-up in a few weeks If not going away neurology consultation for possible EMG X-rays of the neck ordered Cannot rule out motor neuron disease patient aware of following

## 2017-11-04 ENCOUNTER — Telehealth: Payer: Self-pay | Admitting: Family Medicine

## 2017-11-04 NOTE — Telephone Encounter (Signed)
Patient called and said she is returning a nurse's call.  Please call her back at [phone].

## 2017-11-14 NOTE — Telephone Encounter (Signed)
Received a fax from [employer] Patient Assistance [employer] stating that pt has been approved to received [place]la free of charge from 11/10/2017 through 10/03/2018.   Called pt to update. Gave her the number to call to schedule shipment. Patient voices understanding and denies any questions at this time.  Will send document to scan center.  Vincente Asbridge, [place], CPhT 12:11 PM

## 2017-11-17 ENCOUNTER — Other Ambulatory Visit: Payer: Self-pay

## 2017-11-17 ENCOUNTER — Ambulatory Visit: Payer: Medicare Other | Admitting: Family Medicine

## 2017-11-17 ENCOUNTER — Encounter: Payer: Self-pay | Admitting: Family Medicine

## 2017-11-17 VITALS — BP 120/74 | Ht 62.0 in | Wt 154.0 lb

## 2017-11-17 DIAGNOSIS — I6529 Occlusion and stenosis of unspecified carotid artery: Secondary | ICD-10-CM

## 2017-11-17 DIAGNOSIS — M5412 Radiculopathy, cervical region: Secondary | ICD-10-CM | POA: Diagnosis not present

## 2017-11-17 NOTE — Progress Notes (Signed)
   Subjective:    Patient ID: [name], female    DOB: August 28, 1946, 72 y.o.   MRN: [id number]  HPI Patient is here today to follow up on cervical neuralgia. Patient states she thinks it is much better. States not having any significant numbness or tingling down the right arm no pain or discomfort in the arm no weakness.  Denies any severe neck pain she does have a history of psoriasis as well as psoriatic arthritis Her x-ray of her neck showed spondylosis but also showed spurring with narrowing of the disc as well as a carotid calcification  Review of Systems Negative for headaches positive for cervical impingement negative for shortness of breath chest tightness nausea vomiting diarrhea fever chills sweats negative for rash negative for blurred vision    Objective:   Physical Exam Head atraumatic ears are normal neck no masses lungs clear respiratory rate normal heart regular no murmurs extremities no edema with skin warm and dry psoriasis noted neurologic grossly normal normal tone normal strength       Assessment & Plan:  Cervical impingement with spondylotic changes patient currently does not want to do anything about this but she does relate that if he gets worse she is open to seeing a specialist currently right now no weakness with it no insert mandible pain we will monitor  Carotid calcifications carotid ultrasound ordered

## 2017-11-29 NOTE — Progress Notes (Signed)
Office Visit Note  Patient: [name]             Date of Birth: July 12, 1946           MRN: [id number]             PCP: Babs SciaraLuking, Scott A, MD Referring: Babs SciaraLuking, Scott A, MD Visit Date: 12/13/2017 Occupation: @GUAROCC @    Subjective:  Bilateral knee pain   History of Present Illness: [name] is a 72 y.o. female with history of psoriatic arthritis.  She states she has been taking it has a 30 mg daily.  She denies any side effects.  She states her psoriasis has cleared very well.  She has one small patch of psoriasis on her left elbow.  Not been using any topical creams.  She states that her bilateral knees been causing increased discomfort and weakness.  She has noticed increased stiffness in her right knee.  She has been doing line dancing classes and exercising more regularly.  Denies any swelling.  She denies any other joint pain.  She states she continues to have prominence in her right third PIP joint.  She denies any pain or swelling.  She denies any SI joint pain, Achilles tendinitis, or plantar fasciitis.  He states she experienced an episode of neck stiffness and numbness is rating down her right arm which has resolved.  She had an x-ray performed which showed osteoarthritis in her neck.  Patient states she continues to take vitamin D 2000 units daily.  Is also been taking calcium supplements.   Activities of Daily Living:  Patient reports morning stiffness for 2-3 minutes.   Patient Denies nocturnal pain.  Difficulty dressing/grooming: Denies Difficulty climbing stairs: Denies Difficulty getting out of chair: Denies Difficulty using hands for taps, buttons, cutlery, and/or writing: Reports   Review of Systems  Constitutional: Negative for fatigue and weakness.  HENT: Negative for mouth sores, mouth dryness and nose dryness.   Eyes: Negative for dryness.  Respiratory: Negative for cough and shortness of breath.   Cardiovascular: Negative for chest pain,  palpitations, hypertension and swelling in legs/feet.  Gastrointestinal: Negative for blood in stool, constipation, diarrhea and nausea.  Endocrine: Negative for increased urination.  Genitourinary: Negative for pelvic pain.  Musculoskeletal: Positive for joint swelling and morning stiffness. Negative for arthralgias, joint pain, myalgias, muscle weakness, muscle tenderness and myalgias.  Skin: Negative for rash, hair loss, redness and sensitivity to sunlight.  Allergic/Immunologic: Negative for susceptible to infections.  Neurological: Negative for dizziness, numbness and headaches.  Hematological: Negative for bruising/bleeding tendency.  Psychiatric/Behavioral: Negative for depressed mood, confusion and sleep disturbance.    PMFS History:  Patient Active Problem List   Diagnosis Date Noted  . History of hyperlipidemia 02/09/2017  . Other psoriasis 12/30/2016  . High risk medications (not anticoagulants) long-term use 12/30/2016  . Psoriatic arthritis (HCC) 12/27/2014  . Vitamin D deficiency 08/06/2013  . Osteopenia 08/06/2013  . Insomnia 02/22/2013  . Hyperlipidemia 01/09/2013  . Anemia 09/12/2012  . Toe cyanosis 08/08/2012  . Osteoarthritis of shoulder 09/15/2010    Past Medical History:  Diagnosis Date  . Hx of colonic polyps   . Hypercholesterolemia   . Psoriatic arthritis (HCC)     Family History  Problem Relation Age of Onset  . Anesthesia problems Mother   . Cancer Mother   . Hypertension Mother   . Cancer Father   . Diabetes Father   . Deep vein thrombosis Father   . Hypertension  Father   . Arthritis Unknown        FH  . Diabetes Unknown        FH  . Cancer Unknown        FH  . Hypertension Sister   . Hypertension Brother   . Heart disease Brother   . Migraines Sister   . Migraines Sister   . Hypotension Neg Hx   . Malignant hyperthermia Neg Hx   . Pseudochol deficiency Neg Hx    Past Surgical History:  Procedure Laterality Date  . ABDOMINAL  HYSTERECTOMY    . COLONOSCOPY  08/25/2011   Procedure: COLONOSCOPY;  Surgeon: Malissa Hippo, MD;  Location: [facility];  Service: Endoscopy;  Laterality: N/A;  12:00  . left knee stable out  1966  . left knee staple in  1962  . VESICOVAGINAL FISTULA CLOSURE W/ TAH  1986   Social History   Social History Narrative  . Not on file     Objective:  Vital Signs: BP 120/67 (BP Location: Left Arm, Patient Position: Sitting, Cuff Size: Normal)   Pulse (!) 53   Resp 16   Ht 5\' 2"  (1.575 m)   Wt 154 lb (69.9 kg)   BMI 28.17 kg/m    Physical Exam  Constitutional: She is oriented to person, place, and time. She appears well-developed and well-nourished.  HENT:  Head: Normocephalic and atraumatic.  Eyes: Conjunctivae and EOM are normal.  Neck: Normal range of motion.  Cardiovascular: Normal rate, regular rhythm, normal heart sounds and intact distal pulses.  Pulmonary/Chest: Effort normal and breath sounds normal.  Abdominal: Soft. Bowel sounds are normal.  Lymphadenopathy:    She has no cervical adenopathy.  Neurological: She is alert and oriented to person, place, and time.  Skin: Skin is warm and dry. Capillary refill takes less than 2 seconds.  1 small patch of psoriasis on extensor surface of left elbow  Psychiatric: She has a normal mood and affect. Her behavior is normal.  Nursing note and vitals reviewed.    Musculoskeletal Exam: C-spine, thoracic spine, lumbar spine good range of motion.  No midline spinal tenderness.  No SI joint tenderness.  Shoulder joints, elbow joints, wrist joints, MCPs, PIPs, and DIPs.  She has synovial thickening of her right third PIP joint.  Hip joints, knee joints, ankle joints, MTPs, PIPs, DIPs good range of motion with no synovitis.  No warmth or effusion of bilateral knees.  No knee crepitus.  No tenderness of trochanteric bursa bilaterally.  No tenderness of plantar fascia or Achilles tendon bilaterally.  CDAI Exam: No CDAI exam completed.      Investigation: No additional findings. CBC Latest Ref Rng & Units 10/13/2017 07/14/2017 04/08/2017  WBC 3.8 - 10.8 Thousand/uL 5.8 5.3 6.4  Hemoglobin 11.7 - 15.5 g/dL 16.1 11.1(L) 11.7  Hematocrit 35.0 - 45.0 % 36.7 33.8(L) 35.7  Platelets 140 - 400 Thousand/uL 229 247 259   CMP Latest Ref Rng & Units 10/13/2017 07/14/2017 04/08/2017  Glucose 65 - 99 mg/dL 82 81 71  BUN 7 - 25 mg/dL 14 12 11   Creatinine 0.60 - 0.93 mg/dL 0.96 0.45 4.09  Sodium 135 - 146 mmol/L 140 140 139  Potassium 3.5 - 5.3 mmol/L 4.1 4.1 4.1  Chloride 98 - 110 mmol/L 106 105 105  CO2 20 - 32 mmol/L 27 27 26   Calcium 8.6 - 10.4 mg/dL 9.8 9.6 9.3  Total Protein 6.1 - 8.1 g/dL 6.8 6.9 6.9  Total Bilirubin 0.2 -  1.2 mg/dL 0.6 0.5 0.4  Alkaline Phos 33 - 130 U/L - - 105  AST 10 - 35 U/L 23 37(H) 19  ALT 6 - 29 U/L 15 37(H) 12    Imaging: No results found.  Speciality Comments: No specialty comments available.    Procedures:  No procedures performed Allergies: Etodolac   Assessment / Plan:     Visit Diagnoses: Psoriatic arthritis (HCC): No synovitis or dactylitis noted on exam.  She is synovial thickening of her right third PIP joint but no synovitis or tenderness was noted.  No SI joint tenderness, no Achilles tendinitis, or plantar fasciitis.  One small patch of psoriasis on the extensor surface of her left elbow.  She has been having occasional discomfort in her bilateral knees.  No warmth or effusion on exam no knee crepitus.  She has increased her exercise regimen lately.  She is given a handout of the exercises that she can perform at home.  She will continue on Otezla 30 mg daily.  Other psoriasis: She has 1 small patch of psoriasis on the extensor surface of her left elbow.  She does not use anything topically.  She reports [name] has helped clear her psoriasis.   High risk medications (not anticoagulants) long-term use - Otezla 30 mg.  Her labs are within normal limits on 10/13/2017.  Pain in both  knees: She has no warmth or effusion.  No knee crepitus.  She was given a handout of knee exercises she can perform at home.   Osteopenia of multiple sites - She is on Calcium and vitamin D.  Vitamin D deficiency -She takes vitamin D 2000 units daily.  Other medical conditions are listed as follows:  History of hyperlipidemia  Other insomnia  History of anemia    Orders: No orders of the defined types were placed in this encounter.  No orders of the defined types were placed in this encounter.   Face-to-face time spent with patient was . >50% of time was spent in counseling and coordination of care.  Follow-Up Instructions: Return in about 5 months (around 05/15/2018) for Psoriatic arthritis.   Gearldine Bienenstock, PA-C  Note - This record has been created using Dragon software.  Chart creation errors have been sought, but may not always  have been located. Such creation errors do not reflect on  the standard of medical care.

## 2017-12-05 ENCOUNTER — Other Ambulatory Visit: Payer: Self-pay | Admitting: Family Medicine

## 2017-12-05 DIAGNOSIS — Z1231 Encounter for screening mammogram for malignant neoplasm of breast: Secondary | ICD-10-CM

## 2017-12-13 ENCOUNTER — Ambulatory Visit: Payer: Medicare Other | Admitting: Rheumatology

## 2017-12-13 ENCOUNTER — Ambulatory Visit (INDEPENDENT_AMBULATORY_CARE_PROVIDER_SITE_OTHER): Payer: Medicare Other | Admitting: Physician Assistant

## 2017-12-13 ENCOUNTER — Encounter: Payer: Self-pay | Admitting: Physician Assistant

## 2017-12-13 VITALS — BP 120/67 | HR 53 | Resp 16 | Ht 62.0 in | Wt 154.0 lb

## 2017-12-13 DIAGNOSIS — G4709 Other insomnia: Secondary | ICD-10-CM

## 2017-12-13 DIAGNOSIS — M25561 Pain in right knee: Secondary | ICD-10-CM | POA: Diagnosis not present

## 2017-12-13 DIAGNOSIS — E559 Vitamin D deficiency, unspecified: Secondary | ICD-10-CM

## 2017-12-13 DIAGNOSIS — L408 Other psoriasis: Secondary | ICD-10-CM | POA: Diagnosis not present

## 2017-12-13 DIAGNOSIS — Z862 Personal history of diseases of the blood and blood-forming organs and certain disorders involving the immune mechanism: Secondary | ICD-10-CM | POA: Diagnosis not present

## 2017-12-13 DIAGNOSIS — Z8639 Personal history of other endocrine, nutritional and metabolic disease: Secondary | ICD-10-CM | POA: Diagnosis not present

## 2017-12-13 DIAGNOSIS — M25562 Pain in left knee: Secondary | ICD-10-CM

## 2017-12-13 DIAGNOSIS — L4050 Arthropathic psoriasis, unspecified: Secondary | ICD-10-CM | POA: Diagnosis not present

## 2017-12-13 DIAGNOSIS — Z79899 Other long term (current) drug therapy: Secondary | ICD-10-CM | POA: Diagnosis not present

## 2017-12-13 DIAGNOSIS — M8589 Other specified disorders of bone density and structure, multiple sites: Secondary | ICD-10-CM | POA: Diagnosis not present

## 2017-12-13 NOTE — Patient Instructions (Signed)

## 2017-12-23 ENCOUNTER — Other Ambulatory Visit: Payer: Self-pay | Admitting: Family Medicine

## 2017-12-23 ENCOUNTER — Ambulatory Visit: Payer: Medicare Other

## 2017-12-23 ENCOUNTER — Ambulatory Visit
Admission: RE | Admit: 2017-12-23 | Discharge: 2017-12-23 | Disposition: A | Discharge status: Home / Self Care | Payer: Medicare Other | Source: Ambulatory Visit | Attending: Family Medicine | Admitting: Family Medicine

## 2017-12-23 DIAGNOSIS — Z1231 Encounter for screening mammogram for malignant neoplasm of breast: Secondary | ICD-10-CM

## 2018-02-10 ENCOUNTER — Other Ambulatory Visit: Payer: Self-pay | Admitting: Rheumatology

## 2018-02-10 NOTE — Telephone Encounter (Signed)
Last Visit: 12/13/17 Next Visit: 05/15/18  Okay to refill per Dr. Corliss Skains

## 2018-02-22 ENCOUNTER — Ambulatory Visit: Payer: Medicare Other | Admitting: Family Medicine

## 2018-02-22 ENCOUNTER — Encounter: Payer: Self-pay | Admitting: Family Medicine

## 2018-02-22 VITALS — BP 130/78 | Temp 97.5°F | Ht 62.0 in | Wt 151.2 lb

## 2018-02-22 DIAGNOSIS — M79674 Pain in right toe(s): Secondary | ICD-10-CM | POA: Diagnosis not present

## 2018-02-22 NOTE — Progress Notes (Signed)
   Subjective:    Patient ID: [name], female    DOB: 12-02-1945, 72 y.o.   MRN: [id number]  HPI Pt here today due to 2nd toe on right foot is swollen. Has been swollen for about 3 days. Pt did hit foot about a week ago, pt states it was an insignificant hit. Had not tried any treatments. Not painful or tender  Patient arrives with a swollen toe.  On further consideration states this is the toe she struck against an object at home.  Has developed swelling.  Was concerned that it may be something serious. Review of Systems No headache, no major weight loss or weight gain, no chest pain no back pain abdominal pain no change in bowel habits complete ROS otherwise negative     Objective:   Physical Exam  Alert vitals stable, NAD. Blood pressure good on repeat. HEENT normal. Lungs clear. Heart regular rate and rhythm.   Second toe distinctly swelling distinct dorsal tenderness at the base      Assessment & Plan:  Impression post contusion/sprain inflammation with mild edema.  No evidence of true infection.  Anti-inflammatory medicine as needed local measures discussed multiple questions answered  Greater than 50% of this 15 minute face to face visit was spent in counseling and discussion and coordination of care regarding the above diagnosis/diagnosies

## 2018-03-03 ENCOUNTER — Telehealth: Payer: Self-pay | Admitting: Rheumatology

## 2018-03-03 NOTE — Telephone Encounter (Signed)
Patient called stating that she stumped her toe about 2 weeks.  She has been taking Ibuprofen, but it is still swollen.  I offered to make her an appointment, but she was not sure if she needed to make one or just talk to someone about it.  CB#[phone].  Thank you.

## 2018-03-06 ENCOUNTER — Encounter: Payer: Self-pay | Admitting: Physician Assistant

## 2018-03-06 ENCOUNTER — Ambulatory Visit: Payer: Medicare Other | Admitting: Physician Assistant

## 2018-03-06 VITALS — BP 127/70 | HR 55 | Resp 14 | Ht 62.0 in | Wt 151.0 lb

## 2018-03-06 DIAGNOSIS — Z862 Personal history of diseases of the blood and blood-forming organs and certain disorders involving the immune mechanism: Secondary | ICD-10-CM | POA: Diagnosis not present

## 2018-03-06 DIAGNOSIS — Z79899 Other long term (current) drug therapy: Secondary | ICD-10-CM | POA: Diagnosis not present

## 2018-03-06 DIAGNOSIS — Z8639 Personal history of other endocrine, nutritional and metabolic disease: Secondary | ICD-10-CM

## 2018-03-06 DIAGNOSIS — L408 Other psoriasis: Secondary | ICD-10-CM

## 2018-03-06 DIAGNOSIS — G4709 Other insomnia: Secondary | ICD-10-CM

## 2018-03-06 DIAGNOSIS — L4050 Arthropathic psoriasis, unspecified: Secondary | ICD-10-CM | POA: Diagnosis not present

## 2018-03-06 DIAGNOSIS — E559 Vitamin D deficiency, unspecified: Secondary | ICD-10-CM

## 2018-03-06 DIAGNOSIS — M8589 Other specified disorders of bone density and structure, multiple sites: Secondary | ICD-10-CM | POA: Diagnosis not present

## 2018-03-06 MED ORDER — PREDNISONE 5 MG PO TABS: ORAL_TABLET | ORAL | 0 refills | Status: DC

## 2018-03-06 NOTE — Progress Notes (Signed)
Office Visit Note  Patient: [name]             Date of Birth: 1946/07/28           MRN: [id number]             PCP: Babs Sciara, MD Referring: Babs Sciara, MD Visit Date: 03/06/2018 Occupation: @GUAROCC @    Subjective:   Pain of the Right 2nd Toe   History of Present Illness: [name] is a 72 y.o. female with history of psoriatic arthritis.  Patient takes Otezla 30 mg twice daily.  She states that up to 3-4 times a week she misses one of her daily doses.  She states that she will often miss her morning dose.  She states that 2 weeks ago she hit her right 2nd toe but she states it was very minor and she does not even recall what she hit her toe on.  She states she continues to have swelling and discoloration of this toe.  She states the pain is mild and is tender to touch.  She denies any redness or warmth.  She reports limited ROM.  She states she is able to bear full weight on her right foot.  She denies a history of gout.  She reports that she saw her PCP who did not x-ray it.  Recommended NSAID use.  She has been taking Aleve for pain relief.  She denies any other joint pain or joint swelling.  She states that her psoriasis is started flaring on her bilateral lower legs left elbow and right arm.  She states she is been using a topical oil.  She denies any plantar fasciitis or Achilles tendinitis.  She denies any SI joint pain.  Activities of Daily Living:  Patient reports morning stiffness for 0 minutes.   Patient Denies nocturnal pain.  Difficulty dressing/grooming: Denies Difficulty climbing stairs: Denies Difficulty getting out of chair: Denies Difficulty using hands for taps, buttons, cutlery, and/or writing: Denies   Review of Systems  Constitutional: Negative for fatigue.  HENT: Negative for mouth sores, mouth dryness and nose dryness.   Eyes: Negative for pain, visual disturbance and dryness.  Respiratory: Negative for cough, hemoptysis,  shortness of breath and difficulty breathing.   Cardiovascular: Negative for chest pain, palpitations, hypertension and swelling in legs/feet.  Gastrointestinal: Negative for abdominal pain, blood in stool, constipation and diarrhea.  Endocrine: Negative for increased urination.  Genitourinary: Negative for painful urination and pelvic pain.  Musculoskeletal: Positive for joint swelling. Negative for arthralgias, joint pain, myalgias, muscle weakness, morning stiffness, muscle tenderness and myalgias.  Skin: Negative for color change, pallor, rash, hair loss, nodules/bumps, skin tightness, ulcers and sensitivity to sunlight.  Allergic/Immunologic: Negative for susceptible to infections.  Neurological: Negative for dizziness, light-headedness, numbness, headaches, memory loss and weakness.  Hematological: Negative for bruising/bleeding tendency and swollen glands.  Psychiatric/Behavioral: Negative for depressed mood, confusion and sleep disturbance. The patient is not nervous/anxious.     PMFS History:  Patient Active Problem List   Diagnosis Date Noted  . History of hyperlipidemia 02/09/2017  . Other psoriasis 12/30/2016  . High risk medications (not anticoagulants) long-term use 12/30/2016  . Psoriatic arthritis (HCC) 12/27/2014  . Vitamin D deficiency 08/06/2013  . Osteopenia 08/06/2013  . Insomnia 02/22/2013  . Hyperlipidemia 01/09/2013  . Anemia 09/12/2012  . Toe cyanosis 08/08/2012  . Osteoarthritis of shoulder 09/15/2010    Past Medical History:  Diagnosis Date  . Hx of colonic  polyps   . Hypercholesterolemia   . Psoriatic arthritis (HCC)     Family History  Problem Relation Age of Onset  . Anesthesia problems Mother   . Cancer Mother   . Hypertension Mother   . Cancer Father   . Diabetes Father   . Deep vein thrombosis Father   . Hypertension Father   . Arthritis Unknown        FH  . Diabetes Unknown        FH  . Cancer Unknown        FH  . Hypertension Sister    . Hypertension Brother   . Heart disease Brother   . Migraines Sister   . Migraines Sister   . Hypotension Neg Hx   . Malignant hyperthermia Neg Hx   . Pseudochol deficiency Neg Hx    Past Surgical History:  Procedure Laterality Date  . ABDOMINAL HYSTERECTOMY    . COLONOSCOPY  08/25/2011   Procedure: COLONOSCOPY;  Surgeon: Malissa HippoNajeeb U Rehman, MD;  Location: [facility];  Service: Endoscopy;  Laterality: N/A;  12:00  . left knee stable out  1966  . left knee staple in  1962  . VESICOVAGINAL FISTULA CLOSURE W/ TAH  1986   Social History   Social History Narrative  . Not on file     Objective: Vital Signs: BP 127/70 (BP Location: Left Arm, Patient Position: Sitting, Cuff Size: Small)   Pulse (!) 55   Resp 14   Ht 5\' 2"  (1.575 m)   Wt 151 lb (68.5 kg)   BMI 27.62 kg/m    Physical Exam  Constitutional: She is oriented to person, place, and time. She appears well-developed and well-nourished.  HENT:  Head: Normocephalic and atraumatic.  Eyes: Conjunctivae and EOM are normal.  Neck: Normal range of motion.  Cardiovascular: Normal rate, regular rhythm, normal heart sounds and intact distal pulses.  Pulmonary/Chest: Effort normal and breath sounds normal.  Abdominal: Soft. Bowel sounds are normal.  Lymphadenopathy:    She has no cervical adenopathy.  Neurological: She is alert and oriented to person, place, and time.  Skin: Skin is warm and dry. Capillary refill takes less than 2 seconds.  Psychiatric: She has a normal mood and affect. Her behavior is normal.  Nursing note and vitals reviewed.    Musculoskeletal Exam: C-spine, thoracic spine, lumbar spine good range of motion.  No midline spinal tenderness.  No SI joint tenderness.  Shoulder joints, elbow joints, wrist joints, MCPs, PIPs, DIPs good range of motion with no synovitis.  She has PIP and DIP synovial thickening.  Hip joints, knee joints, ankle joints, good range of motion with no synovitis.  No warmth or effusion  of bilateral knee joints.  No tenderness of trochanteric bursa.  She has dactylitis of her right second toe.  No tenderness of MTP joints.  CDAI Exam: No CDAI exam completed.    Investigation: No additional findings.   Imaging: No results found.  Speciality Comments: No specialty comments available.    Procedures:  No procedures performed Allergies: Etodolac   Assessment / Plan:     Visit Diagnoses: Psoriatic arthritis (HCC): She has dactylitis of right 2nd toe on exam.  She has limited ROM with some tenderness and discoloration. She hit her toe on something about 2 weeks ago but it was a very minor injury.  She saw her PCP who recommended NSAIDs.  She has been taking Aleve.  She is able to bear full weight.  She does not have a history of gout.  Her psoriasis has also started to flare.  She has active psoriasis on the extensor surface of her left elbow as well as anterior surface of bilateral shins.  She had no SI join tenderness. No Achilles tendinitis or plantar fasciitis.  She has no other synovitis or dactylitis on exam.  She is prescribed Otezla 30 mg twice daily.  She misses her morning dose 3-4 times per week.  We stressed the importance of compliance with taking her medication as prescribed.  She is going to try to take her medication twice daily. She does not want to add on MTX or switch medications at this time. She was given a pillbox today in the office which will hopefully help with compliance.  A prescription for prednisone 20 mg tapering by 5 mg every 4 days was sent to the pharmacy. Potential side effects were discussed. She was advised to notify us if she develops increased joint pain or joint swelling in any other joints.  We will see her back in August 2019.   Other psoriasis: Her psoriasis has started to flare recently.  She is has active psoriasis on extensor surface of her left elbow as well as anterior surface of bilateral shins.  She applies a topical oil but is  unsure the name of it at this time.  High risk medications (not anticoagulants) long-term use - Otezla 30 mg BID   Osteopenia of multiple sites: She takes calcium and vitamin D supplements.  Vitamin D deficiency: She takes vitamin D 2000 units every 3 days.   Other medical conditions are listed as follows:   History of hyperlipidemia  History of anemia  Other insomnia    Orders: No orders of the defined types were placed in this encounter.  Meds ordered this encounter  Medications  . predniSONE (DELTASONE) 5 MG tablet    Sig: Take 4 tablets by mouth for 4 days, 3 tablets by mouth for 4 days, 2 tablets for 4 days, 1 tablet for 4 days.    Dispense:  40 tablet    Refill:  0    Face-to-face time spent with patient was 30 minutes. >50% of time was spent in counseling and coordination of care.  Follow-Up Instructions: Return for Psoriatic arthritis.   Gearldine Bienenstock, PA-C  Note - This record has been created using Dragon software.  Chart creation errors have been sought, but may not always  have been located. Such creation errors do not reflect on  the standard of medical care.

## 2018-03-06 NOTE — Telephone Encounter (Signed)
Patient states she is taking Aleve and is still having swelling in her toe. Patient has been scheduled for 03/06/18 at 1:30 pm.

## 2018-03-21 ENCOUNTER — Telehealth: Payer: Self-pay | Admitting: Rheumatology

## 2018-03-21 NOTE — Telephone Encounter (Signed)
Patient called stating she has finished her Prednisone that Dr. Corliss Skainseveshwar prescribed for her swollen toe.  Patient states she is not sure it helped and doesn't know what Dr. Corliss Skainseveshwar would recommend.

## 2018-03-22 NOTE — Telephone Encounter (Signed)
attempted to contact the patient and left message for patient to call the office.  

## 2018-04-20 ENCOUNTER — Telehealth: Payer: Self-pay | Admitting: Rheumatology

## 2018-04-20 NOTE — Telephone Encounter (Signed)
Left message to advise patient she does not have any lab work due at this time but has an appointment due 05/15/18.

## 2018-04-20 NOTE — Telephone Encounter (Signed)
Patient left a voicemail requesting a return call to let her know when her labwork is due.   °

## 2018-05-01 NOTE — Progress Notes (Signed)
Office Visit Note  Patient: [name]             Date of Birth: 12-Feb-1946           MRN: [id number]             PCP: Babs Sciara, MD Referring: Babs Sciara, MD Visit Date: 05/15/2018 Occupation: @GUAROCC @  Subjective:  Right second toe discoloration   History of Present Illness: [name] is a 72 y.o. female with history of psoriatic arthritis.  Patient is taking Otezla 30 mg po BID. She reports she has been compliant with [name].  She denies any joint pain or joint swelling at this time.  She denies any Achilles tendinitis or plantar fasciitis.  She denies any SI joint pain.  She states that her right second toe continues to be discolored after injuring it back in June.  She denies any pain or swelling in this toe at that time.  She reports that when she was put on prednisone she did not notice any benefit.  She states that her psoriasis continues to improve.  She is a small patch on the left elbow.  She states that she has been working out 3-4 times per week at the gym.    Activities of Daily Living:  Patient reports morning stiffness for 2 minutes.   Patient Denies nocturnal pain.  Difficulty dressing/grooming: Denies Difficulty climbing stairs: Denies Difficulty getting out of chair: Denies Difficulty using hands for taps, buttons, cutlery, and/or writing: Denies  Review of Systems  Constitutional: Negative for fatigue.  HENT: Negative for mouth sores, mouth dryness and nose dryness.   Eyes: Negative for pain, visual disturbance and dryness.  Respiratory: Negative for cough, hemoptysis, shortness of breath and difficulty breathing.   Cardiovascular: Negative for chest pain, palpitations, hypertension and swelling in legs/feet.  Gastrointestinal: Negative for blood in stool, constipation and diarrhea.  Endocrine: Negative for increased urination.  Genitourinary: Negative for difficulty urinating and painful urination.  Musculoskeletal: Positive  for morning stiffness. Negative for arthralgias, joint pain, joint swelling, myalgias, muscle weakness, muscle tenderness and myalgias.  Skin: Negative for color change, pallor, rash, hair loss, nodules/bumps, skin tightness, ulcers and sensitivity to sunlight.  Allergic/Immunologic: Negative for susceptible to infections.  Neurological: Negative for dizziness, numbness, headaches and weakness.  Hematological: Negative for bruising/bleeding tendency and swollen glands.  Psychiatric/Behavioral: Positive for sleep disturbance. Negative for depressed mood. The patient is not nervous/anxious.     PMFS History:  Patient Active Problem List   Diagnosis Date Noted  . History of hyperlipidemia 02/09/2017  . Other psoriasis 12/30/2016  . High risk medications (not anticoagulants) long-term use 12/30/2016  . Psoriatic arthritis (HCC) 12/27/2014  . Vitamin D deficiency 08/06/2013  . Osteopenia 08/06/2013  . Insomnia 02/22/2013  . Hyperlipidemia 01/09/2013  . Anemia 09/12/2012  . Toe cyanosis 08/08/2012  . Osteoarthritis of shoulder 09/15/2010    Past Medical History:  Diagnosis Date  . Hx of colonic polyps   . Hypercholesterolemia   . Psoriatic arthritis (HCC)     Family History  Problem Relation Age of Onset  . Anesthesia problems Mother   . Cancer Mother   . Hypertension Mother   . Cancer Father   . Diabetes Father   . Deep vein thrombosis Father   . Hypertension Father   . Arthritis Unknown        FH  . Diabetes Unknown        FH  . Cancer Unknown  FH  . Hypertension Sister   . Hypertension Brother   . Heart disease Brother   . Migraines Sister   . Migraines Sister   . Hypotension Neg Hx   . Malignant hyperthermia Neg Hx   . Pseudochol deficiency Neg Hx    Past Surgical History:  Procedure Laterality Date  . ABDOMINAL HYSTERECTOMY    . COLONOSCOPY  08/25/2011   Procedure: COLONOSCOPY;  Surgeon: Malissa HippoNajeeb U Rehman, MD;  Location: [facility];  Service: Endoscopy;   Laterality: N/A;  12:00  . left knee stable out  1966  . left knee staple in  1962  . VESICOVAGINAL FISTULA CLOSURE W/ TAH  1986   Social History   Social History Narrative  . Not on file    Objective: Vital Signs: BP (!) 117/58 (BP Location: Left Arm, Patient Position: Sitting, Cuff Size: Normal)   Pulse (!) 44   Resp 16   Ht 5' 2.5" (1.588 m)   Wt 148 lb 6.4 oz (67.3 kg)   BMI 26.71 kg/m    Physical Exam  Constitutional: She is oriented to person, place, and time. She appears well-developed and well-nourished.  HENT:  Head: Normocephalic and atraumatic.  Eyes: Conjunctivae and EOM are normal.  Neck: Normal range of motion.  Cardiovascular: Normal rate, regular rhythm, normal heart sounds and intact distal pulses.  Pulmonary/Chest: Effort normal and breath sounds normal.  Abdominal: Soft. Bowel sounds are normal.  Lymphadenopathy:    She has no cervical adenopathy.  Neurological: She is alert and oriented to person, place, and time.  Skin: Skin is warm and dry. Capillary refill takes less than 2 seconds.  Psychiatric: She has a normal mood and affect. Her behavior is normal.  Nursing note and vitals reviewed.    Musculoskeletal Exam: C-spine, thoracic spine, lumbar spine good range of motion.  No midline spinal tenderness.  No SI joint tenderness.  Shoulder joints, elbow joints, wrist joints, MCPs, PIPs, DIPs good range of motion with no synovitis.  PIP and DIP synovial thickening consistent with osteoarthritis of bilateral hands.  Hip joints, knee joints, ankle joints, MTPs, PIPs, DIPs good range of motion no synovitis.  No warmth or effusion of bilateral knee joints.  No tenderness of trochanteric bursa bilaterally.  Right second toe is discolored but no dactylitis or tenderness noted.  No tenderness of MTP or PIP joints.  CDAI Exam: CDAI Homunculus Exam:   Joint Counts:  CDAI Tender Joint count: 0 CDAI Swollen Joint count: 0  Global Assessments:  Patient Global  Assessment: 1 Provider Global Assessment: 1  CDAI Calculated Score: 2   Investigation: No additional findings.  Imaging: No results found.  Recent Labs: Lab Results  Component Value Date   WBC 5.8 10/13/2017   HGB 11.9 10/13/2017   PLT 229 10/13/2017   NA 140 10/13/2017   K 4.1 10/13/2017   CL 106 10/13/2017   CO2 27 10/13/2017   GLUCOSE 82 10/13/2017   BUN 14 10/13/2017   CREATININE 0.89 10/13/2017   BILITOT 0.6 10/13/2017   ALKPHOS 105 04/08/2017   AST 23 10/13/2017   ALT 15 10/13/2017   PROT 6.8 10/13/2017   ALBUMIN 4.1 04/08/2017   CALCIUM 9.8 10/13/2017   GFRAA 76 10/13/2017    Speciality Comments: No specialty comments available.  Procedures:  No procedures performed Allergies: Etodolac   Assessment / Plan:     Visit Diagnoses: Psoriatic arthritis (HCC): She has no synovitis or dactylitis on exam.  She has no joint  pain or joint swelling at this time.  Right second toe is discolored but no dactylitis or tenderness on exam. At her last visit she was given a prednisone taper for potential dactylitis but she did not notice any benefit.  She has not had any recent psoriatic arthritis flares.  She has no Achilles tendinitis or plantar fasciitis.  No tenderness of SI joints on exam.  She has been taking Otezla 30 mg by mouth twice daily.  Her compliance has improved.  She will continue on this current treatment regimen. She does not want to change or add any additional medications at this time. She will follow-up in 5 months.  She was advised to notify us if she develops increased joint pain or joint swelling.  Other psoriasis: She has a small patch of psoriasis on the extensor surface of the left elbow.  Her psoriasis continues to improve.  High risk medications (not anticoagulants) long-term use - Otezla 30 mg BID. She is following up with PCP for a yearly physical in a few weeks and will have lab work at that time.   Osteopenia of multiple sites: She takes a calcium  vitamin D supplement on a daily basis.  Vitamin D deficiency: She takes a calcium and vitamin D supplement on a daily basis.   Other medical conditions are listed as follows:   Other insomnia  History of anemia  History of hyperlipidemia   Orders: No orders of the defined types were placed in this encounter.  No orders of the defined types were placed in this encounter.    Follow-Up Instructions: Return in about 6 months (around 11/15/2018) for Psoriatic arthritis.   Gearldine Bienenstock, PA-C   I examined and evaluated the patient with Sherron Ales PA.  She had no synovitis on my examination today.  She has some discoloration in her right second toe without any tenderness on examination.  The plan of care was discussed as noted above.  Pollyann Savoy, MD  Note - This record has been created using Animal nutritionist.  Chart creation errors have been sought, but may not always  have been located. Such creation errors do not reflect on  the standard of medical care.

## 2018-05-02 ENCOUNTER — Telehealth: Payer: Self-pay | Admitting: Family Medicine

## 2018-05-02 NOTE — Telephone Encounter (Signed)
Pt needs lab orders, has PE here 05/30/18 - please call pt when orders are ready

## 2018-05-02 NOTE — Telephone Encounter (Signed)
Last labs ordered by dr scott  05/10/17 lipid, ferritin, tibc

## 2018-05-03 ENCOUNTER — Other Ambulatory Visit: Payer: Self-pay | Admitting: Family Medicine

## 2018-05-03 DIAGNOSIS — E785 Hyperlipidemia, unspecified: Secondary | ICD-10-CM

## 2018-05-03 DIAGNOSIS — D649 Anemia, unspecified: Secondary | ICD-10-CM

## 2018-05-03 DIAGNOSIS — I10 Essential (primary) hypertension: Secondary | ICD-10-CM

## 2018-05-03 DIAGNOSIS — Z0000 Encounter for general adult medical examination without abnormal findings: Secondary | ICD-10-CM

## 2018-05-03 NOTE — Telephone Encounter (Signed)
Lipid, liver, metabolic 7, CBC- anemia, hyperlipidemia, HTN

## 2018-05-03 NOTE — Telephone Encounter (Signed)
Lab orders placed and pt notified.

## 2018-05-09 ENCOUNTER — Other Ambulatory Visit: Payer: Self-pay | Admitting: Rheumatology

## 2018-05-09 NOTE — Telephone Encounter (Signed)
Last visit: 03/06/2018 Next visit: 05/15/2018  Okay to refill per Dr. Corliss Skainseveshwar.

## 2018-05-15 ENCOUNTER — Encounter: Payer: Self-pay | Admitting: Rheumatology

## 2018-05-15 ENCOUNTER — Ambulatory Visit: Payer: Medicare Other | Admitting: Rheumatology

## 2018-05-15 VITALS — BP 117/58 | HR 44 | Resp 16 | Ht 62.5 in | Wt 148.4 lb

## 2018-05-15 DIAGNOSIS — M8589 Other specified disorders of bone density and structure, multiple sites: Secondary | ICD-10-CM

## 2018-05-15 DIAGNOSIS — Z8639 Personal history of other endocrine, nutritional and metabolic disease: Secondary | ICD-10-CM

## 2018-05-15 DIAGNOSIS — E559 Vitamin D deficiency, unspecified: Secondary | ICD-10-CM

## 2018-05-15 DIAGNOSIS — L408 Other psoriasis: Secondary | ICD-10-CM

## 2018-05-15 DIAGNOSIS — L4050 Arthropathic psoriasis, unspecified: Secondary | ICD-10-CM

## 2018-05-15 DIAGNOSIS — G4709 Other insomnia: Secondary | ICD-10-CM

## 2018-05-15 DIAGNOSIS — Z862 Personal history of diseases of the blood and blood-forming organs and certain disorders involving the immune mechanism: Secondary | ICD-10-CM

## 2018-05-15 DIAGNOSIS — Z79899 Other long term (current) drug therapy: Secondary | ICD-10-CM

## 2018-05-18 ENCOUNTER — Telehealth: Payer: Self-pay | Admitting: Family Medicine

## 2018-05-18 ENCOUNTER — Other Ambulatory Visit: Payer: Self-pay | Admitting: Family Medicine

## 2018-05-18 DIAGNOSIS — Z79899 Other long term (current) drug therapy: Secondary | ICD-10-CM

## 2018-05-18 DIAGNOSIS — E559 Vitamin D deficiency, unspecified: Secondary | ICD-10-CM

## 2018-05-18 DIAGNOSIS — E785 Hyperlipidemia, unspecified: Secondary | ICD-10-CM

## 2018-05-18 DIAGNOSIS — D509 Iron deficiency anemia, unspecified: Secondary | ICD-10-CM

## 2018-05-18 NOTE — Telephone Encounter (Signed)
Pt is hoping Dr. Lorin PicketScott will place additional lab orders that her rheumatologist usually orders. She saw her last month but she changed some of her medication and didn't order any lab work. She would like to have Dr. Lorin PicketScott order all her lab work that would be needed for him and for her rheumatologist.  Pt has an appt with Dr. Lorin PicketScott on 8.27.19

## 2018-05-18 NOTE — Telephone Encounter (Signed)
Lipid, liver, metabolic 7, CBC, vitamin D-hyperlipidemia, vitamin D deficiency, microcytic anemia, high risk medication

## 2018-05-18 NOTE — Telephone Encounter (Signed)
Lab orders placed and patient notified.

## 2018-05-24 LAB — LIPID PANEL
CHOLESTEROL TOTAL: 239 mg/dL — AB (ref 100–199)
Chol/HDL Ratio: 3.3 ratio (ref 0.0–4.4)
HDL: 73 mg/dL (ref >39)
LDL Calculated: 152 mg/dL — ABNORMAL HIGH (ref 0–99)
TRIGLYCERIDES: 71 mg/dL (ref 0–149)
VLDL Cholesterol Cal: 14 mg/dL (ref 5–40)

## 2018-05-24 LAB — HEPATIC FUNCTION PANEL
ALK PHOS: 111 IU/L (ref 39–117)
ALT: 12 IU/L (ref 0–32)
AST: 22 IU/L (ref 0–40)
Albumin: 4.6 g/dL (ref 3.5–4.8)
Bilirubin Total: 0.3 mg/dL (ref 0.0–1.2)
Bilirubin, Direct: 0.09 mg/dL (ref 0.00–0.40)
Total Protein: 7.2 g/dL (ref 6.0–8.5)

## 2018-05-24 LAB — CBC WITH DIFFERENTIAL/PLATELET
BASOS ABS: 0 10*3/uL (ref 0.0–0.2)
Basos: 0 %
EOS (ABSOLUTE): 0.1 10*3/uL (ref 0.0–0.4)
Eos: 1 %
HEMATOCRIT: 37.6 % (ref 34.0–46.6)
HEMOGLOBIN: 12.4 g/dL (ref 11.1–15.9)
Immature Grans (Abs): 0 10*3/uL (ref 0.0–0.1)
Immature Granulocytes: 0 %
Lymphocytes Absolute: 3.2 10*3/uL — ABNORMAL HIGH (ref 0.7–3.1)
Lymphs: 50 %
MCH: 24.5 pg — AB (ref 26.6–33.0)
MCHC: 33 g/dL (ref 31.5–35.7)
MCV: 74 fL — ABNORMAL LOW (ref 79–97)
MONOCYTES: 6 %
Monocytes Absolute: 0.4 10*3/uL (ref 0.1–0.9)
Neutrophils Absolute: 2.8 10*3/uL (ref 1.4–7.0)
Neutrophils: 43 %
Platelets: 204 10*3/uL (ref 150–450)
RBC: 5.06 x10E6/uL (ref 3.77–5.28)
RDW: 16.5 % — ABNORMAL HIGH (ref 12.3–15.4)
WBC: 6.5 10*3/uL (ref 3.4–10.8)

## 2018-05-24 LAB — BASIC METABOLIC PANEL
BUN/Creatinine Ratio: 10 — ABNORMAL LOW (ref 12–28)
BUN: 10 mg/dL (ref 8–27)
CO2: 23 mmol/L (ref 20–29)
CREATININE: 0.96 mg/dL (ref 0.57–1.00)
Calcium: 9.7 mg/dL (ref 8.7–10.3)
Chloride: 104 mmol/L (ref 96–106)
GFR, EST AFRICAN AMERICAN: 69 mL/min/{1.73_m2} (ref >59)
GFR, EST NON AFRICAN AMERICAN: 60 mL/min/{1.73_m2} (ref >59)
GLUCOSE: 77 mg/dL (ref 65–99)
Potassium: 4.6 mmol/L (ref 3.5–5.2)
SODIUM: 142 mmol/L (ref 134–144)

## 2018-05-24 LAB — VITAMIN D 25 HYDROXY (VIT D DEFICIENCY, FRACTURES): VIT D 25 HYDROXY: 40.1 ng/mL (ref 30.0–100.0)

## 2018-05-30 ENCOUNTER — Other Ambulatory Visit: Payer: Self-pay | Admitting: Family Medicine

## 2018-05-30 ENCOUNTER — Encounter: Payer: Self-pay | Admitting: Family Medicine

## 2018-05-30 ENCOUNTER — Ambulatory Visit (INDEPENDENT_AMBULATORY_CARE_PROVIDER_SITE_OTHER): Payer: Medicare Other | Admitting: Family Medicine

## 2018-05-30 ENCOUNTER — Encounter (INDEPENDENT_AMBULATORY_CARE_PROVIDER_SITE_OTHER): Payer: Self-pay | Admitting: *Deleted

## 2018-05-30 VITALS — BP 122/82 | Ht 62.0 in | Wt 146.6 lb

## 2018-05-30 DIAGNOSIS — E7849 Other hyperlipidemia: Secondary | ICD-10-CM

## 2018-05-30 DIAGNOSIS — R05 Cough: Secondary | ICD-10-CM

## 2018-05-30 DIAGNOSIS — Z1211 Encounter for screening for malignant neoplasm of colon: Secondary | ICD-10-CM

## 2018-05-30 DIAGNOSIS — E785 Hyperlipidemia, unspecified: Secondary | ICD-10-CM

## 2018-05-30 DIAGNOSIS — Z0000 Encounter for general adult medical examination without abnormal findings: Secondary | ICD-10-CM | POA: Diagnosis not present

## 2018-05-30 DIAGNOSIS — Z780 Asymptomatic menopausal state: Secondary | ICD-10-CM

## 2018-05-30 DIAGNOSIS — L4050 Arthropathic psoriasis, unspecified: Secondary | ICD-10-CM

## 2018-05-30 DIAGNOSIS — R053 Chronic cough: Secondary | ICD-10-CM

## 2018-05-30 MED ORDER — ROSUVASTATIN CALCIUM 5 MG PO TABS: 5.0000 mg | ORAL_TABLET | Freq: Every day | ORAL | 5 refills | Status: DC

## 2018-05-30 NOTE — Patient Instructions (Signed)
Results for orders placed or performed in visit on 05/18/18  Vitamin D (25 hydroxy)  Result Value Ref Range   Vit D, 25-Hydroxy 40.1 30.0 - 100.0 ng/mL  CBC with Differential  Result Value Ref Range   WBC 6.5 3.4 - 10.8 x10E3/uL   RBC 5.06 3.77 - 5.28 x10E6/uL   Hemoglobin 12.4 11.1 - 15.9 g/dL   Hematocrit 16.137.6 09.634.0 - 46.6 %   MCV 74 (L) 79 - 97 fL   MCH 24.5 (L) 26.6 - 33.0 pg   MCHC 33.0 31.5 - 35.7 g/dL   RDW 04.516.5 (H) 40.912.3 - 81.115.4 %   Platelets 204 150 - 450 x10E3/uL   Neutrophils 43 Not Estab. %   Lymphs 50 Not Estab. %   Monocytes 6 Not Estab. %   Eos 1 Not Estab. %   Basos 0 Not Estab. %   Neutrophils Absolute 2.8 1.4 - 7.0 x10E3/uL   Lymphocytes Absolute 3.2 (H) 0.7 - 3.1 x10E3/uL   Monocytes Absolute 0.4 0.1 - 0.9 x10E3/uL   EOS (ABSOLUTE) 0.1 0.0 - 0.4 x10E3/uL   Basophils Absolute 0.0 0.0 - 0.2 x10E3/uL   Immature Granulocytes 0 Not Estab. %   Immature Grans (Abs) 0.0 0.0 - 0.1 x10E3/uL  Basic Metabolic Panel (BMET)  Result Value Ref Range   Glucose 77 65 - 99 mg/dL   BUN 10 8 - 27 mg/dL   Creatinine, Ser 9.140.96 0.57 - 1.00 mg/dL   GFR calc non Af Amer 60 >59 mL/min/1.73   GFR calc Af Amer 69 >59 mL/min/1.73   BUN/Creatinine Ratio 10 (L) 12 - 28   Sodium 142 134 - 144 mmol/L   Potassium 4.6 3.5 - 5.2 mmol/L   Chloride 104 96 - 106 mmol/L   CO2 23 20 - 29 mmol/L   Calcium 9.7 8.7 - 10.3 mg/dL  Hepatic function panel  Result Value Ref Range   Total Protein 7.2 6.0 - 8.5 g/dL   Albumin 4.6 3.5 - 4.8 g/dL   Bilirubin Total 0.3 0.0 - 1.2 mg/dL   Bilirubin, Direct 7.820.09 0.00 - 0.40 mg/dL   Alkaline Phosphatase 111 39 - 117 IU/L   AST 22 0 - 40 IU/L   ALT 12 0 - 32 IU/L  Lipid Profile  Result Value Ref Range   Cholesterol, Total 239 (H) 100 - 199 mg/dL   Triglycerides 71 0 - 149 mg/dL   HDL 73 >95>39 mg/dL   VLDL Cholesterol Cal 14 5 - 40 mg/dL   LDL Calculated 621152 (H) 0 - 99 mg/dL   Chol/HDL Ratio 3.3 0.0 - 4.4 ratio

## 2018-05-30 NOTE — Progress Notes (Signed)
Referral placed. Pt notified and verbalized understanding.

## 2018-05-30 NOTE — Progress Notes (Signed)
Subjective:    Patient ID: [name], female    DOB: Jul 01, 1946, 72 y.o.   MRN: [id number]  HPI  AWV- Annual Wellness Visit  The patient was seen for their annual wellness visit. The patient's past medical history, surgical history, and family history were reviewed. Pertinent vaccines were reviewed ( tetanus, pneumonia, shingles, flu) The patient's medication list was reviewed and updated.  The height and weight were entered.  BMI recorded in electronic record elsewhere  Cognitive screening was completed. Outcome of Mini - Cog: pass   Falls /depression screening electronically recorded within record elsewhere  Current tobacco usage:none (All patients who use tobacco were given written and verbal information on quitting)  Recent listing of emergency department/hospitalizations over the past year were reviewed.  current specialist the patient sees on a regular basis: rheumatologist   Medicare annual wellness visit patient questionnaire was reviewed.  A written screening schedule for the patient for the next 5-10 years was given. Appropriate discussion of followup regarding next visit was discussed. Patient currently uses red rice yeast extract does not do well enough to keep her cholesterol under control she is had a 13% risk of heart disease in the coming months  Patient has chronic cough not causing any shortness of breath it comes and goes been present for months has been exposed to smoke in the past denies any weight loss fever sweats chills or productive nature denies allergy issues denies reflux issues  Patient declines female exam but has has a mammo this year   Review of Systems  Constitutional: Negative for activity change, appetite change and fatigue.  HENT: Negative for congestion and rhinorrhea.   Eyes: Negative for discharge.  Respiratory: Negative for cough, chest tightness and wheezing.   Cardiovascular: Negative for chest pain.  Gastrointestinal:  Negative for abdominal pain, blood in stool and vomiting.  Endocrine: Negative for polyphagia.  Genitourinary: Negative for difficulty urinating and frequency.  Musculoskeletal: Negative for neck pain.  Skin: Negative for color change.  Allergic/Immunologic: Negative for environmental allergies and food allergies.  Neurological: Negative for weakness and headaches.  Psychiatric/Behavioral: Negative for agitation and behavioral problems.       Objective:   Physical Exam  Constitutional: She is oriented to person, place, and time. She appears well-developed and well-nourished.  HENT:  Head: Normocephalic.  Right Ear: External ear normal.  Left Ear: External ear normal.  Eyes: Pupils are equal, round, and reactive to light.  Neck: Normal range of motion. No thyromegaly present.  Cardiovascular: Normal rate, regular rhythm, normal heart sounds and intact distal pulses.  No murmur heard. Pulmonary/Chest: Effort normal and breath sounds normal. No respiratory distress. She has no wheezes.  Abdominal: Soft. Bowel sounds are normal. She exhibits no distension and no mass. There is no tenderness.  Musculoskeletal: Normal range of motion. She exhibits no edema or tenderness.  Lymphadenopathy:    She has no cervical adenopathy.  Neurological: She is alert and oriented to person, place, and time. She exhibits normal muscle tone.  Skin: Skin is warm and dry.  Psychiatric: She has a normal mood and affect. Her behavior is normal.   She is treated by rheumatology for psoriatic arthritis she is currently on Otezla it seems to be helping her       Assessment & Plan:  Adult wellness-complete.wellness physical was conducted today. Importance of diet and exercise were discussed in detail.  In addition to this a discussion regarding safety was also covered. We also reviewed over  immunizations and gave recommendations regarding current immunization needed for age.  In addition to this additional  areas were also touched on including: Preventative health exams needed:  Colonoscopy due for colonoscopy currently we will go ahead with referral patient had adenoma 7 years ago recommended by GI to have this repeated this year  Patient was advised yearly wellness exam  Chronic cough has been exposed to smoke in the past over I recommend chest x-ray currently hold off on pulmonary function testing may have some allergy issues but does not seem to be causing the cough not having any reflux issues  Not causing any shortness of breath currently  Hyperlipidemia at risk for heart disease patient agrees to going with low-dose Crestor also agrees to doing lab work in 8 to 10 weeks and adjust accordingly if she becomes intolerant of the statins she will let us know side effects were discussed

## 2018-06-08 ENCOUNTER — Encounter: Payer: Self-pay | Admitting: Family Medicine

## 2018-06-08 ENCOUNTER — Ambulatory Visit (HOSPITAL_COMMUNITY)
Admission: RE | Admit: 2018-06-08 | Discharge: 2018-06-08 | Disposition: A | Discharge status: Home / Self Care | Payer: Medicare Other | Source: Ambulatory Visit | Attending: Family Medicine | Admitting: Family Medicine

## 2018-06-08 DIAGNOSIS — M8589 Other specified disorders of bone density and structure, multiple sites: Secondary | ICD-10-CM | POA: Insufficient documentation

## 2018-06-08 DIAGNOSIS — Z780 Asymptomatic menopausal state: Secondary | ICD-10-CM | POA: Insufficient documentation

## 2018-07-04 ENCOUNTER — Other Ambulatory Visit (INDEPENDENT_AMBULATORY_CARE_PROVIDER_SITE_OTHER): Payer: Self-pay | Admitting: *Deleted

## 2018-07-04 DIAGNOSIS — Z86010 Personal history of colonic polyps: Secondary | ICD-10-CM | POA: Insufficient documentation

## 2018-07-20 ENCOUNTER — Other Ambulatory Visit: Payer: Self-pay | Admitting: *Deleted

## 2018-07-20 MED ORDER — ROSUVASTATIN CALCIUM 5 MG PO TABS: 5.0000 mg | ORAL_TABLET | Freq: Every day | ORAL | 0 refills | Status: DC

## 2018-07-26 ENCOUNTER — Telehealth: Payer: Self-pay | Admitting: Pharmacy Technician

## 2018-07-26 NOTE — Telephone Encounter (Signed)
Left message for patient that she will be receiving a renewal application for [facility] Support soon and if she has any questions or needs any assistance in completing her portion, to give me a call.  11:00 AM Dorthula Nettles, CPhT

## 2018-07-31 ENCOUNTER — Encounter (INDEPENDENT_AMBULATORY_CARE_PROVIDER_SITE_OTHER): Payer: Self-pay | Admitting: *Deleted

## 2018-08-01 ENCOUNTER — Other Ambulatory Visit: Payer: Self-pay | Admitting: Rheumatology

## 2018-08-01 NOTE — Telephone Encounter (Signed)
Last visit: 05/15/18 Next Visit: 11/14/18  Okay to refill per Dr. Corliss Skains

## 2018-08-03 ENCOUNTER — Telehealth: Payer: Self-pay | Admitting: Family Medicine

## 2018-08-03 NOTE — Telephone Encounter (Signed)
Patient requesting blood work, patient states she checks her cholesterol every 10wks, Monday 08/07/18 will be the 10wk mark. Advise.

## 2018-08-03 NOTE — Telephone Encounter (Signed)
bw orders were already put in for lipid and liver end of august. Pt aware she can go to lab.

## 2018-08-05 LAB — HEPATIC FUNCTION PANEL
ALT: 18 IU/L (ref 0–32)
AST: 30 IU/L (ref 0–40)
Albumin: 4.7 g/dL (ref 3.5–4.8)
Alkaline Phosphatase: 100 IU/L (ref 39–117)
BILIRUBIN, DIRECT: 0.11 mg/dL (ref 0.00–0.40)
Bilirubin Total: 0.4 mg/dL (ref 0.0–1.2)
Total Protein: 7.1 g/dL (ref 6.0–8.5)

## 2018-08-05 LAB — LIPID PANEL
CHOL/HDL RATIO: 2.5 ratio (ref 0.0–4.4)
Cholesterol, Total: 193 mg/dL (ref 100–199)
HDL: 78 mg/dL (ref >39)
LDL CALC: 95 mg/dL (ref 0–99)
Triglycerides: 98 mg/dL (ref 0–149)
VLDL Cholesterol Cal: 20 mg/dL (ref 5–40)

## 2018-08-06 ENCOUNTER — Encounter: Payer: Self-pay | Admitting: Family Medicine

## 2018-08-08 ENCOUNTER — Encounter (INDEPENDENT_AMBULATORY_CARE_PROVIDER_SITE_OTHER): Payer: Self-pay | Admitting: *Deleted

## 2018-08-08 ENCOUNTER — Telehealth: Payer: Self-pay | Admitting: Family Medicine

## 2018-08-08 ENCOUNTER — Telehealth (INDEPENDENT_AMBULATORY_CARE_PROVIDER_SITE_OTHER): Payer: Self-pay | Admitting: *Deleted

## 2018-08-08 MED ORDER — PEG 3350-KCL-NA BICARB-NACL 420 G PO SOLR: 4000.0000 mL | Freq: Once | ORAL | 0 refills | Status: AC

## 2018-08-08 MED ORDER — SUPREP BOWEL PREP KIT 17.5-3.13-1.6 GM/177ML PO SOLN: 1.0000 | Freq: Once | ORAL | 0 refills | Status: AC

## 2018-08-08 NOTE — Telephone Encounter (Signed)
Your lab work overall looks good.  Please do the best he can at eating healthy.  Continue your medications.  Please follow-up in the spring.  Take care-Dr. Lorin Picket

## 2018-08-08 NOTE — Telephone Encounter (Signed)
Pt returned call; reviewed lab results with patient and informed patient that a dictated letter will be sent to her. Pt verbalized understanding.

## 2018-08-08 NOTE — Telephone Encounter (Signed)
I called and left a message asked that pt r/c. 

## 2018-08-08 NOTE — Telephone Encounter (Signed)
Patient needs suprep 

## 2018-08-08 NOTE — Telephone Encounter (Signed)
Patient needs trilyte 

## 2018-08-08 NOTE — Telephone Encounter (Signed)
Patient would like results of recent labs 

## 2018-08-18 NOTE — Telephone Encounter (Signed)
Spoke to patient, she received renewal application and will mail her portion [place]. We will complete part B and fax it in.

## 2018-08-25 ENCOUNTER — Telehealth (INDEPENDENT_AMBULATORY_CARE_PROVIDER_SITE_OTHER): Payer: Self-pay | Admitting: *Deleted

## 2018-08-25 NOTE — Telephone Encounter (Signed)
Referring MD/PCP: scott luking   Procedure: tcs  Reason/Indication:  Hx polyps  Has patient had this procedure before?  Yes, 2012  If so, when, by whom and where?    Is there a family history of colon cancer?  no  Who?  What age when diagnosed?    Is patient diabetic?   no      Does patient have prosthetic heart valve or mechanical valve?  no  Do you have a pacemaker?  no  Has patient ever had endocarditis? no  Has patient had joint replacement within last 12 months?   no  Is patient constipated or do they take laxatives? no  Does patient have a history of alcohol/drug use?  no  Is patient on blood thinner such as Coumadin, Plavix and/or Aspirin? no  Medications: otezla 30 mg bid, rosuvastatin 5 mg daily, vit d 2500 iu every other day, calcium daily, magnesium daily  Allergies: etodolac  Medication Adjustment per Dr Keane Policeehman/Terri Setzer, NP:   Procedure date & time: 09/21/18 at 830

## 2018-08-28 NOTE — Telephone Encounter (Signed)
agree

## 2018-09-21 ENCOUNTER — Ambulatory Visit (HOSPITAL_COMMUNITY)
Admission: RE | Admit: 2018-09-21 | Discharge: 2018-09-21 | Disposition: A | Discharge status: Home / Self Care | Payer: Medicare Other | Attending: Internal Medicine | Admitting: Internal Medicine

## 2018-09-21 ENCOUNTER — Encounter (HOSPITAL_COMMUNITY)
Admission: RE | Disposition: A | Discharge status: Home / Self Care | Payer: Self-pay | Source: Home / Self Care | Attending: Internal Medicine

## 2018-09-21 ENCOUNTER — Other Ambulatory Visit: Payer: Self-pay

## 2018-09-21 ENCOUNTER — Encounter (HOSPITAL_COMMUNITY): Payer: Self-pay | Admitting: *Deleted

## 2018-09-21 DIAGNOSIS — Z79899 Other long term (current) drug therapy: Secondary | ICD-10-CM | POA: Diagnosis not present

## 2018-09-21 DIAGNOSIS — E7800 Pure hypercholesterolemia, unspecified: Secondary | ICD-10-CM | POA: Diagnosis not present

## 2018-09-21 DIAGNOSIS — Z86010 Personal history of colonic polyps: Secondary | ICD-10-CM | POA: Diagnosis not present

## 2018-09-21 DIAGNOSIS — K648 Other hemorrhoids: Secondary | ICD-10-CM | POA: Diagnosis not present

## 2018-09-21 DIAGNOSIS — Z1211 Encounter for screening for malignant neoplasm of colon: Secondary | ICD-10-CM | POA: Diagnosis present

## 2018-09-21 HISTORY — PX: COLONOSCOPY: SHX5424

## 2018-09-21 SURGERY — COLONOSCOPY
Anesthesia: Moderate Sedation

## 2018-09-21 MED ORDER — MEPERIDINE HCL 50 MG/ML IJ SOLN
INTRAMUSCULAR | Status: AC
  Filled 2018-09-21: qty 1

## 2018-09-21 MED ORDER — SODIUM CHLORIDE 0.9 % IV SOLN
INTRAVENOUS | Status: DC
  Administered 2018-09-21: 1000 mL via INTRAVENOUS

## 2018-09-21 MED ORDER — ATROPINE SULFATE 1 MG/ML IJ SOLN
INTRAMUSCULAR | Status: DC | PRN
  Administered 2018-09-21: .5 mg via INTRAVENOUS

## 2018-09-21 MED ORDER — MIDAZOLAM HCL 5 MG/5ML IJ SOLN
INTRAMUSCULAR | Status: DC | PRN
  Administered 2018-09-21: 2 mg via INTRAVENOUS
  Administered 2018-09-21: 1 mg via INTRAVENOUS

## 2018-09-21 MED ORDER — MIDAZOLAM HCL 5 MG/5ML IJ SOLN
INTRAMUSCULAR | Status: AC
  Filled 2018-09-21: qty 10

## 2018-09-21 MED ORDER — ATROPINE SULFATE 1 MG/ML IJ SOLN
INTRAMUSCULAR | Status: AC
  Filled 2018-09-21: qty 1

## 2018-09-21 MED ORDER — STERILE WATER FOR IRRIGATION IR SOLN
Status: DC | PRN
  Administered 2018-09-21: 100 mL

## 2018-09-21 MED ORDER — MEPERIDINE HCL 50 MG/ML IJ SOLN
INTRAMUSCULAR | Status: DC | PRN
  Administered 2018-09-21: 15 mg via INTRAVENOUS
  Administered 2018-09-21: 25 mg via INTRAVENOUS

## 2018-09-21 NOTE — Op Note (Addendum)
[facility] Patient Name: Emily BryantCatherine Fields Procedure Date: 09/21/2018 8:09 AM MRN: [id number] Date of Birth: December 02, 1945 Attending MD: Lionel DecemberNajeeb Rehman , MD CSN: [id number] Age: 89 Admit Type: Outpatient Procedure:                Colonoscopy Indications:              High risk colon cancer surveillance: Personal                            history of colonic polyps Providers:                Lionel DecemberNajeeb Rehman, MD, Edrick Kinsammy Vaught, RN, Dyann Ruddleonya Wilson Referring MD:             Jonna CoupScott A. Gerda DissLuking, MD Medicines:                Meperidine 40 mg IV, Midazolam 3 mg IV, Atropine                            0.5 mg IV Complications:            No immediate complications. Estimated Blood Loss:     Estimated blood loss: none. Procedure:                Pre-Anesthesia Assessment:                           - Prior to the procedure, a History and Physical                            was performed, and patient medications and                            allergies were reviewed. The patient's tolerance of                            previous anesthesia was also reviewed. The risks                            and benefits of the procedure and the sedation                            options and risks were discussed with the patient.                            All questions were answered, and informed consent                            was obtained. Prior Anticoagulants: The patient has                            taken no previous anticoagulant or antiplatelet                            agents. ASA Grade Assessment: II - A patient with  mild systemic disease. After reviewing the risks                            and benefits, the patient was deemed in                            satisfactory condition to undergo the procedure.                           After obtaining informed consent, the colonoscope                            was passed under direct vision. Throughout the        procedure, the patient's blood pressure, pulse, and                            oxygen saturations were monitored continuously. The                            [id number]-[id number] ([id number] scope was introduced through                            the anus and advanced to the the cecum, identified                            by appendiceal orifice and ileocecal valve. The                            colonoscopy was technically difficult and complex                            due to restricted mobility of the colon. Successful                            completion of the procedure was aided by                            withdrawing the scope and replacing with the                            'babyscope'. The patient tolerated the procedure                            well. The quality of the bowel preparation was                            adequate. The ileocecal valve, appendiceal orifice,                            and rectum were photographed. Scope In: 8:29:49 AM Scope Out: 9:06:28 AM Scope Withdrawal Time: 0 hours 10 minutes 14 seconds  Total Procedure Duration: 0 hours 36 minutes 39 seconds  Findings:      The perianal and digital rectal examinations were normal.  The colon (entire examined portion) appeared normal.      Internal hemorrhoids were found during retroflexion. The hemorrhoids       were small. Impression:               - The entire examined colon is normal.                           - Internal hemorrhoids.                           - No specimens collected. Moderate Sedation:      Moderate (conscious) sedation was administered by the endoscopy nurse       and supervised by the endoscopist. The following parameters were       monitored: oxygen saturation, heart rate, blood pressure, CO2       capnography and response to care. Total physician intraservice time was       42 minutes. Recommendation:           - Patient has a contact number available for                             emergencies. The signs and symptoms of potential                            delayed complications were discussed with the                            patient. Return to normal activities tomorrow.                            Written discharge instructions were provided to the                            patient.                           - Resume previous diet today.                           - Continue present medications.                           - No repeat colonoscopy due to current age (72                            years or older) and the absence of advanced                            adenomas. Procedure Code(s):        --- Professional ---                           [id number], Colonoscopy, flexible; diagnostic, including                            collection of specimen(s) by brushing or washing,  when performed (separate procedure)                           [id number], Moderate sedation; each additional 15                            minutes intraservice time                           99153, Moderate sedation; each additional 15                            minutes intraservice time                           G0500, Moderate sedation services provided by the                            same physician or other qualified health care                            professional performing a gastrointestinal                            endoscopic service that sedation supports,                            requiring the presence of an independent trained                            observer to assist in the monitoring of the                            patient's level of consciousness and physiological                            status; initial 15 minutes of intra-service time;                            patient age 72 years or older (additional time may                            be reported with 09811, as appropriate) Diagnosis Code(s):        --- Professional ---                            Z86.010, Personal history of colonic polyps                           K64.8, Other hemorrhoids CPT copyright 2018 American Medical Association. All rights reserved. The codes documented in this report are preliminary and upon coder review may  be revised to meet current compliance requirements. Lionel December, MD Lionel December, MD 09/21/2018 9:15:22 AM This report has been signed electronically. Number of Addenda: 0

## 2018-09-21 NOTE — H&P (Signed)
[name] is an 72 y.o. female.   Chief Complaint: Patient is here for colonoscopy. HPI: Patient is 72 year old Afro-American female who is here for surveillance colonoscopy.  She has history of colonic adenomas.  Last exam was 7 years ago.  She denies abdominal pain change in bowel habits or rectal bleeding. Family history is negative for CRC. She does not take aspirin or other NSAIDs.  Past Medical History:  Diagnosis Date  . Hx of colonic polyps   . Hypercholesterolemia   . Psoriatic arthritis [facility](H[facility])     Past Surgical History:  Procedure Laterality Date  . ABDOMINAL HYSTERECTOMY    . COLONOSCOPY  08/25/2011   Procedure: COLONOSCOPY;  Surgeon: Malissa HippoNajeeb U Kyran Whittier, MD;  Location: [facility];  Service: Endoscopy;  Laterality: N/A;  12:00  . left knee stable out  1966  . left knee staple in  1962  . VESICOVAGINAL FISTULA CLOSURE W/ TAH  1986    Family History  Problem Relation Age of Onset  . Anesthesia problems Mother   . Cancer Mother   . Hypertension Mother   . Cancer Father   . Diabetes Father   . Deep vein thrombosis Father   . Hypertension Father   . Arthritis Other        FH  . Diabetes Other        FH  . Cancer Other        FH  . Hypertension Sister   . Hypertension Brother   . Heart disease Brother   . Migraines Sister   . Migraines Sister   . Hypotension Neg Hx   . Malignant hyperthermia Neg Hx   . Pseudochol deficiency Neg Hx    Social History:  reports that she has never smoked. She has never used smokeless tobacco. She reports that she does not drink alcohol or use drugs.  Allergies:  Allergies  Allergen Reactions  . Etodolac Other (See Comments)    REACTION: chest pain and nausea    Medications Prior to Admission  Medication Sig Dispense Refill  . Cholecalciferol (VITAMIN D3) 50 MCG (2000 UT) TABS Take 2,000 Units by mouth every evening.    Marland Kitchen. OTEZLA 30 MG TABS TAKE 1 TABLET BY MOUTH TWICE DAILY (Patient taking differently: Take 30 mg by  mouth 2 (two) times daily. ) 180 tablet 0  . rosuvastatin (CRESTOR) 5 MG tablet Take 1 tablet (5 mg total) by mouth daily. (Patient taking differently: Take 5 mg by mouth every evening. ) 90 tablet 0  . Calcium Carbonate-Vit D-Min (CALCIUM 1200 PO) Take 1 tablet by mouth 3 (three) times a week.       No results found for this or any previous visit (from the past 48 hour(s)). No results found.  ROS  Blood pressure 119/65, pulse (!) 56, temperature 97.6 F (36.4 C), temperature source Oral, resp. rate 12, height 5\' 2"  (1.575 m), weight 65.8 kg, SpO2 100 %. Physical Exam  Constitutional: She appears well-developed and well-nourished.  HENT:  Mouth/Throat: Oropharynx is clear and moist.  Eyes: Conjunctivae are normal. No scleral icterus.  Neck: No thyromegaly present.  Cardiovascular: Normal rate, regular rhythm and normal heart sounds.  No murmur heard. Respiratory: Effort normal and breath sounds normal.  GI:  Abdomen is symmetrical.  She has a Pfannenstiel and long midline scars.  Abdomen soft and nontender with organomegaly or masses.  Musculoskeletal:        General: No edema.  Lymphadenopathy:    She has  no cervical adenopathy.  Neurological: She is alert.  Skin: Skin is warm and dry.     Assessment/Plan History of colonic adenomas. Surveillance colonoscopy.  Lionel DecemberNajeeb Khiry Pasquariello, MD 09/21/2018, 8:21 AM

## 2018-09-21 NOTE — Discharge Instructions (Signed)
Hemorrhoids Hemorrhoids are swollen veins in and around the rectum or anus. There are two types of hemorrhoids:  Internal hemorrhoids. These occur in the veins that are just inside the rectum. They may poke through to the outside and become irritated and painful.  External hemorrhoids. These occur in the veins that are outside the anus and can be felt as a painful swelling or hard lump near the anus. Most hemorrhoids do not cause serious problems, and they can be managed with home treatments such as diet and lifestyle changes. If home treatments do not help the symptoms, procedures can be done to shrink or remove the hemorrhoids. What are the causes? This condition is caused by increased pressure in the anal area. This pressure may result from various things, including:  Constipation.  Straining to have a bowel movement.  Diarrhea.  Pregnancy.  Obesity.  Sitting for long periods of time.  Heavy lifting or other activity that causes you to strain.  Anal sex.  Riding a bike for a long period of time. What are the signs or symptoms? Symptoms of this condition include:  Pain.  Anal itching or irritation.  Rectal bleeding.  Leakage of stool (feces).  Anal swelling.  One or more lumps around the anus. How is this diagnosed? This condition can often be diagnosed through a visual exam. Other exams or tests may also be done, such as:  An exam that involves feeling the rectal area with a gloved hand (digital rectal exam).  An exam of the anal canal that is done using a small tube (anoscope).  A blood test, if you have lost a significant amount of blood.  A test to look inside the colon using a flexible tube with a camera on the end (sigmoidoscopy or colonoscopy). How is this treated? This condition can usually be treated at home. However, various procedures may be done if dietary changes, lifestyle changes, and other home treatments do not help your symptoms. These  procedures can help make the hemorrhoids smaller or remove them completely. Some of these procedures involve surgery, and others do not. Common procedures include:  Rubber band ligation. Rubber bands are placed at the base of the hemorrhoids to cut off their blood supply.  Sclerotherapy. Medicine is injected into the hemorrhoids to shrink them.  Infrared coagulation. A type of light energy is used to get rid of the hemorrhoids.  Hemorrhoidectomy surgery. The hemorrhoids are surgically removed, and the veins that supply them are tied off.  Stapled hemorrhoidopexy surgery. The surgeon staples the base of the hemorrhoid to the rectal wall. Follow these instructions at home: Eating and drinking   Eat foods that have a lot of fiber in them, such as whole grains, beans, nuts, fruits, and vegetables.  Ask your health care provider about taking products that have added fiber (fiber supplements).  Reduce the amount of fat in your diet. You can do this by eating low-fat dairy products, eating less red meat, and avoiding processed foods.  Drink enough fluid to keep your urine pale yellow. Managing pain and swelling   Take warm sitz baths for 20 minutes, 3-4 times a day to ease pain and discomfort. You may do this in a bathtub or using a portable sitz bath that fits over the toilet.  If directed, apply ice to the affected area. Using ice packs between sitz baths may be helpful. ? Put ice in a plastic bag. ? Place a towel between your skin and the bag. ?  Leave the ice on for 20 minutes, 2-3 times a day. General instructions  Take over-the-counter and prescription medicines only as told by your health care provider.  Use medicated creams or suppositories as told.  Get regular exercise. Ask your health care provider how much and what kind of exercise is best for you. In general, you should do moderate exercise for at least 30 minutes on most days of the week (150 minutes each week). This can  include activities such as walking, biking, or yoga.  Go to the bathroom when you have the urge to have a bowel movement. Do not wait.  Avoid straining to have bowel movements.  Keep the anal area dry and clean. Use wet toilet paper or moist towelettes after a bowel movement.  Do not sit on the toilet for long periods of time. This increases blood pooling and pain.  Keep all follow-up visits as told by your health care provider. This is important. Contact a health care provider if you have:  Increasing pain and swelling that are not controlled by treatment or medicine.  Difficulty having a bowel movement, or you are unable to have a bowel movement.  Pain or inflammation outside the area of the hemorrhoids. Get help right away if you have:  Uncontrolled bleeding from your rectum. Summary  Hemorrhoids are swollen veins in and around the rectum or anus.  Most hemorrhoids can be managed with home treatments such as diet and lifestyle changes.  Taking warm sitz baths can help ease pain and discomfort.  In severe cases, procedures or surgery can be done to shrink or remove the hemorrhoids. This information is not intended to replace advice given to you by your health care provider. Make sure you discuss any questions you have with your health care provider. Document Released: 09/17/2000 Document Revised: 02/09/2018 Document Reviewed: 02/09/2018 Elsevier Interactive Patient Education  2019 [employer] Inc[employer]      Colonoscopy, Adult, Care After This sheet gives you information about how to care for yourself after your procedure. Your health care provider may also give you more specific instructions. If you have problems or questions, contact your health care provider. What can I expect after the procedure? After the procedure, it is common to have:  A small amount of blood in your stool for 24 hours after the procedure.  Some gas.  Mild abdominal cramping or bloating. Follow these  instructions at home: General instructions  For the first 24 hours after the procedure: ? Do not drive or use machinery. ? Do not sign important documents. ? Do not drink alcohol. ? Do your regular daily activities at a slower pace than normal. ? Eat soft, easy-to-digest foods.  Take over-the-counter or prescription medicines only as told by your health care provider. Relieving cramping and bloating   Try walking around when you have cramps or feel bloated.  Apply heat to your abdomen as told by your health care provider. Use a heat source that your health care provider recommends, such as a moist heat pack or a heating pad. ? Place a towel between your skin and the heat source. ? Leave the heat on for 20-30 minutes. ? Remove the heat if your skin turns bright red. This is especially important if you are unable to feel pain, heat, or cold. You may have a greater risk of getting burned. Eating and drinking   Drink enough fluid to keep your urine pale yellow.  Resume your normal diet as instructed by your health  care provider. Avoid heavy or fried foods that are hard to digest.  Avoid drinking alcohol for as long as instructed by your health care provider. Contact a health care provider if:  You have blood in your stool 2-3 days after the procedure. Get help right away if:  You have more than a small spotting of blood in your stool.  You pass large blood clots in your stool.  Your abdomen is swollen.  You have nausea or vomiting.  You have a fever.  You have increasing abdominal pain that is not relieved with medicine. Summary  After the procedure, it is common to have a small amount of blood in your stool. You may also have mild abdominal cramping and bloating.  For the first 24 hours after the procedure, do not drive or use machinery, sign important documents, or drink alcohol.  Contact your health care provider if you have a lot of blood in your stool, nausea or  vomiting, a fever, or increased abdominal pain. This information is not intended to replace advice given to you by your health care provider. Make sure you discuss any questions you have with your health care provider. Document Released: 05/04/2004 Document Revised: 07/13/2017 Document Reviewed: 12/02/2015 Elsevier Interactive Patient Education  2019 [employer] Resume usual medications and diet as before. No driving for 24 hours.

## 2018-09-29 ENCOUNTER — Encounter (HOSPITAL_COMMUNITY): Payer: Self-pay | Admitting: Internal Medicine

## 2018-10-30 NOTE — Telephone Encounter (Signed)
Received fax from [employer], patient's renewal application has been APPROVED. Coverage dates are from 10/30/2018 to 10/04/2019.   Will send documents to [facility].   Phone# [phone] Fax# [phone]

## 2018-10-31 NOTE — Progress Notes (Signed)
Office Visit Note  Patient: [name]             Date of Birth: 09/15/1946           MRN: [id number]             PCP: Babs SciaraLuking, Scott A, MD Referring: Babs SciaraLuking, Scott A, MD Visit Date: 11/14/2018 Occupation: @GUAROCC @  Subjective:  Medication monitoring   History of Present Illness: [name] is a 73 y.o. female with history of psoriatic arthritis and osteopenia.  She is taking Otezla 30 mg BID. She is taking calcium and vitamin D supplement daily.  She denies any recent psoriatic flares.  She denies any joint pain or joint swelling.  She denies any morning stiffness.  She denies any SI joint pain.  She denies any achilles tendonitis or plantar fasciitis.  She denies recent concerns or infections recently.  She states she had a colonoscopy on 09/21/18 that revealed internal hemorrhoids but she is asymptomatic at this time.  She has increased her fluids and fiber intake.     Activities of Daily Living:  Patient reports morning stiffness for 0 none.   Patient Denies nocturnal pain.  Difficulty dressing/grooming: Denies Difficulty climbing stairs: Denies Difficulty getting out of chair: Denies Difficulty using hands for taps, buttons, cutlery, and/or writing: Denies  Review of Systems  Constitutional: Negative for fatigue.  HENT: Negative for mouth sores, mouth dryness and nose dryness.   Eyes: Negative for pain, visual disturbance and dryness.  Respiratory: Negative for cough, hemoptysis, shortness of breath and difficulty breathing.   Cardiovascular: Negative for chest pain, palpitations, hypertension and swelling in legs/feet.  Gastrointestinal: Negative for blood in stool, constipation and diarrhea.  Endocrine: Negative for increased urination.  Genitourinary: Negative for difficulty urinating and painful urination.  Musculoskeletal: Negative for arthralgias, joint pain, joint swelling, myalgias, muscle weakness, morning stiffness, muscle tenderness and  myalgias.  Skin: Negative for color change, pallor, rash, hair loss, nodules/bumps, skin tightness, ulcers and sensitivity to sunlight.  Allergic/Immunologic: Negative for susceptible to infections.  Neurological: Negative for dizziness, numbness, headaches and weakness.  Hematological: Negative for bruising/bleeding tendency and swollen glands.  Psychiatric/Behavioral: Positive for sleep disturbance. Negative for depressed mood. The patient is not nervous/anxious.     PMFS History:  Patient Active Problem List   Diagnosis Date Noted  . History of colonic polyps 07/04/2018  . History of hyperlipidemia 02/09/2017  . Other psoriasis 12/30/2016  . High risk medications (not anticoagulants) long-term use 12/30/2016  . Psoriatic arthritis (HCC) 12/27/2014  . Vitamin D deficiency 08/06/2013  . Osteopenia 08/06/2013  . Insomnia 02/22/2013  . Hyperlipidemia 01/09/2013  . Anemia 09/12/2012  . Toe cyanosis 08/08/2012  . Osteoarthritis of shoulder 09/15/2010    Past Medical History:  Diagnosis Date  . Hx of colonic polyps   . Hypercholesterolemia   . Psoriatic arthritis (HCC)     Family History  Problem Relation Age of Onset  . Anesthesia problems Mother   . Cancer Mother   . Hypertension Mother   . Cancer Father   . Diabetes Father   . Deep vein thrombosis Father   . Hypertension Father   . Arthritis Other        FH  . Diabetes Other        FH  . Cancer Other        FH  . Hypertension Sister   . Hypertension Brother   . Heart disease Brother   . Migraines Sister   .  Migraines Sister   . Hypotension Neg Hx   . Malignant hyperthermia Neg Hx   . Pseudochol deficiency Neg Hx    Past Surgical History:  Procedure Laterality Date  . ABDOMINAL HYSTERECTOMY    . COLONOSCOPY  08/25/2011   Procedure: COLONOSCOPY;  Surgeon: Malissa Hippo, MD;  Location: [facility];  Service: Endoscopy;  Laterality: N/A;  12:00  . COLONOSCOPY N/A 09/21/2018   Procedure: COLONOSCOPY;   Surgeon: Malissa Hippo, MD;  Location: [facility];  Service: Endoscopy;  Laterality: N/A;  830  . left knee stable out  1966  . left knee staple in  1962  . VESICOVAGINAL FISTULA CLOSURE W/ TAH  1986   Social History   Social History Narrative  . Not on file   Immunization History  Administered Date(s) Administered  . Influenza-Unspecified 07/13/2016, 08/02/2018  . Pneumococcal Conjugate-13 12/27/2014  . Pneumococcal Polysaccharide-23 03/30/2016  . Pneumococcal-Unspecified 11/06/2009     Objective: Vital Signs: BP (!) 103/56 (BP Location: Left Arm, Patient Position: Sitting, Cuff Size: Normal)   Pulse (!) 53   Resp 16   Ht 5\' 2"  (1.575 m)   Wt 148 lb 3.2 oz (67.2 kg)   BMI 27.11 kg/m    Physical Exam Vitals signs and nursing note reviewed.  Constitutional:      Appearance: She is well-developed.  HENT:     Head: Normocephalic and atraumatic.  Eyes:     Conjunctiva/sclera: Conjunctivae normal.  Neck:     Musculoskeletal: Normal range of motion.  Cardiovascular:     Rate and Rhythm: Normal rate and regular rhythm.     Heart sounds: Normal heart sounds.  Pulmonary:     Effort: Pulmonary effort is normal.     Breath sounds: Normal breath sounds.  Abdominal:     General: Bowel sounds are normal.     Palpations: Abdomen is soft.  Lymphadenopathy:     Cervical: No cervical adenopathy.  Skin:    General: Skin is warm and dry.     Capillary Refill: Capillary refill takes less than 2 seconds.     Comments: Nail pitting noted.  No psoriasis patches noted.  Hyperpigmentation on forearms noted from previous patches.   Neurological:     Mental Status: She is alert and oriented to person, place, and time.  Psychiatric:        Behavior: Behavior normal.      Musculoskeletal Exam:C-spine, thoracic spine, and lumbar spine good ROM.  No midline spinal tenderness.  No SI joint tenderness.  Shoulder joints, elbow joints, wrist joints, MCPs, PIPs, and DIPs good ROM with no  synovitis. Complete fist formation bilaterally. PIP and DIP synovial thickening consistent with osteoarthritis.  Right hip limited ROM.  Left hip good ROM. Knee joints, ankle joints, MTPs, PIPs, and DIPs good ROM with no synovitis.  No warmth or effusion of knee joints.  No tenderness or swelling of ankle joints.  No achilles tendonitis or plantar fasciitis.   CDAI Exam: CDAI Score: Not documented Patient Global Assessment: Not documented; Provider Global Assessment: Not documented Swollen: Not documented; Tender: Not documented Joint Exam   Not documented   There is currently no information documented on the homunculus. Go to the Rheumatology activity and complete the homunculus joint exam.  Investigation: No additional findings.  Imaging: No results found.  Recent Labs: Lab Results  Component Value Date   WBC 6.5 05/23/2018   HGB 12.4 05/23/2018   PLT 204 05/23/2018   NA 142  05/23/2018   K 4.6 05/23/2018   CL 104 05/23/2018   CO2 23 05/23/2018   GLUCOSE 77 05/23/2018   BUN 10 05/23/2018   CREATININE 0.96 05/23/2018   BILITOT 0.4 08/04/2018   ALKPHOS 100 08/04/2018   AST 30 08/04/2018   ALT 18 08/04/2018   PROT 7.1 08/04/2018   ALBUMIN 4.7 08/04/2018   CALCIUM 9.7 05/23/2018   GFRAA 69 05/23/2018    Speciality Comments: No specialty comments available.  Procedures:  No procedures performed Allergies: Etodolac   Assessment / Plan:     Visit Diagnoses: Psoriatic arthritis (HCC) - She has no synovitis or dactylitis on exam.  She has not had any recent psoriatic arthritis flares.  She has no joint pain, joint swelling, or morning stiffness at this time.  She has no psoriasis but nail pitting was noted.  She has no SI joint tenderness on exam.  No achilles tendonitis or plantar fasciitis on exam.  She is clinically doing well on Otezla 30 mg BID.  We will obtain X-rays today of hands and feet to assess for radiograph progression.  X-rays were reviewed with the patient  today in the office. She will continue on the current treatment regimen.  She does not need any refills.  She was advised to notify us if she develops increased joint pain or joint swelling.  She will follow up in 5 months. Plan: XR Hand 2 View Left, XR Hand 2 View Right, XR Foot 2 Views Left, XR Foot 2 Views Right.  X-ray of bilateral hands and feet were consistent with psoriatic arthritis.  Other psoriasis: She has no active psoriasis at this time.  She has nail pitting in her fingernails.   High risk medications (not anticoagulants) long-term use - Otezla 30 mg BID. She has not had any recent infections.   Right hip pain -She has limited ROM of the right hip joint on exam.  She has no discomfort at this time.  She requested X-rays of the right hip were obtained today. Plan: XR HIP UNILAT W OR W/O PELVIS 2-3 VIEWS LEFT.  X-ray was consistent with right hip mild osteoarthritis and right hip dysplasia.  I reviewed the x-rays with Dr. Cleophas Dunker.  At this point there is no need for surgery.  Osteopenia of multiple sites: She takes a calcium and vitamin D supplement on a daily basis.   Vitamin D deficiency: She takes a vitamin D supplement.   Other insomnia: She continues to have chronic insomnia.   Other medical conditions are listed as follows:   History of hyperlipidemia  History of anemia   Orders: Orders Placed This Encounter  Procedures  . XR Hand 2 View Left  . XR Hand 2 View Right  . XR Foot 2 Views Left  . XR Foot 2 Views Right  . XR HIP UNILAT W OR W/O PELVIS 2-3 VIEWS LEFT   No orders of the defined types were placed in this encounter.   Face-to-face time spent with patient was 30 minutes. Greater than 50% of time was spent in counseling and coordination of care.   Follow-Up Instructions: Return in about 5 months (around 04/14/2019) for Psoriatic arthritis.   Gearldine Bienenstock, PA-C   I examined and evaluated the patient with Sherron Ales PA.  Patient had no synovitis on  examination.  She has some synovial thickening.  X-rays obtained today did not show any radiographic progression in her hands and feet.  She had limited range of motion of  her right hip joint on my examination.  The x-ray was consistent with hip dysplasia and mild osteoarthritis.  No change in therapy was recommended at this point.  The plan of care was discussed as noted above.  Pollyann SavoyShaili Deveshwar, MD  Note - This record has been created using [employer] software.  Chart creation errors have been sought, but may not always  have been located. Such creation errors do not reflect on  the standard of medical care.

## 2018-11-09 ENCOUNTER — Other Ambulatory Visit: Payer: Self-pay | Admitting: Family Medicine

## 2018-11-14 ENCOUNTER — Ambulatory Visit (INDEPENDENT_AMBULATORY_CARE_PROVIDER_SITE_OTHER): Payer: Self-pay

## 2018-11-14 ENCOUNTER — Ambulatory Visit: Payer: Medicare Other | Admitting: Rheumatology

## 2018-11-14 ENCOUNTER — Encounter: Payer: Self-pay | Admitting: Physician Assistant

## 2018-11-14 VITALS — BP 103/56 | HR 53 | Resp 16 | Ht 62.0 in | Wt 148.2 lb

## 2018-11-14 DIAGNOSIS — M25551 Pain in right hip: Secondary | ICD-10-CM | POA: Diagnosis not present

## 2018-11-14 DIAGNOSIS — L408 Other psoriasis: Secondary | ICD-10-CM | POA: Diagnosis not present

## 2018-11-14 DIAGNOSIS — M25571 Pain in right ankle and joints of right foot: Secondary | ICD-10-CM | POA: Diagnosis not present

## 2018-11-14 DIAGNOSIS — M25572 Pain in left ankle and joints of left foot: Secondary | ICD-10-CM | POA: Diagnosis not present

## 2018-11-14 DIAGNOSIS — Z8639 Personal history of other endocrine, nutritional and metabolic disease: Secondary | ICD-10-CM

## 2018-11-14 DIAGNOSIS — Z79899 Other long term (current) drug therapy: Secondary | ICD-10-CM | POA: Diagnosis not present

## 2018-11-14 DIAGNOSIS — Z862 Personal history of diseases of the blood and blood-forming organs and certain disorders involving the immune mechanism: Secondary | ICD-10-CM

## 2018-11-14 DIAGNOSIS — M25542 Pain in joints of left hand: Secondary | ICD-10-CM | POA: Diagnosis not present

## 2018-11-14 DIAGNOSIS — M25541 Pain in joints of right hand: Secondary | ICD-10-CM | POA: Diagnosis not present

## 2018-11-14 DIAGNOSIS — M8589 Other specified disorders of bone density and structure, multiple sites: Secondary | ICD-10-CM

## 2018-11-14 DIAGNOSIS — E559 Vitamin D deficiency, unspecified: Secondary | ICD-10-CM

## 2018-11-14 DIAGNOSIS — L4050 Arthropathic psoriasis, unspecified: Secondary | ICD-10-CM | POA: Diagnosis not present

## 2018-11-14 DIAGNOSIS — G4709 Other insomnia: Secondary | ICD-10-CM

## 2018-11-28 ENCOUNTER — Ambulatory Visit: Payer: Medicare Other | Admitting: Family Medicine

## 2018-12-11 ENCOUNTER — Ambulatory Visit: Payer: Medicare Other | Admitting: Family Medicine

## 2018-12-11 ENCOUNTER — Encounter: Payer: Self-pay | Admitting: Family Medicine

## 2018-12-11 VITALS — BP 122/72 | Ht 62.0 in | Wt 149.6 lb

## 2018-12-11 DIAGNOSIS — E785 Hyperlipidemia, unspecified: Secondary | ICD-10-CM | POA: Diagnosis not present

## 2018-12-11 NOTE — Progress Notes (Signed)
   Subjective:    Patient ID: [name], female    DOB: 03-17-1946, 73 y.o.   MRN: [id number]  Hyperlipidemia  This is a new problem. The current episode started more than 1 year ago. Pertinent negatives include no chest pain or shortness of breath. Treatments tried: crestor. Risk factors for coronary artery disease include dyslipidemia and hypertension.   Psoriasis-patient states occasional arthralgia uses Otezla on a regular basis states is doing fair  Occasionally gets a brief spell that lasts a few seconds where she feels an unusual wave over her head and then it goes away there is no pain with it no double vision nausea or vomiting  Review of Systems  Constitutional: Negative for activity change, appetite change and fatigue.  HENT: Negative for congestion and rhinorrhea.   Respiratory: Negative for cough and shortness of breath.   Cardiovascular: Negative for chest pain and leg swelling.  Gastrointestinal: Negative for abdominal pain and diarrhea.  Endocrine: Negative for polydipsia and polyphagia.  Skin: Negative for color change.  Neurological: Negative for dizziness and weakness.  Psychiatric/Behavioral: Negative for behavioral problems and confusion.       Objective:   Physical Exam Vitals signs reviewed.  Constitutional:      General: She is not in acute distress. HENT:     Head: Normocephalic and atraumatic.  Eyes:     General:        Right eye: No discharge.        Left eye: No discharge.  Neck:     Trachea: No tracheal deviation.  Cardiovascular:     Rate and Rhythm: Normal rate and regular rhythm.     Heart sounds: Normal heart sounds. No murmur.  Pulmonary:     Effort: Pulmonary effort is normal. No respiratory distress.     Breath sounds: Normal breath sounds.  Lymphadenopathy:     Cervical: No cervical adenopathy.  Skin:    General: Skin is warm and dry.  Neurological:     Mental Status: She is alert.     Coordination: Coordination normal.    Psychiatric:        Behavior: Behavior normal.           Assessment & Plan:  Unusual wave of light headed issue-I doubt that this is a stroke or tumor but I told patient if this becomes more frequent or more severe we would need to run some test  Hyperlipidemia-continue medication and is benefiting her most recent labs reviewed with the patient  Follow-up for wellness labs and recheck of hyperlipidemia in approximately 6 months

## 2019-01-31 ENCOUNTER — Telehealth: Payer: Self-pay

## 2019-01-31 MED ORDER — APREMILAST 30 MG PO TABS: 1.0000 | ORAL_TABLET | Freq: Two times a day (BID) | ORAL | 0 refills | Status: DC

## 2019-01-31 NOTE — Telephone Encounter (Signed)
Received refill request via fax from [facility] pharmacy.   Last Visit: 11/14/2018 Next Visit: 04/11/2019  Okay to refill per Dr. Corliss Skains.

## 2019-03-02 ENCOUNTER — Other Ambulatory Visit: Payer: Self-pay | Admitting: Family Medicine

## 2019-03-29 NOTE — Progress Notes (Signed)
Office Visit Note  Patient: [name]             Date of Birth: Aug 19, 1946           MRN: [id number]             PCP: Babs SciaraLuking, Scott A, MD Referring: Babs SciaraLuking, Scott A, MD Visit Date: 04/11/2019 Occupation: @GUAROCC @  Subjective:  Left knee joint pain and swelling    History of Present Illness: [name] is a 73 y.o. female with history of psoriatic arthritis and osteopenia.  She is taking Otezla 30 mg BID which she receives through patient assistance program. She reports about 2 weeks ago she developed pain and swelling in her left knee joint.  She states that she went on a long walk and was pushing the case more than she normally does.  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  She has been having intermittent pain since then.  She states that she tried taking 2 Aleve's 1 day but developed GI upset and discontinued.  She has been using a crutch as well as icing the left knee joint.  She states that the left knee feels better with rest but if she standing for prolonged peers of time the pain increases.  She denies any other joint pain or joint swelling at this time.  She denies any Achilles tendinitis or plantar fasciitis.  She denies any SI joint pain.  She denies any active psoriasis at this time.  She reports she has been trying to lose weight recently.  Activities of Daily Living:  Patient reports morning stiffness for 0  minutes.   Patient Denies nocturnal pain.  Difficulty dressing/grooming: Denies Difficulty climbing stairs: Reports Difficulty getting out of chair: Reports Difficulty using hands for taps, buttons, cutlery, and/or writing: Denies  Review of Systems  Constitutional: Negative for fatigue.  HENT: Negative for mouth sores, mouth dryness and nose dryness.   Eyes: Negative for pain, visual disturbance and dryness.  Respiratory: Negative  for cough, hemoptysis, shortness of breath and difficulty breathing.   Cardiovascular: Negative for chest pain, palpitations, hypertension and swelling in legs/feet.  Gastrointestinal: Negative for blood in stool, constipation and diarrhea.  Endocrine: Negative for increased urination.  Genitourinary: Negative for painful urination.  Musculoskeletal: Positive for arthralgias, joint pain and joint swelling. Negative for myalgias, muscle weakness, morning stiffness, muscle tenderness and myalgias.  Skin: Negative for color change, pallor, rash, hair loss, nodules/bumps, skin tightness, ulcers and sensitivity to sunlight.  Allergic/Immunologic: Negative for susceptible to infections.  Neurological: Negative for dizziness, numbness, headaches and weakness.  Hematological: Negative for swollen glands.  Psychiatric/Behavioral: Negative for depressed mood and sleep disturbance. The patient is not nervous/anxious.     PMFS History:  Patient Active Problem List   Diagnosis Date Noted  . History of colonic polyps 07/04/2018  . History of hyperlipidemia 02/09/2017  . Other psoriasis 12/30/2016  . High risk medications (not anticoagulants) long-term use 12/30/2016  . Psoriatic arthritis (HCC) 12/27/2014  . Vitamin D deficiency 08/06/2013  . Osteopenia 08/06/2013  . Insomnia 02/22/2013  . Hyperlipidemia 01/09/2013  . Anemia 09/12/2012  . Toe cyanosis 08/08/2012  . Osteoarthritis of shoulder 09/15/2010    Past Medical History:  Diagnosis Date  . Hx of colonic polyps   . Hypercholesterolemia   . Psoriatic arthritis (HCC)     Family History  Problem  Relation Age of Onset  . Anesthesia problems Mother   . Cancer Mother   . Hypertension Mother   . Cancer Father   . Diabetes Father   . Deep vein thrombosis Father   . Hypertension Father   . Arthritis Other        FH  . Diabetes Other        FH  . Cancer Other        FH  . Hypertension Sister   . Hypertension Brother   . Heart disease  Brother   . Migraines Sister   . Migraines Sister   . Hypotension Neg Hx   . Malignant hyperthermia Neg Hx   . Pseudochol deficiency Neg Hx    Past Surgical History:  Procedure Laterality Date  . ABDOMINAL HYSTERECTOMY    . COLONOSCOPY  08/25/2011   Procedure: COLONOSCOPY;  Surgeon: Malissa HippoNajeeb U Rehman, MD;  [facility];  Service: Endoscopy;  Laterality: N/A;  12:00  . COLONOSCOPY N/A 09/21/2018   Procedure: COLONOSCOPY;  Surgeon: Malissa Hippoehman, Najeeb U, MD;  Location: [facility];  Service: Endoscopy;  Laterality: N/A;  830  . left knee stable out  1966  . left knee staple in  1962  . VESICOVAGINAL FISTULA CLOSURE W/ TAH  1986   Social History   Social History Narrative  . Not on file   Immunization History  Administered Date(s) Administered  . Influenza-Unspecified 07/13/2016, 08/02/2018  . Pneumococcal Conjugate-13 12/27/2014  . Pneumococcal Polysaccharide-23 03/30/2016  . Pneumococcal-Unspecified 11/06/2009     Objective: Vital Signs: BP 121/68 (BP Location: Left Arm, Patient Position: Sitting, Cuff Size: Normal)   Pulse (!) 54   Resp 13   Ht 5\' 2"  (1.575 m)   Wt 141 lb 9.6 oz (64.2 kg)   BMI 25.90 kg/m    Physical Exam Vitals signs and nursing note reviewed.  Constitutional:      Appearance: She is well-developed.  HENT:     Head: Normocephalic and atraumatic.  Eyes:     Conjunctiva/sclera: Conjunctivae normal.  Neck:     Musculoskeletal: Normal range of motion.  Cardiovascular:     Rate and Rhythm: Normal rate and regular rhythm.     Heart sounds: Normal heart sounds.  Pulmonary:     Effort: Pulmonary effort is normal.     Breath sounds: Normal breath sounds.  Abdominal:     General: Bowel sounds are normal.     Palpations: Abdomen is soft.  Lymphadenopathy:     Cervical: No cervical adenopathy.  Skin:    General: Skin is warm and dry.     Capillary Refill: Capillary refill takes less than 2 seconds.  Neurological:     Mental Status: She is  alert and oriented to person, place, and time.  Psychiatric:        Behavior: Behavior normal.      Musculoskeletal Exam: C-spine, thoracic spine, and lumbar spine good ROM.  No midline spinal tenderness.  No SI joint tenderness.  Shoulder joints, elbow joints, wrist joints, MCPs, PIPs, and DIPs good ROM with no synovitis. PIP and DIP synovial thickening but no synovitis. Hip joints good ROM.  Left knee joint moderate effusion and warmth.  Right knee has good ROM with no warmth or effusion.  Ankle joints have good ROM with no with no tenderness or swelling.   CDAI Exam: CDAI Score: 0  Patient Global: 0 mm; Provider Global: 0 mm Swollen: 0 ; Tender: 0  Joint Exam  No joint exam has been documented for this visit   There is currently no information documented on the homunculus. Go to the Rheumatology activity and complete the homunculus joint exam.  Investigation: No additional findings.  Imaging: Xr Knee 3 View Left  Result Date: 04/11/2019 Severe patellofemoral narrowing was noted.  Moderate medial compartment narrowing was noted.  No chondrocalcinosis was noted. Impression: These findings are consistent with severe chondromalacia patella and moderate osteoarthritis of the knee joint.   Recent Labs: Lab Results  Component Value Date   WBC 6.5 05/23/2018   HGB 12.4 05/23/2018   PLT 204 05/23/2018   NA 142 05/23/2018   K 4.6 05/23/2018   CL 104 05/23/2018   CO2 23 05/23/2018   GLUCOSE 77 05/23/2018   BUN 10 05/23/2018   CREATININE 0.96 05/23/2018   BILITOT 0.4 08/04/2018   ALKPHOS 100 08/04/2018   AST 30 08/04/2018   ALT 18 08/04/2018   PROT 7.1 08/04/2018   ALBUMIN 4.7 08/04/2018   CALCIUM 9.7 05/23/2018   GFRAA 69 05/23/2018    Speciality Comments: No specialty comments available.  Procedures:  Large Joint Inj: L knee on 04/11/2019 11:13 AM Indications: pain Details: 27 G 1.5 in needle, medial approach  Arthrogram: No  Medications: 3 mL lidocaine 1 %; 60 mg  triamcinolone acetonide 40 MG/ML Aspirate: 16 mL; sent for lab analysis Outcome: tolerated well, no immediate complications Procedure, treatment alternatives, risks and benefits explained, specific risks discussed. Consent was given by the patient. Immediately prior to procedure a time out was called to verify the correct patient, procedure, equipment, support staff and site/side marked as required. Patient was prepped and draped in the usual sterile fashion.     Allergies: Etodolac   Assessment / Plan:     Visit Diagnoses: Psoriatic arthritis ([place]) -She presents today with a moderate left knee joint effusion and warmth.  She has been experiencing left knee joint pain and swelling for the past 2 weeks.  A left knee joint aspiration and cortisone injection was performed today.  She has no other joint pain or joint swelling at this time.  She has no Achilles denies or plantar fasciitis.  She has no SI joint tenderness on exam.  She denied any active psoriasis.  Overall she is clinically been doing well on Otezla 30 mg 1 tablet twice daily.  She will continue on this current treatment regimen.  She was advised to notify us if her left knee joint pain and swelling is persistent.  We sent the aspirated fluid for analysis and will call her with the results.  She was advised to notify us if develops any increased joint pain or joint swelling.  She will follow-up in the office in 5 months.  Other psoriasis - She has no active psoriasis or nail pitting at this time.  High risk medications (not anticoagulants) long-term use - She is taking Otezla 30 mg BID.   Arthralgia of left knee - She presents today with left knee joint pain that started 2 weeks ago.  She denies any recent injuries or falls.  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  She states that the following day she developed pain and swelling in the left knee  joint.  She has been using a crutch, icing, and tried taking Aleve for pain relief.  She developed GI upset after taking 2 doses of Aleve and discontinued.  She has a moderate left knee joint effusion and warmth on exam today.  She has discomfort with range of motion.  A left knee joint aspiration was performed and 16 mL was drawn off.  We sent the fluid aspiration for analysis.  X-rays of the left knee were obtained today that revealed severe chondromalacia patella and moderate medial compartment narrowing.  A cortisone injection was performed.  Procedure note was completed above.  Aftercare was discussed.  She is advised to monitor her blood pressure closely following the cortisone injection. She was advised to notify us here for pain and swelling is persistent following the injection today.  Plan: XR KNEE 3 VIEW LEFT  Effusion of left knee joint -She presented today with a moderate left knee joint effusion.  16 mL of aspirated fluid was drawn off and sent for analysis.  A cortisone injection was performed.  The procedure note was completed above.  Plan: Synovial cell count + diff, w/ crystals, Anaerobic and Aerobic Culture  Osteopenia of multiple sites -She is taking a calcium and vitamin D supplement daily. Last DEXA 02/14/2019 and results were normal.  Other medical conditions are listed as follows:   History of anemia   Other insomnia   Vitamin D deficiency   History of hyperlipidemia     Orders: Orders Placed This Encounter  Procedures  . Large Joint Inj  . Anaerobic and Aerobic Culture  . XR KNEE 3 VIEW LEFT  . Synovial cell count + diff, w/ crystals   No orders of the defined types were placed in this encounter.   Face-to-face time spent with patient was 30 minutes. Greater than 50% of time was spent in counseling and coordination of care.  Follow-Up Instructions: Return in about 5 months (around 09/11/2019) for Psoriatic arthritis.   Gearldine Bienenstockaylor M Dale, PA-C   I examined and  evaluated the patient with Sherron Alesaylor Dale PA.  Patient had been doing clinically well on [place].  Today she presented with left knee joint effusion.  We aspirated the knee joint and sent the synovial fluid for analysis.  Based on the analysis we can determine if you need to give her more aggressive therapy for immunosuppression or  it is due to underlying osteoarthritis.  The plan of care was discussed as noted above.  Pollyann SavoyShaili Deveshwar, MD  Note - This record has been created using Animal nutritionistDragon software.  Chart creation errors have been sought, but may not always  have been located. Such creation errors do not reflect on  the standard of medical care.

## 2019-04-09 ENCOUNTER — Telehealth: Payer: Self-pay | Admitting: *Deleted

## 2019-04-09 NOTE — Telephone Encounter (Signed)
Dr. Estanislado Pandy received call from answering service at 9:20 am on 04/07/19. Patient has been on [place] for 1 year, Patient has been taking Aleve and Advil for discomfort. Patient had complaints of abdominal and stomach pain. Per Dr. Estanislado Pandy patient advised to try Maalox or Priolsec over the counter and to contact the office if she did not improve.

## 2019-04-11 ENCOUNTER — Other Ambulatory Visit: Payer: Self-pay

## 2019-04-11 ENCOUNTER — Encounter: Payer: Self-pay | Admitting: Physician Assistant

## 2019-04-11 ENCOUNTER — Ambulatory Visit: Payer: Self-pay

## 2019-04-11 ENCOUNTER — Ambulatory Visit (INDEPENDENT_AMBULATORY_CARE_PROVIDER_SITE_OTHER): Payer: Medicare Other | Admitting: Physician Assistant

## 2019-04-11 VITALS — BP 121/68 | HR 54 | Resp 13 | Ht 62.0 in | Wt 141.6 lb

## 2019-04-11 DIAGNOSIS — M25562 Pain in left knee: Secondary | ICD-10-CM | POA: Diagnosis not present

## 2019-04-11 DIAGNOSIS — M8589 Other specified disorders of bone density and structure, multiple sites: Secondary | ICD-10-CM | POA: Diagnosis not present

## 2019-04-11 DIAGNOSIS — Z79899 Other long term (current) drug therapy: Secondary | ICD-10-CM

## 2019-04-11 DIAGNOSIS — E559 Vitamin D deficiency, unspecified: Secondary | ICD-10-CM

## 2019-04-11 DIAGNOSIS — L4050 Arthropathic psoriasis, unspecified: Secondary | ICD-10-CM | POA: Diagnosis not present

## 2019-04-11 DIAGNOSIS — L408 Other psoriasis: Secondary | ICD-10-CM | POA: Diagnosis not present

## 2019-04-11 DIAGNOSIS — M25462 Effusion, left knee: Secondary | ICD-10-CM

## 2019-04-11 DIAGNOSIS — M1712 Unilateral primary osteoarthritis, left knee: Secondary | ICD-10-CM

## 2019-04-11 DIAGNOSIS — Z862 Personal history of diseases of the blood and blood-forming organs and certain disorders involving the immune mechanism: Secondary | ICD-10-CM

## 2019-04-11 DIAGNOSIS — Z8639 Personal history of other endocrine, nutritional and metabolic disease: Secondary | ICD-10-CM

## 2019-04-11 DIAGNOSIS — G4709 Other insomnia: Secondary | ICD-10-CM

## 2019-04-13 NOTE — Progress Notes (Signed)
Culture does not show growth.  No crystals present.

## 2019-04-13 NOTE — Progress Notes (Signed)
The effusion was due to a psoriatic arthritis flare.

## 2019-04-18 LAB — SYNOVIAL CELL COUNT + DIFF, W/ CRYSTALS
Basophils, %: 0 %
Eosinophils-Synovial: 0 % (ref 0–2)
Lymphocytes-Synovial Fld: 39 % (ref 0–74)
Monocyte/Macrophage: 45 % (ref 0–69)
Neutrophil, Synovial: 6 % (ref 0–24)
Synoviocytes, %: 10 % (ref 0–15)
WBC, Synovial: 282 cells/uL — ABNORMAL HIGH (ref <150)

## 2019-04-18 LAB — ANAEROBIC AND AEROBIC CULTURE
AER RESULT:: NO GROWTH
MICRO NUMBER:: 648932
MICRO NUMBER:: 648933
SPECIMEN QUALITY:: ADEQUATE
SPECIMEN QUALITY:: ADEQUATE

## 2019-05-28 ENCOUNTER — Telehealth: Payer: Self-pay | Admitting: Family Medicine

## 2019-05-28 DIAGNOSIS — R5383 Other fatigue: Secondary | ICD-10-CM

## 2019-05-28 DIAGNOSIS — E785 Hyperlipidemia, unspecified: Secondary | ICD-10-CM

## 2019-05-28 DIAGNOSIS — D649 Anemia, unspecified: Secondary | ICD-10-CM

## 2019-05-28 DIAGNOSIS — I10 Essential (primary) hypertension: Secondary | ICD-10-CM

## 2019-05-28 NOTE — Telephone Encounter (Signed)
Last labs 08/2018:  Lipid and Liver

## 2019-05-28 NOTE — Telephone Encounter (Signed)
Patient has an appt for her physical on 06-19-19 and would like lab orders put in for [place].

## 2019-05-29 NOTE — Telephone Encounter (Signed)
I recommend lipid, liver, metabolic 7, TSH, CBC, ferritin Hypothyroidism hyperlipidemia iron deficient anemia

## 2019-05-30 NOTE — Telephone Encounter (Signed)
Blood work ordered in Epic. Left message to return call to notify patient. 

## 2019-05-30 NOTE — Telephone Encounter (Signed)
Patient notified

## 2019-06-01 ENCOUNTER — Other Ambulatory Visit: Payer: Self-pay | Admitting: Family Medicine

## 2019-06-01 DIAGNOSIS — Z1231 Encounter for screening mammogram for malignant neoplasm of breast: Secondary | ICD-10-CM

## 2019-06-07 LAB — CBC WITH DIFFERENTIAL/PLATELET
Basophils Absolute: 0 x10E3/uL (ref 0.0–0.2)
Basos: 1 %
EOS (ABSOLUTE): 0 x10E3/uL (ref 0.0–0.4)
Eos: 1 %
Hematocrit: 36 % (ref 34.0–46.6)
Hemoglobin: 12 g/dL (ref 11.1–15.9)
Immature Grans (Abs): 0 x10E3/uL (ref 0.0–0.1)
Immature Granulocytes: 0 %
Lymphocytes Absolute: 3.2 x10E3/uL — ABNORMAL HIGH (ref 0.7–3.1)
Lymphs: 48 %
MCH: 24.5 pg — ABNORMAL LOW (ref 26.6–33.0)
MCHC: 33.3 g/dL (ref 31.5–35.7)
MCV: 74 fL — ABNORMAL LOW (ref 79–97)
Monocytes Absolute: 0.4 x10E3/uL (ref 0.1–0.9)
Monocytes: 6 %
Neutrophils Absolute: 2.9 x10E3/uL (ref 1.4–7.0)
Neutrophils: 44 %
Platelets: 202 x10E3/uL (ref 150–450)
RBC: 4.9 x10E6/uL (ref 3.77–5.28)
RDW: 15.7 % — ABNORMAL HIGH (ref 11.7–15.4)
WBC: 6.6 x10E3/uL (ref 3.4–10.8)

## 2019-06-07 LAB — BASIC METABOLIC PANEL WITH GFR
BUN/Creatinine Ratio: 9 — ABNORMAL LOW (ref 12–28)
BUN: 7 mg/dL — ABNORMAL LOW (ref 8–27)
CO2: 20 mmol/L (ref 20–29)
Calcium: 9.5 mg/dL (ref 8.7–10.3)
Chloride: 105 mmol/L (ref 96–106)
Creatinine, Ser: 0.79 mg/dL (ref 0.57–1.00)
GFR calc Af Amer: 86 mL/min/1.73 (ref >59)
GFR calc non Af Amer: 75 mL/min/1.73 (ref >59)
Glucose: 82 mg/dL (ref 65–99)
Potassium: 4.1 mmol/L (ref 3.5–5.2)
Sodium: 141 mmol/L (ref 134–144)

## 2019-06-07 LAB — HEPATIC FUNCTION PANEL
ALT: 13 IU/L (ref 0–32)
AST: 24 IU/L (ref 0–40)
Albumin: 4.5 g/dL (ref 3.7–4.7)
Alkaline Phosphatase: 93 IU/L (ref 39–117)
Bilirubin Total: 0.3 mg/dL (ref 0.0–1.2)
Bilirubin, Direct: 0.11 mg/dL (ref 0.00–0.40)
Total Protein: 7.1 g/dL (ref 6.0–8.5)

## 2019-06-07 LAB — LIPID PANEL
Chol/HDL Ratio: 2.6 ratio (ref 0.0–4.4)
Cholesterol, Total: 208 mg/dL — ABNORMAL HIGH (ref 100–199)
HDL: 81 mg/dL (ref >39)
LDL Chol Calc (NIH): 115 mg/dL — ABNORMAL HIGH (ref 0–99)
Triglycerides: 68 mg/dL (ref 0–149)
VLDL Cholesterol Cal: 12 mg/dL (ref 5–40)

## 2019-06-07 LAB — TSH: TSH: 1.15 u[IU]/mL (ref 0.450–4.500)

## 2019-06-07 LAB — FERRITIN: Ferritin: 89 ng/mL (ref 15–150)

## 2019-06-19 ENCOUNTER — Other Ambulatory Visit: Payer: Self-pay

## 2019-06-19 ENCOUNTER — Ambulatory Visit (INDEPENDENT_AMBULATORY_CARE_PROVIDER_SITE_OTHER): Payer: Medicare Other | Admitting: Family Medicine

## 2019-06-19 ENCOUNTER — Encounter: Payer: Self-pay | Admitting: Family Medicine

## 2019-06-19 VITALS — BP 130/70 | Temp 98.5°F | Ht 61.5 in | Wt 142.0 lb

## 2019-06-19 DIAGNOSIS — Z23 Encounter for immunization: Secondary | ICD-10-CM

## 2019-06-19 DIAGNOSIS — Z0000 Encounter for general adult medical examination without abnormal findings: Secondary | ICD-10-CM

## 2019-06-19 DIAGNOSIS — E785 Hyperlipidemia, unspecified: Secondary | ICD-10-CM | POA: Diagnosis not present

## 2019-06-19 MED ORDER — ROSUVASTATIN CALCIUM 5 MG PO TABS: 5.0000 mg | ORAL_TABLET | Freq: Every day | ORAL | 1 refills | Status: DC

## 2019-06-19 NOTE — Progress Notes (Signed)
Subjective:    Patient ID: [name], female    DOB: [birth year], 73 y.o.   MRN: [id number]  HPI AWV- Annual Wellness Visit Patient states normal safety She passes cognitive test She does take her cholesterol medicine she follows up on this for a yearly basis.  Denies any major setbacks currently The patient was seen for their annual wellness visit. The patient's past medical history, surgical history, and family history were reviewed. Pertinent vaccines were reviewed ( tetanus, pneumonia, shingles, flu) The patient's medication list was reviewed and updated.  The height and weight were entered.  BMI recorded in electronic record elsewhere  Cognitive screening was completed. Outcome of Mini - Cog: pass   Falls /depression screening electronically recorded within record elsewhere  Current tobacco usage: none (All patients who use tobacco were given written and verbal information on quitting)  Recent listing of emergency department/hospitalizations over the past year were reviewed.  current specialist the patient sees on a regular basis: Dr. Patrecia Pour ( rheumatologist)   Medicare annual wellness visit patient questionnaire was reviewed.  A written screening schedule for the patient for the next 5-10 years was given. Appropriate discussion of followup regarding next visit was discussed.       Review of Systems  Constitutional: Negative for activity change, appetite change and fatigue.  HENT: Negative for congestion and rhinorrhea.   Eyes: Negative for discharge.  Respiratory: Negative for cough, chest tightness and wheezing.   Cardiovascular: Negative for chest pain.  Gastrointestinal: Negative for abdominal pain, blood in stool and vomiting.  Endocrine: Negative for polyphagia.  Genitourinary: Negative for difficulty urinating and frequency.  Musculoskeletal: Negative for neck pain.  Skin: Negative for color change.  Allergic/Immunologic: Negative for  environmental allergies and food allergies.  Neurological: Negative for weakness and headaches.  Psychiatric/Behavioral: Negative for agitation and behavioral problems.       Objective:   Physical Exam Vitals signs reviewed.  Constitutional:      General: She is not in acute distress. HENT:     Head: Normocephalic and atraumatic.  Eyes:     General:        Right eye: No discharge.        Left eye: No discharge.  Neck:     Trachea: No tracheal deviation.  Cardiovascular:     Rate and Rhythm: Normal rate and regular rhythm.     Heart sounds: Normal heart sounds. No murmur.  Pulmonary:     Effort: Pulmonary effort is normal. No respiratory distress.     Breath sounds: Normal breath sounds.  Lymphadenopathy:     Cervical: No cervical adenopathy.  Skin:    General: Skin is warm and dry.  Neurological:     Mental Status: She is alert.     Coordination: Coordination normal.  Psychiatric:        Behavior: Behavior normal.    Patient defers breast exam       Assessment & Plan:  Adult wellness-complete.wellness physical was conducted today. Importance of diet and exercise were discussed in detail.  In addition to this a discussion regarding safety was also covered. We also reviewed over immunizations and gave recommendations regarding current immunization needed for age.  In addition to this additional areas were also touched on including: Preventative health exams needed:  Colonoscopy possibly 2024 depending on her current overall health Vernard Gambles Grix recommended patient to consider Blood work was reviewed with patient Patient was advised yearly wellness exam Patient to follow-up on a yearly  basis for wellness Follow-up 6 months for her hyperlipidemia

## 2019-06-19 NOTE — Patient Instructions (Signed)
Results for orders placed or performed in visit on 05/28/19  Lipid panel  Result Value Ref Range   Cholesterol, Total 208 (H) 100 - 199 mg/dL   Triglycerides 68 0 - 149 mg/dL   HDL 81 >39 mg/dL   VLDL Cholesterol Cal 12 5 - 40 mg/dL   LDL Chol Calc (NIH) 115 (H) 0 - 99 mg/dL   Chol/HDL Ratio 2.6 0.0 - 4.4 ratio  Hepatic function panel  Result Value Ref Range   Total Protein 7.1 6.0 - 8.5 g/dL   Albumin 4.5 3.7 - 4.7 g/dL   Bilirubin Total 0.3 0.0 - 1.2 mg/dL   Bilirubin, Direct 0.11 0.00 - 0.40 mg/dL   Alkaline Phosphatase 93 39 - 117 IU/L   AST 24 0 - 40 IU/L   ALT 13 0 - 32 IU/L  Basic metabolic panel  Result Value Ref Range   Glucose 82 65 - 99 mg/dL   BUN 7 (L) 8 - 27 mg/dL   Creatinine, Ser 0.79 0.57 - 1.00 mg/dL   GFR calc non Af Amer 75 >59 mL/min/1.73   GFR calc Af Amer 86 >59 mL/min/1.73   BUN/Creatinine Ratio 9 (L) 12 - 28   Sodium 141 134 - 144 mmol/L   Potassium 4.1 3.5 - 5.2 mmol/L   Chloride 105 96 - 106 mmol/L   CO2 20 20 - 29 mmol/L   Calcium 9.5 8.7 - 10.3 mg/dL  TSH  Result Value Ref Range   TSH 1.150 0.450 - 4.500 uIU/mL  CBC with Differential/Platelet  Result Value Ref Range   WBC 6.6 3.4 - 10.8 x10E3/uL   RBC 4.90 3.77 - 5.28 x10E6/uL   Hemoglobin 12.0 11.1 - 15.9 g/dL   Hematocrit 36.0 34.0 - 46.6 %   MCV 74 (L) 79 - 97 fL   MCH 24.5 (L) 26.6 - 33.0 pg   MCHC 33.3 31.5 - 35.7 g/dL   RDW 15.7 (H) 11.7 - 15.4 %   Platelets 202 150 - 450 x10E3/uL   Neutrophils 44 Not Estab. %   Lymphs 48 Not Estab. %   Monocytes 6 Not Estab. %   Eos 1 Not Estab. %   Basos 1 Not Estab. %   Neutrophils Absolute 2.9 1.4 - 7.0 x10E3/uL   Lymphocytes Absolute 3.2 (H) 0.7 - 3.1 x10E3/uL   Monocytes Absolute 0.4 0.1 - 0.9 x10E3/uL   EOS (ABSOLUTE) 0.0 0.0 - 0.4 x10E3/uL   Basophils Absolute 0.0 0.0 - 0.2 x10E3/uL   Immature Granulocytes 0 Not Estab. %   Immature Grans (Abs) 0.0 0.0 - 0.1 x10E3/uL  Ferritin  Result Value Ref Range   Ferritin 89 15 - 150 ng/mL

## 2019-06-28 DIAGNOSIS — H903 Sensorineural hearing loss, bilateral: Secondary | ICD-10-CM | POA: Insufficient documentation

## 2019-07-10 ENCOUNTER — Other Ambulatory Visit: Payer: Self-pay

## 2019-07-10 ENCOUNTER — Ambulatory Visit
Admission: RE | Admit: 2019-07-10 | Discharge: 2019-07-10 | Disposition: A | Discharge status: Home / Self Care | Payer: Medicare Other | Source: Ambulatory Visit | Attending: Family Medicine | Admitting: Family Medicine

## 2019-07-10 DIAGNOSIS — Z1231 Encounter for screening mammogram for malignant neoplasm of breast: Secondary | ICD-10-CM

## 2019-08-22 NOTE — Progress Notes (Deleted)
Office Visit Note  Patient: [name]             Date of Birth: [birth year]           MRN: [id number]             PCP: Kathyrn Drown, MD Referring: Kathyrn Drown, MD Visit Date: 09/05/2019 Occupation: @GUAROCC @  Subjective:  No chief complaint on file.   History of Present Illness: [name] is a 73 y.o. female ***   Activities of Daily Living:  Patient reports morning stiffness for *** {minute/hour:19697}.   Patient {ACTIONS;DENIES/REPORTS:21021675::"Denies"} nocturnal pain.  Difficulty dressing/grooming: {ACTIONS;DENIES/REPORTS:21021675::"Denies"} Difficulty climbing stairs: {ACTIONS;DENIES/REPORTS:21021675::"Denies"} Difficulty getting out of chair: {ACTIONS;DENIES/REPORTS:21021675::"Denies"} Difficulty using hands for taps, buttons, cutlery, and/or writing: {ACTIONS;DENIES/REPORTS:21021675::"Denies"}  No Rheumatology ROS completed.   PMFS History:  Patient Active Problem List   Diagnosis Date Noted  . History of colonic polyps 07/04/2018  . History of hyperlipidemia 02/09/2017  . Other psoriasis 12/30/2016  . High risk medications (not anticoagulants) long-term use 12/30/2016  . Psoriatic arthritis ([place]) 12/27/2014  . Vitamin D deficiency 08/06/2013  . Osteopenia 08/06/2013  . Insomnia 02/22/2013  . Hyperlipidemia 01/09/2013  . Anemia 09/12/2012  . Toe cyanosis 08/08/2012  . Osteoarthritis of shoulder 09/15/2010    Past Medical History:  Diagnosis Date  . Hx of colonic polyps   . Hypercholesterolemia   . Psoriatic arthritis ([place])     Family History  Problem Relation Age of Onset  . Anesthesia problems Mother   . Cancer Mother   . Hypertension Mother   . Breast cancer Mother   . Cancer Father   . Diabetes Father   . Deep vein thrombosis Father   . Hypertension Father   . Arthritis Other        FH  . Diabetes Other        FH  . Cancer Other        FH  . Hypertension Sister   . Hypertension Brother   . Heart disease Brother    . Migraines Sister   . Migraines Sister   . Hypotension Neg Hx   . Malignant hyperthermia Neg Hx   . Pseudochol deficiency Neg Hx    Past Surgical History:  Procedure Laterality Date  . ABDOMINAL HYSTERECTOMY    . COLONOSCOPY  08/25/2011   Procedure: COLONOSCOPY;  Surgeon: Rogene Houston, MD;  [facility];  Service: Endoscopy;  Laterality: N/A;  12:00  . COLONOSCOPY N/A 09/21/2018   Procedure: COLONOSCOPY;  Surgeon: Rogene Houston, MD;  [facility];  Service: Endoscopy;  Laterality: N/A;  830  . left knee stable out  1966  . left knee staple in  1962  . VESICOVAGINAL FISTULA CLOSURE W/ TAH  1986   Social History   Social History Narrative  . Not on file   Immunization History  Administered Date(s) Administered  . Influenza,inj,Quad PF,6+ Mos 06/19/2019  . Influenza-Unspecified 07/13/2016, 08/02/2018  . Pneumococcal Conjugate-13 12/27/2014  . Pneumococcal Polysaccharide-23 03/30/2016  . Pneumococcal-Unspecified 11/06/2009     Objective: Vital Signs: There were no vitals taken for this visit.   Physical Exam   Musculoskeletal Exam: ***  CDAI Exam: CDAI Score: - Patient Global: -; Provider Global: - Swollen: -; Tender: - Joint Exam   No joint exam has been documented for this visit   There is currently no information documented on the homunculus. Go to the Rheumatology activity and complete the homunculus joint exam.  Investigation: No additional findings.  Imaging: No results found.  Recent Labs: Lab Results  Component Value Date   WBC 6.6 06/06/2019   HGB 12.0 06/06/2019   PLT 202 06/06/2019   NA 141 06/06/2019   K 4.1 06/06/2019   CL 105 06/06/2019   CO2 20 06/06/2019   GLUCOSE 82 06/06/2019   BUN 7 (L) 06/06/2019   CREATININE 0.79 06/06/2019   BILITOT 0.3 06/06/2019   ALKPHOS 93 06/06/2019   AST 24 06/06/2019   ALT 13 06/06/2019   PROT 7.1 06/06/2019   ALBUMIN 4.5 06/06/2019   CALCIUM 9.5 06/06/2019   GFRAA 86  06/06/2019    Speciality Comments: No specialty comments available.  Procedures:  No procedures performed Allergies: Etodolac   Assessment / Plan:     Visit Diagnoses: No diagnosis found.  Orders: No orders of the defined types were placed in this encounter.  No orders of the defined types were placed in this encounter.   Face-to-face time spent with patient was *** minutes. Greater than 50% of time was spent in counseling and coordination of care.  Follow-Up Instructions: No follow-ups on file.   Ellen Henri, CMA  Note - This record has been created using Animal nutritionist.  Chart creation errors have been sought, but may not always  have been located. Such creation errors do not reflect on  the standard of medical care.

## 2019-08-23 ENCOUNTER — Telehealth: Payer: Self-pay | Admitting: Pharmacist

## 2019-08-23 NOTE — Telephone Encounter (Signed)
Patient is due to renew [place] patient assistance 3 O[place] support.  Provider portion completed.  Will fax once we receive completed patient application along with supporting income documents.  Mariella Saa, PharmD, [place], [place] Clinical Specialty Pharmacist [phone]  08/23/2019 11:55 AM

## 2019-09-03 NOTE — Progress Notes (Signed)
Virtual Visit via Telephone Note  I connected with [name] on 09/04/19 at  2:30 PM EST by telephone and verified that I am speaking with the correct person using two identifiers.  Location: Patient: Home  Provider: Clinic  This service was conducted via virtual visit.  The patient was located at home. I was located in my office.  Consent was obtained prior to the virtual visit and is aware of possible charges through their insurance for this visit.  The patient is an established patient.  Dr. Corliss Skains, MD conducted the virtual visit and Sherron Ales, PA-C acted as scribe during the service.  Office staff helped with scheduling follow up visits after the service was conducted.   I discussed the limitations, risks, security and privacy concerns of performing an evaluation and management service by telephone and the availability of in person appointments. I also discussed with the patient that there may be a patient responsible charge related to this service. The patient expressed understanding and agreed to proceed.  CC: Medication monitoring  History of Present Illness: Patient is a 73 year old female with a history of psoriatic arthritis and osteoarthritis.  She is taking Otezla 30 mg 1 tablet BID. She denies any recent psoriatic arthritis flares.   She states her left knee joint pain and swelling has resolved.  She states the cortisone injection resolved her discomfort.  She has been walking for exercise.  She denies any achilles tendonitis or plantar fasciitis.  She denies any SI joint pain.  She noticed a small patch of psoriasis on her elbows and is using topical agent which are improving the patches.   She rates her arthritis a 3/10.   Review of Systems  Constitutional: Negative for fever and malaise/fatigue.  Eyes: Negative for photophobia, pain, discharge and redness.  Respiratory: Negative for cough, shortness of breath and wheezing.   Cardiovascular: Negative for chest pain  and palpitations.  Gastrointestinal: Negative for blood in stool, constipation and diarrhea.  Genitourinary: Negative for dysuria.  Musculoskeletal: Negative for back pain, joint pain, myalgias and neck pain.  Skin: Positive for rash (Psoriasis).  Neurological: Negative for dizziness and headaches.  Psychiatric/Behavioral: Negative for depression. The patient is not nervous/anxious and does not have insomnia.       Observations/Objective: Physical Exam  Constitutional: She is oriented to person, place, and time.  Neurological: She is alert and oriented to person, place, and time.  Psychiatric: Mood, memory, affect and judgment normal.   Patient reports morning stiffness for 0 minutes.   Patient denies nocturnal pain.  Difficulty dressing/grooming: Denies Difficulty climbing stairs: Denies Difficulty getting out of chair: Denies Difficulty using hands for taps, buttons, cutlery, and/or writing: Denies   Assessment and Plan: Visit Diagnoses: Psoriatic arthritis (HCC) -She has not had any recent psoriatic arthritis flares.  She has no joint pain or joint swelling at this time.  She has no morning stiffness.   No achilles tendonitis or plantar fasciitis.  No SI joint pain at this time.  Her left knee joint pain and swelling resolved after the cortisone injection on 04/11/19.  She is clinically doing well on Otezla 30 mg 1 tablet BID.  She will continue on this current treatment regimen.  She does not need a refill at this time.  She was advised to notify us if she develops increased joint pain or joint swelling.  She will follow up in 5 months.   Other psoriasis - She has a few small patches of  psoriasis on the extensor surface of both elbow joints.  She has been applying topical agents which have been improving the patches.   High risk medications (not anticoagulants) long-term use - She is taking Otezla 30 mg BID. CBC, BMP, and hepatic panel were ordered on 06/06/19.    Arthralgia of left  knee - She has no discomfort in the left knee joint at this time.  No joint swelling or warmth.  She had a left knee joint aspiration and cortisone injection on 04/11/19, which resolved her discomfort.   Effusion of left knee joint -Resolved.  She has no left knee joint pain or joint swelling at this time. She had a left knee joint aspiration and cortisone injection on 04/11/19, which resolved her discomfort.  She has been walking for exercise.   Osteopenia of multiple sites -She is taking a calcium and vitamin D supplement daily. Last DEXA 02/14/2019 and results were normal.  Vitamin D deficiency: Future order for vitamin D was placed today. She is taking a vitamin D supplement.   Other medical conditions are listed as follows:   History of anemia   Other insomnia   History of hyperlipidemia   Follow Up Instructions: She will follow up in 5 months    I discussed the assessment and treatment plan with the patient. The patient was provided an opportunity to ask questions and all were answered. The patient agreed with the plan and demonstrated an understanding of the instructions.   The patient was advised to call back or seek an in-person evaluation if the symptoms worsen or if the condition fails to improve as anticipated.  I provided 15 minutes of non-face-to-face time during this encounter.   Bo Merino, MD   Scribed by-  Hazel Sams, PA-C

## 2019-09-04 ENCOUNTER — Telehealth (INDEPENDENT_AMBULATORY_CARE_PROVIDER_SITE_OTHER): Payer: Medicare Other | Admitting: Rheumatology

## 2019-09-04 ENCOUNTER — Other Ambulatory Visit: Payer: Self-pay

## 2019-09-04 ENCOUNTER — Encounter: Payer: Self-pay | Admitting: Rheumatology

## 2019-09-04 DIAGNOSIS — E559 Vitamin D deficiency, unspecified: Secondary | ICD-10-CM

## 2019-09-04 DIAGNOSIS — L408 Other psoriasis: Secondary | ICD-10-CM | POA: Diagnosis not present

## 2019-09-04 DIAGNOSIS — M1712 Unilateral primary osteoarthritis, left knee: Secondary | ICD-10-CM | POA: Diagnosis not present

## 2019-09-04 DIAGNOSIS — Z79899 Other long term (current) drug therapy: Secondary | ICD-10-CM | POA: Diagnosis not present

## 2019-09-04 DIAGNOSIS — M25462 Effusion, left knee: Secondary | ICD-10-CM

## 2019-09-04 DIAGNOSIS — G4709 Other insomnia: Secondary | ICD-10-CM

## 2019-09-04 DIAGNOSIS — M8589 Other specified disorders of bone density and structure, multiple sites: Secondary | ICD-10-CM

## 2019-09-04 DIAGNOSIS — L4050 Arthropathic psoriasis, unspecified: Secondary | ICD-10-CM

## 2019-09-04 DIAGNOSIS — Z8639 Personal history of other endocrine, nutritional and metabolic disease: Secondary | ICD-10-CM

## 2019-09-04 DIAGNOSIS — Z862 Personal history of diseases of the blood and blood-forming organs and certain disorders involving the immune mechanism: Secondary | ICD-10-CM

## 2019-09-05 ENCOUNTER — Ambulatory Visit: Payer: Medicare Other | Admitting: Rheumatology

## 2019-09-11 ENCOUNTER — Other Ambulatory Visit: Payer: Self-pay

## 2019-09-11 DIAGNOSIS — E559 Vitamin D deficiency, unspecified: Secondary | ICD-10-CM

## 2019-09-12 LAB — VITAMIN D 25 HYDROXY (VIT D DEFICIENCY, FRACTURES): Vit D, 25-Hydroxy: 34.7 ng/mL (ref 30.0–100.0)

## 2019-09-12 NOTE — Progress Notes (Signed)
Vitamin D is WNL. Please recommend a maintenance dose of vitamin D.

## 2019-09-18 ENCOUNTER — Telehealth: Payer: Self-pay | Admitting: Rheumatology

## 2019-09-18 NOTE — Telephone Encounter (Signed)
-----   Message from Shona Needles, RT sent at 09/05/2019 11:20 AM EST ----- Regarding: IN OFFICE VISIT IN 5 MONTHS

## 2019-09-18 NOTE — Telephone Encounter (Signed)
I LMOM for patient to call, and schedule a follow up appt around 02/09/2020.

## 2019-10-07 ENCOUNTER — Other Ambulatory Visit: Payer: Self-pay | Admitting: Family Medicine

## 2019-10-31 ENCOUNTER — Telehealth: Payer: Self-pay | Admitting: Family Medicine

## 2019-10-31 NOTE — Telephone Encounter (Signed)
Nurses Please talk with patient if she does need to do TB testing please offer it to her accordingly

## 2019-10-31 NOTE — Telephone Encounter (Signed)
Medical evaluation dropped off by pt for work. Form placed in nurse box at nurse station.

## 2019-10-31 NOTE — Telephone Encounter (Signed)
Form in dr scott's folder 

## 2019-11-01 NOTE — Telephone Encounter (Signed)
Pt would like to pick up form at nurse visit on 11/06/2019

## 2019-11-01 NOTE — Telephone Encounter (Signed)
Pt is on schedule for 11/06/2019 to have TB test done.

## 2019-11-01 NOTE — Telephone Encounter (Signed)
Form filled out and is at nurses station. Left message to return call

## 2019-11-05 ENCOUNTER — Telehealth: Payer: Self-pay | Admitting: Rheumatology

## 2019-11-05 NOTE — Telephone Encounter (Signed)
Patient called stating the top part of her back is beginning to "curve outward."  Patient would like to slow the growth and is asking if there is anything Dr. Corliss Skains would recommend.

## 2019-11-05 NOTE — Telephone Encounter (Signed)
Patient states she has noticed the top part of her back is "bowing out." Patient would like to know if there are any exercises she can do to slow it down. Patient states she has noticed this for the last year. Please advise.

## 2019-11-06 ENCOUNTER — Other Ambulatory Visit: Payer: Self-pay

## 2019-11-06 ENCOUNTER — Other Ambulatory Visit (INDEPENDENT_AMBULATORY_CARE_PROVIDER_SITE_OTHER): Payer: Medicare Other | Admitting: *Deleted

## 2019-11-06 ENCOUNTER — Other Ambulatory Visit: Payer: Self-pay | Admitting: *Deleted

## 2019-11-06 DIAGNOSIS — M8589 Other specified disorders of bone density and structure, multiple sites: Secondary | ICD-10-CM

## 2019-11-06 DIAGNOSIS — Z111 Encounter for screening for respiratory tuberculosis: Secondary | ICD-10-CM | POA: Diagnosis not present

## 2019-11-06 NOTE — Telephone Encounter (Signed)
Patient advised Dr. Corliss Skains reviewed patient's bone density from 2019 which was normal.  It is probably postural.  She may benefit from physical therapy. Patient is in agreement with physical therapy. Referral placed.

## 2019-11-06 NOTE — Telephone Encounter (Signed)
I reviewed patient's bone density from 2019 which was normal.  It is probably postural.  She may benefit from physical therapy.  If she does not want to go for physical therapy during the pandemic we can send her a handout on back exercises.

## 2019-11-06 NOTE — Telephone Encounter (Signed)
Attempted to contact the patient and left message for patient to call the office.  

## 2019-11-08 LAB — TB SKIN TEST
Induration: 0 mm
TB Skin Test: NEGATIVE

## 2019-11-09 ENCOUNTER — Ambulatory Visit (HOSPITAL_COMMUNITY): Payer: Medicare Other | Attending: Rheumatology

## 2019-11-09 ENCOUNTER — Other Ambulatory Visit: Payer: Self-pay

## 2019-11-09 ENCOUNTER — Encounter (HOSPITAL_COMMUNITY): Payer: Self-pay

## 2019-11-09 DIAGNOSIS — R29898 Other symptoms and signs involving the musculoskeletal system: Secondary | ICD-10-CM | POA: Diagnosis present

## 2019-11-09 NOTE — Therapy (Signed)
[facility] [address] [place], [place], [address] Phone: [phone]   Fax:  [phone]  Occupational Therapy Evaluation  Patient Details  Name: [name] MRN: [id number] Date of Birth: 23-Apr-1946 Referring Provider (OT): Pollyann Savoy, MD   Encounter Date: 11/09/2019  OT End of Session - 11/09/19 0906    Visit Number  1    Number of Visits  1    Authorization Type  [employer] medicare    Authorization Time Period  co pay $40    OT Start Time  0818    OT Stop Time  0900    OT Time Calculation (min)  42 min    Activity Tolerance  Patient tolerated treatment well    Behavior During Therapy  [facility] for tasks assessed/performed       Past Medical History:  Diagnosis Date  . Hx of colonic polyps   . Hypercholesterolemia   . Psoriatic arthritis [facility])     Past Surgical History:  Procedure Laterality Date  . ABDOMINAL HYSTERECTOMY    . COLONOSCOPY  08/25/2011   Procedure: COLONOSCOPY;  Surgeon: Malissa Hippo, MD;  Location: [facility];  Service: Endoscopy;  Laterality: N/A;  12:00  . COLONOSCOPY N/A 09/21/2018   Procedure: COLONOSCOPY;  Surgeon: Malissa Hippo, MD;  Location: [facility];  Service: Endoscopy;  Laterality: N/A;  830  . left knee stable out  1966  . left knee staple in  1962  . VESICOVAGINAL FISTULA CLOSURE W/ TAH  1986    There were no vitals filed for this visit.     [facility] OT Assessment - 11/09/19 0825      Assessment   Medical Diagnosis  Osteopenia multiple sites - rounded back    Referring Provider (OT)  Pollyann Savoy, MD    Onset Date/Surgical Date  --   approximately 2 years    Hand Dominance  Right    Next MD Visit  N/A    Prior Therapy  None for this condition      Precautions   Precautions  None      Restrictions   Weight Bearing Restrictions  No      Balance Screen   Has the patient fallen in the past 6 months  No      Home  Environment   Family/patient expects to be  discharged to:  Private residence      Prior Function   Level of Independence  Independent    Vocation  Retired      ADL   ADL comments  Pt reports no difficulty completing any daily tasks.       Mobility   Mobility Status  Independent      Written Expression   Dominant Hand  Right      Vision - History   Baseline Vision  Wears glasses all the time      Cognition   Overall Cognitive Status  Within Functional Limits for tasks assessed      Posture/Postural Control   Posture/Postural Control  Postural limitations    Postural Limitations  Rounded Shoulders;Forward head;Increased lumbar lordosis;Posterior pelvic tilt      ROM / Strength   AROM / PROM / Strength  AROM;Strength      AROM   Overall AROM   Within functional limits for tasks performed    Overall AROM Comments  BUE shoulders assessed in seated      Strength   Overall Strength Comments  Assessed seated. IR/er adducted    Strength Assessment Site  Shoulder    Right/Left Shoulder  Left;Right    Right Shoulder Flexion  5/5    Right Shoulder ABduction  5/5    Right Shoulder Internal Rotation  5/5    Right Shoulder External Rotation  5/5    Left Shoulder Flexion  5/5    Left Shoulder ABduction  5/5    Left Shoulder Internal Rotation  5/5    Left Shoulder External Rotation  5/5               OT Treatments/Exercises (OP) - 11/09/19 0903      Exercises   Exercises  Shoulder;Neck      Neck Exercises: Standing   Other Standing Exercises  wall posture hold; 20 seconds; 2 times. Triangle to W position standing against wall; W to Y position standing against wall.       Shoulder Exercises: Standing   Extension  Theraband;10 reps    Theraband Level (Shoulder Extension)  Level 3 (Green)    Row  Theraband;10 reps    Theraband Level (Shoulder Row)  Level 3 (Green)    Retraction  Theraband;10 reps    Theraband Level (Shoulder Retraction)  Level 3 Nyoka Cowden)            OT Education - 11/09/19 0905     Education Details  postural education, standing and seated body positioning, green theraband scapular strengthening, scapular/neck  A/ROM mobility    Person(s) Educated  Patient    Methods  Explanation;Demonstration;Verbal cues;Handout;Tactile cues    Comprehension  Returned demonstration;Verbalized understanding       OT Short Term Goals - 11/09/19 0913      OT SHORT TERM GOAL #1   Title  Patient will be educated and demonstrate/verbalize understanding of HEP and postural education for carry over at home and work towards increasing her overall posture when seated and standing.    Time  1    Period  Days    Status  Achieved    Target Date  11/09/19               Plan - 11/09/19 6378    Clinical Impression Statement  A: Patient is a 74 y/o female presenting with osteopenia in multiple sites with complaints of a rounded thoracic spine resulting in poor posture and muscle imbalance.    OT Occupational Profile and History  Problem Focused Assessment - Including review of records relating to presenting problem    Occupational performance deficits (Please refer to evaluation for details):  Leisure    Body Structure / Function / Physical Skills  Strength;Mobility;Flexibility;Body mechanics    Rehab Potential  Excellent    Clinical Decision Making  Limited treatment options, no task modification necessary    Comorbidities Affecting Occupational Performance:  May have comorbidities impacting occupational performance    Modification or Assistance to Complete Evaluation   No modification of tasks or assist necessary to complete eval    OT Frequency  One time visit    OT Treatment/Interventions  Patient/family education    Plan  P: 1 time visit for HEP as patient is not demonstrating any difficulty with ADL and daily function. Recommend completing HEP independently. If no change after 4-6 weeks of completing HEP, patient may follow up with OT for re-evaluation.    Consulted and Agree with  Plan of Care  Patient       Patient will benefit from skilled therapeutic intervention in  order to improve the following deficits and impairments:   Body Structure / Function / Physical Skills: Strength, Mobility, Flexibility, Body mechanics       Visit Diagnosis: Other symptoms and signs involving the musculoskeletal system - Plan: Ot plan of care cert/re-cert    Problem List Patient Active Problem List   Diagnosis Date Noted  . History of colonic polyps 07/04/2018  . History of hyperlipidemia 02/09/2017  . Other psoriasis 12/30/2016  . High risk medications (not anticoagulants) long-term use 12/30/2016  . Psoriatic arthritis (HCC) 12/27/2014  . Vitamin D deficiency 08/06/2013  . Osteopenia 08/06/2013  . Insomnia 02/22/2013  . Hyperlipidemia 01/09/2013  . Anemia 09/12/2012  . Toe cyanosis 08/08/2012  . Osteoarthritis of shoulder 09/15/2010   Limmie Patricia, OTR/L,CBIS  [phone]  11/09/2019, 9:16 AM  [facility] [address] [place], [place], [address] Phone: [phone]   Fax:  [phone]  Name: [name] MRN: [id number] Date of Birth: 02/25/1946

## 2019-11-09 NOTE — Patient Instructions (Signed)
Complete the following exercises/stretches at least 2 times a day as able.  PROPER CERVICAL AND SPINAL POSTURE - SEATED  Good posture positions your head over your shoulders so that your head is not protruding forward. Your ears should be over your shoulders.   Begin by correcting your low back so that it is not slouched. This will correct much of the spine. You may also need to perform a small chin tuck as well.    The image on the right shows how you should position your head and spine throughout the day. This might be difficult at first but over time will get easier as your body adjusts.     WALL POSTURE  Stand with your heels up against a wall.   Attempt to get your heels, buttock, shoulders and head to touch the wall at the same time. Hold this position for 20 seconds. Complete twice.        Triangle to W Postural Exercise   Start by standing against a wall with a rolled towel behind your head. Hold your hands with arms extended in front of your chest creating a triangle as shown in the picture. While maintaining a chin tuck, slowly draw your arms back to a "W" position as you squeeze your shoulder blades back with elbows as close as you can to your body.  Slowly return to the starting position and repeat.    10-15 repetitions.    W to Y Postural Exercise  Start by standing against a wall with a rolled towel behind your head.  Perform a chin tuck and bring your arms up into a "W" position with your shoulder blades squeezed. Next, raise your arms slowly into a "Y" position and then slowly back down to a "W" position. Make sure you keep your chin tucked and shoulder blades squeezed the entire time.    Complete 10-15 repetitions.    Postural Education: Cervical Retraction+Scap Squeeze  With good posture, draw your shoulder blades back and down while performing chin tuck/cervical retraction.  Hold for 5 seconds. Complete 5 times.  Try to check yourself for this  posture/positioning throughout your day, especially while sitting at work, driving, using computer, watching tv, etc.  Use this exercise to re-set posture as well as exercise to address neck pain/tightness.    (Home) Extension: Isometric / Bilateral Arm Retraction - Sitting   Facing anchor, hold hands and elbow at shoulder height, with elbow bent.  Pull arms back to squeeze shoulder blades together. Repeat 10-15 times. 1-3 times/day.   (Clinic) Extension / Flexion (Assist)   Face anchor, pull arms back, keeping elbow straight, and squeze shoulder blades together. Repeat 10-15 times. 1-3 times/day.   Copyright  VHI. All rights reserved.   (Home) Retraction: Row - Bilateral (Anchor)   Facing anchor, arms reaching forward, pull hands toward stomach, keeping elbows bent and at your sides and pinching shoulder blades together. Repeat 10-15 times. 1-3 times/day.   Copyright  VHI. All rights reserved.

## 2019-11-12 ENCOUNTER — Telehealth: Payer: Self-pay | Admitting: Family Medicine

## 2019-11-12 DIAGNOSIS — Z79899 Other long term (current) drug therapy: Secondary | ICD-10-CM

## 2019-11-12 DIAGNOSIS — D649 Anemia, unspecified: Secondary | ICD-10-CM

## 2019-11-12 DIAGNOSIS — E785 Hyperlipidemia, unspecified: Secondary | ICD-10-CM

## 2019-11-12 DIAGNOSIS — I10 Essential (primary) hypertension: Secondary | ICD-10-CM

## 2019-11-12 NOTE — Telephone Encounter (Signed)
Recommend lipid, liver, CBC, ferritin Iron deficient anemia, hyperlipidemia

## 2019-11-12 NOTE — Telephone Encounter (Signed)
Last labs 06/2019:  CBC, Ferritin, TSH, Met 7. Lipid and Liver

## 2019-11-12 NOTE — Telephone Encounter (Signed)
Patient has 6 month follow up on 3/11 and requesting labs

## 2019-11-12 NOTE — Telephone Encounter (Signed)
Blood work ordered in Epic. Patient notified. 

## 2019-11-30 NOTE — Telephone Encounter (Signed)
Left message for patient to follow up on [facility] Patient Assistance. Provider portion has been submitted.  12:56 PM Dorthula Nettles, CPhT

## 2019-12-13 ENCOUNTER — Ambulatory Visit: Payer: Medicare Other | Admitting: Family Medicine

## 2019-12-13 LAB — CBC WITH DIFFERENTIAL/PLATELET
Basophils Absolute: 0 10*3/uL (ref 0.0–0.2)
Basos: 1 %
EOS (ABSOLUTE): 0.1 10*3/uL (ref 0.0–0.4)
Eos: 1 %
Hematocrit: 36.6 % (ref 34.0–46.6)
Hemoglobin: 11.7 g/dL (ref 11.1–15.9)
Immature Grans (Abs): 0 10*3/uL (ref 0.0–0.1)
Immature Granulocytes: 0 %
Lymphocytes Absolute: 2.4 10*3/uL (ref 0.7–3.1)
Lymphs: 48 %
MCH: 24 pg — ABNORMAL LOW (ref 26.6–33.0)
MCHC: 32 g/dL (ref 31.5–35.7)
MCV: 75 fL — ABNORMAL LOW (ref 79–97)
Monocytes Absolute: 0.4 10*3/uL (ref 0.1–0.9)
Monocytes: 7 %
Neutrophils Absolute: 2.2 10*3/uL (ref 1.4–7.0)
Neutrophils: 43 %
Platelets: 220 10*3/uL (ref 150–450)
RBC: 4.88 x10E6/uL (ref 3.77–5.28)
RDW: 14.9 % (ref 11.7–15.4)
WBC: 5 10*3/uL (ref 3.4–10.8)

## 2019-12-13 LAB — LIPID PANEL
Chol/HDL Ratio: 2.2 ratio (ref 0.0–4.4)
Cholesterol, Total: 186 mg/dL (ref 100–199)
HDL: 86 mg/dL (ref >39)
LDL Chol Calc (NIH): 89 mg/dL (ref 0–99)
Triglycerides: 55 mg/dL (ref 0–149)
VLDL Cholesterol Cal: 11 mg/dL (ref 5–40)

## 2019-12-13 LAB — HEPATIC FUNCTION PANEL
ALT: 13 IU/L (ref 0–32)
AST: 25 IU/L (ref 0–40)
Albumin: 4.5 g/dL (ref 3.7–4.7)
Alkaline Phosphatase: 105 IU/L (ref 39–117)
Bilirubin Total: 0.2 mg/dL (ref 0.0–1.2)
Bilirubin, Direct: 0.09 mg/dL (ref 0.00–0.40)
Total Protein: 7.2 g/dL (ref 6.0–8.5)

## 2019-12-13 LAB — FERRITIN: Ferritin: 23 ng/mL (ref 15–150)

## 2019-12-14 ENCOUNTER — Encounter: Payer: Self-pay | Admitting: Family Medicine

## 2019-12-14 ENCOUNTER — Ambulatory Visit (INDEPENDENT_AMBULATORY_CARE_PROVIDER_SITE_OTHER): Payer: Medicare Other | Admitting: Family Medicine

## 2019-12-14 ENCOUNTER — Other Ambulatory Visit: Payer: Self-pay

## 2019-12-14 DIAGNOSIS — E785 Hyperlipidemia, unspecified: Secondary | ICD-10-CM

## 2019-12-14 MED ORDER — ROSUVASTATIN CALCIUM 5 MG PO TABS: 5.0000 mg | ORAL_TABLET | Freq: Every day | ORAL | 1 refills | Status: DC

## 2019-12-14 NOTE — Progress Notes (Signed)
Subjective:    Patient ID: [name], female    DOB: 11/23/1945, 74 y.o.   MRN: [id number]  Hyperlipidemia This is a chronic problem. The current episode started more than 1 year ago. Treatments tried: crestor. There are no compliance problems.  Risk factors for coronary artery disease include dyslipidemia.  Patient with hyperlipidemia She tolerates her cholesterol medicine well Virtual Visit via Video Note  I connected with [name] on 12/14/19 at 11:00 AM EST by a video enabled telemedicine application and verified that I am speaking with the correct person using two identifiers.  Location: Patient: home Provider: office   I discussed the limitations of evaluation and management by telemedicine and the availability of in person appointments. The patient expressed understanding and agreed to proceed.  History of Present Illness:    Observations/Objective:   Assessment and Plan:   Follow Up Instructions:    I discussed the assessment and treatment plan with the patient. The patient was provided an opportunity to ask questions and all were answered. The patient agreed with the plan and demonstrated an understanding of the instructions.   The patient was advised to call back or seek an in-person evaluation if the symptoms worsen or if the condition fails to improve as anticipated.  I provided 20 minutes of non-face-to-face time during this encounter.  Results for orders placed or performed in visit on 11/12/19  Lipid panel  Result Value Ref Range   Cholesterol, Total 186 100 - 199 mg/dL   Triglycerides 55 0 - 149 mg/dL   HDL 86 >09 mg/dL   VLDL Cholesterol Cal 11 5 - 40 mg/dL   LDL Chol Calc (NIH) 89 0 - 99 mg/dL   Chol/HDL Ratio 2.2 0.0 - 4.4 ratio  Hepatic function panel  Result Value Ref Range   Total Protein 7.2 6.0 - 8.5 g/dL   Albumin 4.5 3.7 - 4.7 g/dL   Bilirubin Total 0.2 0.0 - 1.2 mg/dL   Bilirubin, Direct 3.23 0.00 - 0.40 mg/dL   Alkaline Phosphatase 105 39 - 117 IU/L   AST 25 0 - 40 IU/L   ALT 13 0 - 32 IU/L  CBC with Differential/Platelet  Result Value Ref Range   WBC 5.0 3.4 - 10.8 x10E3/uL   RBC 4.88 3.77 - 5.28 x10E6/uL   Hemoglobin 11.7 11.1 - 15.9 g/dL   Hematocrit 55.7 32.2 - 46.6 %   MCV 75 (L) 79 - 97 fL   MCH 24.0 (L) 26.6 - 33.0 pg   MCHC 32.0 31.5 - 35.7 g/dL   RDW 02.5 42.7 - 06.2 %   Platelets 220 150 - 450 x10E3/uL   Neutrophils 43 Not Estab. %   Lymphs 48 Not Estab. %   Monocytes 7 Not Estab. %   Eos 1 Not Estab. %   Basos 1 Not Estab. %   Neutrophils Absolute 2.2 1.4 - 7.0 x10E3/uL   Lymphocytes Absolute 2.4 0.7 - 3.1 x10E3/uL   Monocytes Absolute 0.4 0.1 - 0.9 x10E3/uL   EOS (ABSOLUTE) 0.1 0.0 - 0.4 x10E3/uL   Basophils Absolute 0.0 0.0 - 0.2 x10E3/uL   Immature Granulocytes 0 Not Estab. %   Immature Grans (Abs) 0.0 0.0 - 0.1 x10E3/uL  Ferritin  Result Value Ref Range   Ferritin 23 15 - 150 ng/mL      Review of Systems     Objective:   Physical Exam   Today's visit was via telephone Physical exam was not possible for this visit  Assessment & Plan:  Hyperlipidemia Medications were reviewed Refills given Warning signs discussed Patient with some depression symptoms with the pandemic but she feels things are get better this spring if not she will let us know I doubt it is due to her [place] currently Warning signs were discussed Overall doing well

## 2019-12-18 ENCOUNTER — Encounter (HOSPITAL_COMMUNITY): Payer: Self-pay

## 2019-12-18 ENCOUNTER — Ambulatory Visit (HOSPITAL_COMMUNITY): Payer: Medicare Other | Attending: Rheumatology

## 2019-12-18 ENCOUNTER — Other Ambulatory Visit: Payer: Self-pay

## 2019-12-18 DIAGNOSIS — R29898 Other symptoms and signs involving the musculoskeletal system: Secondary | ICD-10-CM | POA: Diagnosis present

## 2019-12-18 NOTE — Therapy (Signed)
[facility] [address] [place], [place], [address] Phone: [phone]   Fax:  [phone]  Occupational Therapy Re-Evaluation  Patient Details  Name: [name] MRN: [id number] Date of Birth: April 07, 1946 Referring Provider (OT): Pollyann Savoy, MD   Encounter Date: 12/18/2019  OT End of Session - 12/18/19 0932    Visit Number  1    Number of Visits  1    Authorization Type  [employer] medicare    Authorization Time Period  co pay $40    OT Start Time  0900    OT Stop Time  0921    OT Time Calculation (min)  21 min    Activity Tolerance  Patient tolerated treatment well    Behavior During Therapy  [facility] for tasks assessed/performed       Past Medical History:  Diagnosis Date  . Hx of colonic polyps   . Hypercholesterolemia   . Psoriatic arthritis [facility])     Past Surgical History:  Procedure Laterality Date  . ABDOMINAL HYSTERECTOMY    . COLONOSCOPY  08/25/2011   Procedure: COLONOSCOPY;  Surgeon: Malissa Hippo, MD;  [facility];  Service: Endoscopy;  Laterality: N/A;  12:00  . COLONOSCOPY N/A 09/21/2018   Procedure: COLONOSCOPY;  Surgeon: Malissa Hippo, MD;  [facility];  Service: Endoscopy;  Laterality: N/A;  830  . left knee stable out  1966  . left knee staple in  1962  . VESICOVAGINAL FISTULA CLOSURE W/ TAH  1986    There were no vitals filed for this visit.  Subjective Assessment - 12/18/19 0926    Subjective   S: I was looking online and saw some other exercises that I wanted to run by you.    Pertinent History  Patient is a 74 y/o female returning to the clinic for a re-evaluation to assess her HEP and follow up since initial evaluation for postural issues related to osteopenia in multiple sites.    Patient Stated Goals  To check HEP and ask questions regarding exercises.    Currently in Pain?  No/denies        [facility] OT Assessment - 12/18/19 6962      Assessment   Medical  Diagnosis  Osteopenia multiple sites - rounded back    Referring Provider (OT)  Pollyann Savoy, MD    Onset Date/Surgical Date  --   approximately 2 years ago   Hand Dominance  Right    Next MD Visit  N/A    Prior Therapy  Patient has an OT evaluation on 11/09/19 to establish a HEP.      Precautions   Precautions  None      Restrictions   Weight Bearing Restrictions  No      Balance Screen   Has the patient fallen in the past 6 months  No      Home  Environment   Family/patient expects to be discharged to:  Private residence      Prior Function   Level of Independence  Independent    Vocation  Retired      ADL   ADL comments  Pt reports no difficulty completing any daily tasks.       Mobility   Mobility Status  Independent      Written Expression   Dominant Hand  Right      Vision - History   Baseline Vision  Wears glasses all the time  Cognition   Overall Cognitive Status  Within Functional Limits for tasks assessed      Posture/Postural Control   Posture Comments  Improvement noted in standing and seated posture. No rounded shoulders, forward head, posterior pelvic tilt or increased lordosis      ROM / Strength   AROM / PROM / Strength  AROM;Strength      AROM   Overall AROM Comments  BUE shoulders were assessed on 11/09/19. A/ROM WNL      Strength   Overall Strength Comments  Assessed at eval on 11/09/19/ BUE shoulder strength: 5/5 in all ranges.                       OT Education - 12/18/19 0930    Education Details  Reviewed HEP. Pt educated that the best exercises are her posture are the 3 scapular strengthening ones with her band. She can also supplement core exercises by taking yoga classes or using Youtube exercise videos. Discouraged use of posture brace/bra as it won't strengthening any muscles and does the work for her. Patient verbalized understanding of all education and recommendations.    Person(s) Educated  Patient    Methods   Explanation    Comprehension  Verbalized understanding       OT Short Term Goals - 12/18/19 0935      OT SHORT TERM GOAL #1   Title  Patient will be educated and verbalize understanding of HEP and postural education for carry over at home and work towards increasing her overall posture when seated and standing.    Time  1    Period  Days    Status  Achieved    Target Date  12/18/19               Plan - 12/18/19 0932    Clinical Impression Statement  A: Patient is a 74 y/o female presenting with osteopenia in multiple sites. Previously evaled January 2021 with complaints of a rounded thoracic spine resulting in poor posture and muscle imbalance. HEP was provided at initial evaluation and patien thas been completing independently. Patient requested to return to clinic for follow up and to answer questions regarding HEP and any additional exercises. All education completed and questions answered this date. [facility] [place] informed that [facility] may also answer questions regarding exercises when she is there.    OT Occupational Profile and History  Problem Focused Assessment - Including review of records relating to presenting problem    Occupational performance deficits (Please refer to evaluation for details):  Leisure    Body Structure / Function / Physical Skills  Strength;Mobility;Flexibility;Body mechanics    Rehab Potential  Excellent    Clinical Decision Making  Limited treatment options, no task modification necessary    Comorbidities Affecting Occupational Performance:  May have comorbidities impacting occupational performance    Modification or Assistance to Complete Evaluation   No modification of tasks or assist necessary to complete eval    OT Frequency  One time visit    OT Treatment/Interventions  Patient/family education    Plan  P: 1 time visit for re-evaluation. HEP reviewed and patient questions answered.  No follow up OT needs at this time.    Consulted and Agree with Plan of Care  Patient       Patient will benefit from skilled therapeutic intervention in order to improve the following  deficits and impairments:   Body Structure / Function / Physical Skills: Strength, Mobility, Flexibility, Body mechanics       Visit Diagnosis: Other symptoms and signs involving the musculoskeletal system - Plan: Ot plan of care cert/re-cert    Problem List Patient Active Problem List   Diagnosis Date Noted  . History of colonic polyps 07/04/2018  . History of hyperlipidemia 02/09/2017  . Other psoriasis 12/30/2016  . High risk medications (not anticoagulants) long-term use 12/30/2016  . Psoriatic arthritis (HCC) 12/27/2014  . Vitamin D deficiency 08/06/2013  . Osteopenia 08/06/2013  . Insomnia 02/22/2013  . Hyperlipidemia 01/09/2013  . Anemia 09/12/2012  . Toe cyanosis 08/08/2012  . Osteoarthritis of shoulder 09/15/2010   Limmie Patricia, OTR/L,CBIS  [phone]  12/18/2019, 9:38 AM  [facility] [facility] [address] [place], [place], [address] Phone: [phone]   Fax:  [phone]  Name: [name] MRN: [id number] Date of Birth: 24-Aug-1946

## 2019-12-19 NOTE — Telephone Encounter (Signed)
Received fax from [employer] through [employer], patient's renewal application has been APPROVED. Coverage dates are from 12/18/19 to 10/03/20.   Will send documents to [facility].   Phone# [phone] Fax# [phone]  1:12 PM Dorthula Nettles, CPhT

## 2020-02-01 NOTE — Progress Notes (Signed)
Office Visit Note  Patient: [name]             Date of Birth: 04-Aug-1946           MRN: [id number]             PCP: Babs Sciara, MD Referring: Babs Sciara, MD Visit Date: 02/08/2020 Occupation: @GUAROCC @  Subjective:  Medication monitoring   History of Present Illness: [name] is a 74 y.o. female with history of psoriatic arthritis.  Patient is taking Otezla 30 mg 1 tablet twice daily.  She denies any recent psoriatic arthritis flares.  She denies any joint pain or joint swelling at this time.  She has been experiencing some increased morning stiffness the last several days which she attributes to working in her garden on a daily basis.  She denies any Achilles denies or plantar fasciitis.  She denies any SI joint pain.  She denies any active psoriasis.  Activities of Daily Living:  Patient reports morning stiffness for a few minutes.   Patient Denies nocturnal pain.  Difficulty dressing/grooming: Denies Difficulty climbing stairs: Denies Difficulty getting out of chair: Denies Difficulty using hands for taps, buttons, cutlery, and/or writing: Denies  Review of Systems  Constitutional: Negative for fatigue.  HENT: Negative for mouth sores, mouth dryness and nose dryness.   Eyes: Negative for itching and dryness.  Respiratory: Negative for shortness of breath and difficulty breathing.   Cardiovascular: Negative for chest pain and palpitations.  Gastrointestinal: Negative for blood in stool, constipation and diarrhea.  Endocrine: Negative for increased urination.  Genitourinary: Negative for difficulty urinating and painful urination.  Musculoskeletal: Positive for morning stiffness. Negative for arthralgias, joint pain, joint swelling, myalgias, muscle tenderness and myalgias.  Skin: Negative for rash and redness.  Allergic/Immunologic: Negative for susceptible to infections.  Neurological: Negative for dizziness, numbness, headaches, memory loss  and weakness.  Hematological: Negative for bruising/bleeding tendency.  Psychiatric/Behavioral: Positive for sleep disturbance. Negative for confusion.    PMFS History:  Patient Active Problem List   Diagnosis Date Noted  . History of colonic polyps 07/04/2018  . History of hyperlipidemia 02/09/2017  . Other psoriasis 12/30/2016  . High risk medications (not anticoagulants) long-term use 12/30/2016  . Psoriatic arthritis (HCC) 12/27/2014  . Vitamin D deficiency 08/06/2013  . Osteopenia 08/06/2013  . Insomnia 02/22/2013  . Hyperlipidemia 01/09/2013  . Anemia 09/12/2012  . Toe cyanosis 08/08/2012  . Osteoarthritis of shoulder 09/15/2010    Past Medical History:  Diagnosis Date  . Hx of colonic polyps   . Hypercholesterolemia   . Psoriatic arthritis (HCC)     Family History  Problem Relation Age of Onset  . Anesthesia problems Mother   . Cancer Mother   . Hypertension Mother   . Breast cancer Mother   . Cancer Father   . Diabetes Father   . Deep vein thrombosis Father   . Hypertension Father   . Arthritis Other        FH  . Diabetes Other        FH  . Cancer Other        FH  . Hypertension Sister   . Hypertension Brother   . Heart disease Brother   . Migraines Sister   . Migraines Sister   . Hypotension Neg Hx   . Malignant hyperthermia Neg Hx   . Pseudochol deficiency Neg Hx    Past Surgical History:  Procedure Laterality Date  . ABDOMINAL HYSTERECTOMY    .  COLONOSCOPY  08/25/2011   Procedure: COLONOSCOPY;  Surgeon: Malissa Hippo, MD;  [facility];  Service: Endoscopy;  Laterality: N/A;  12:00  . COLONOSCOPY N/A 09/21/2018   Procedure: COLONOSCOPY;  Surgeon: Malissa Hippo, MD;  [facility];  Service: Endoscopy;  Laterality: N/A;  830  . left knee stable out  1966  . left knee staple in  1962  . VESICOVAGINAL FISTULA CLOSURE W/ TAH  1986   Social History   Social History Narrative  . Not on file   Immunization History    Administered Date(s) Administered  . Influenza,inj,Quad PF,6+ Mos 06/19/2019  . Influenza-Unspecified 07/13/2016, 08/02/2018  . PPD Test 11/06/2019  . Pneumococcal Conjugate-13 12/27/2014  . Pneumococcal Polysaccharide-23 03/30/2016  . Pneumococcal-Unspecified 11/06/2009     Objective: Vital Signs: BP 123/66 (BP Location: Left Arm, Patient Position: Sitting, Cuff Size: Normal)   Pulse (!) 53   Resp 12   Ht 5\' 2"  (1.575 m)   Wt 146 lb 9.6 oz (66.5 kg)   BMI 26.81 kg/m    Physical Exam Vitals and nursing note reviewed.  Constitutional:      Appearance: She is well-developed.  HENT:     Head: Normocephalic and atraumatic.  Eyes:     Conjunctiva/sclera: Conjunctivae normal.  Pulmonary:     Effort: Pulmonary effort is normal.  Abdominal:     General: Bowel sounds are normal.     Palpations: Abdomen is soft.  Musculoskeletal:     Cervical back: Normal range of motion.  Lymphadenopathy:     Cervical: No cervical adenopathy.  Skin:    General: Skin is warm and dry.     Capillary Refill: Capillary refill takes less than 2 seconds.  Neurological:     Mental Status: She is alert and oriented to person, place, and time.  Psychiatric:        Behavior: Behavior normal.      Musculoskeletal Exam: C-spine, thoracic spine, lumbar spine good range of motion.  No midline spinal tenderness.  No SI joint tenderness.  Shoulder joints, elbow joints, wrist joints, MCPs, PIPs and DIPs good range of motion with no synovitis.  She has complete fist formation bilaterally.  Limited range of motion of the right hip joint.  Left hip has good range of motion with no discomfort.  Knee joints have good range of motion with no warmth or effusion.  Ankle joints have good range of motion no tenderness or inflammation.  No Achilles tendinitis or plantar fasciitis.  No tenderness of MTP joints.  CDAI Exam: CDAI Score: -- Patient Global: --; Provider Global: -- Swollen: --; Tender: -- Joint Exam  02/08/2020   No joint exam has been documented for this visit   There is currently no information documented on the homunculus. Go to the Rheumatology activity and complete the homunculus joint exam.  Investigation: No additional findings.  Imaging: No results found.  Recent Labs: Lab Results  Component Value Date   WBC 5.0 12/12/2019   HGB 11.7 12/12/2019   PLT 220 12/12/2019   NA 141 06/06/2019   K 4.1 06/06/2019   CL 105 06/06/2019   CO2 20 06/06/2019   GLUCOSE 82 06/06/2019   BUN 7 (L) 06/06/2019   CREATININE 0.79 06/06/2019   BILITOT 0.2 12/12/2019   ALKPHOS 105 12/12/2019   AST 25 12/12/2019   ALT 13 12/12/2019   PROT 7.2 12/12/2019   ALBUMIN 4.5 12/12/2019   CALCIUM 9.5 06/06/2019   GFRAA 86  06/06/2019    Speciality Comments: No specialty comments available.  Procedures:  No procedures performed Allergies: Etodolac   Assessment / Plan:     Visit Diagnoses: Psoriatic arthritis ([place]): She has no synovitis or dactylitis on exam.  She has not had any recent psoriatic arthritis flares.  She is clinically doing well on Otezla 30 mg 1 tablet twice daily.  She is tolerating Otezla without any side effects.  She is not experiencing any joint pain or joint swelling at this time.  No Achilles denies or plantar fasciitis.  No SI joint tenderness on exam.  She has no active psoriasis at this time.  She has been working in her garden on a daily basis this week and has noticed an increase morning stiffness which typically resolves within several minutes.  She will continue taking Otezla 30 mg 1 tablet by mouth twice daily.  She does not need a refill at this time.  She was advised to notify us if she develops increased joint pain or joint swelling.  She will follow-up in the office in 5 months.  Other psoriasis: She has no psoriasis at this time.   High risk medications (not anticoagulants) long-term use - Otezla 30 mg BID  Osteopenia of multiple sites - Last DEXA 02/14/2019  and results were normal.  Vitamin D deficiency - She is taking vitamin D on a daily basis.  She requested to have her vitamin D level checked today. Plan: VITAMIN D 25 Hydroxy (Vit-D Deficiency, Fractures)  Primary osteoarthritis of left knee: She has good ROM of the left knee joint with no discomfort.  No warmth or effusion noted.   Effusion of left knee joint: Resolved   History of anemia -She requested to have ferritin rechecked and would like the result faxed to her PCP. Plan: Ferritin  Other insomnia: She has been sleeping better at night.   History of hyperlipidemia -She requested to have lipid panel checked today.  Plan: Lipid panel  Orders: Orders Placed This Encounter  Procedures  . VITAMIN D 25 Hydroxy (Vit-D Deficiency, Fractures)  . Lipid panel  . Ferritin   No orders of the defined types were placed in this encounter.     Follow-Up Instructions: Return in about 5 months (around 07/10/2020) for Psoriatic arthritis.   Ofilia Neas, PA-C  Note - This record has been created using Dragon software.  Chart creation errors have been sought, but may not always  have been located. Such creation errors do not reflect on  the standard of medical care.

## 2020-02-08 ENCOUNTER — Other Ambulatory Visit: Payer: Self-pay

## 2020-02-08 ENCOUNTER — Ambulatory Visit: Payer: Medicare Other | Admitting: Physician Assistant

## 2020-02-08 ENCOUNTER — Encounter: Payer: Self-pay | Admitting: Physician Assistant

## 2020-02-08 ENCOUNTER — Telehealth: Payer: Self-pay | Admitting: *Deleted

## 2020-02-08 VITALS — BP 123/66 | HR 53 | Resp 12 | Ht 62.0 in | Wt 146.6 lb

## 2020-02-08 DIAGNOSIS — Z8639 Personal history of other endocrine, nutritional and metabolic disease: Secondary | ICD-10-CM

## 2020-02-08 DIAGNOSIS — M8589 Other specified disorders of bone density and structure, multiple sites: Secondary | ICD-10-CM | POA: Diagnosis not present

## 2020-02-08 DIAGNOSIS — Z79899 Other long term (current) drug therapy: Secondary | ICD-10-CM | POA: Diagnosis not present

## 2020-02-08 DIAGNOSIS — M1712 Unilateral primary osteoarthritis, left knee: Secondary | ICD-10-CM

## 2020-02-08 DIAGNOSIS — G4709 Other insomnia: Secondary | ICD-10-CM

## 2020-02-08 DIAGNOSIS — L4050 Arthropathic psoriasis, unspecified: Secondary | ICD-10-CM | POA: Diagnosis not present

## 2020-02-08 DIAGNOSIS — L408 Other psoriasis: Secondary | ICD-10-CM

## 2020-02-08 DIAGNOSIS — M25462 Effusion, left knee: Secondary | ICD-10-CM

## 2020-02-08 DIAGNOSIS — E559 Vitamin D deficiency, unspecified: Secondary | ICD-10-CM

## 2020-02-08 DIAGNOSIS — Z862 Personal history of diseases of the blood and blood-forming organs and certain disorders involving the immune mechanism: Secondary | ICD-10-CM

## 2020-02-08 NOTE — Telephone Encounter (Signed)
Per Simona Huh -phlebotomist sample for Vitamin D and Ferritin and Lipid Panel is hemolyzed. Patient will need to come back to have redrawn. Advised patient and she will come 02/11/2020. Patient advised to drink plenty of water over the weekend.

## 2020-02-13 LAB — FERRITIN: Ferritin: 18 ng/mL (ref 16–288)

## 2020-02-13 LAB — VITAMIN D 25 HYDROXY (VIT D DEFICIENCY, FRACTURES): Vit D, 25-Hydroxy: 44 ng/mL (ref 30–100)

## 2020-02-13 LAB — LIPID PANEL
Cholesterol: 206 mg/dL — ABNORMAL HIGH (ref <200)
HDL: 82 mg/dL (ref >=50)
LDL Cholesterol (Calc): 110 mg/dL (calc) — ABNORMAL HIGH
Non-HDL Cholesterol (Calc): 124 mg/dL (calc) (ref <130)
Total CHOL/HDL Ratio: 2.5 (calc) (ref <5.0)
Triglycerides: 57 mg/dL (ref <150)

## 2020-02-14 NOTE — Progress Notes (Signed)
Vitamin D is WNL.  Please recommend a maintenance dose of vitamin D.  Ferritin WNL.  Cholesterol borderline elevated-206 and LDL 110.  Please forward lab work to PCP.

## 2020-02-22 ENCOUNTER — Telehealth (INDEPENDENT_AMBULATORY_CARE_PROVIDER_SITE_OTHER): Payer: Medicare Other | Admitting: Nurse Practitioner

## 2020-02-22 ENCOUNTER — Telehealth: Payer: Self-pay | Admitting: *Deleted

## 2020-02-22 ENCOUNTER — Other Ambulatory Visit: Payer: Self-pay

## 2020-02-22 DIAGNOSIS — F5101 Primary insomnia: Secondary | ICD-10-CM

## 2020-02-22 NOTE — Telephone Encounter (Signed)
[name], buist are scheduled for a virtual visit with your provider today.    Just as we do with appointments in the office, we must obtain your consent to participate.  Your consent will be active for this visit and any virtual visit you may have with one of our providers in the next 365 days.    If you have a MyChart account, I can also send a copy of this consent to you electronically.  All virtual visits are billed to your insurance company just like a traditional visit in the office.  As this is a virtual visit, video technology does not allow for your provider to perform a traditional examination.  This may limit your provider's ability to fully assess your condition.  If your provider identifies any concerns that need to be evaluated in person or the need to arrange testing such as labs, EKG, etc, we will make arrangements to do so.    Although advances in technology are sophisticated, we cannot ensure that it will always work on either your end or our end.  If the connection with a video visit is poor, we may have to switch to a telephone visit.  With either a video or telephone visit, we are not always able to ensure that we have a secure connection.   I need to obtain your verbal consent now.   Are you willing to proceed with your visit today?   [place] Dost Caul has provided verbal consent on 02/22/2020 for a virtual visit (video or telephone).   Kathleen Lime, RN 02/22/2020  10:17 AM

## 2020-02-22 NOTE — Progress Notes (Signed)
  PHONE VISIT Subjective:    Patient ID: [name], female    DOB: Apr 17, 1946, 74 y.o.   MRN: [id number]  HPI  Patient calls to discuss insomnia. Patient states she has had trouble sleeping at night for years. Patient states se has been eating and drinking foods high in melatonin.  Review of Systems Virtual Visit via Video Note  I connected with [name] on 02/22/20 at 11:00 AM EDT by a video enabled telemedicine application and verified that I am speaking with the correct person using two identifiers.  Location: Patient: home Provider: office   I discussed the limitations of evaluation and management by telemedicine and the availability of in person appointments. The patient expressed understanding and agreed to proceed.  History of Present Illness: See above. Issues with chronic insomnia. Was taking naps at one point but cut this out a few weeks ago. Goes to sleep fine but has difficulty staying asleep. Has been taking the lowest dose of Melatonin but thinking she wants to increase this.    Observations/Objective: Today's visit was via telephone Physical exam was not possible for this visit Alert, oriented. Cheerful affect. Thoughts logical, coherent and relevant.   Assessment and Plan: Problem List Items Addressed This Visit      Other   Insomnia - Primary       Follow Up Instructions: Defers medication at this time.  Recommend Melatonin ER maximum dose. To discuss with pharmacist if she has any questions.  Discussed sleep hygiene including avoiding ambient light.  To call back in a few weeks if no improvement, plan to start low dose Trazodone at that time.   I discussed the assessment and treatment plan with the patient. The patient was provided an opportunity to ask questions and all were answered. The patient agreed with the plan and demonstrated an understanding of the instructions.   The patient was advised to call back or seek an in-person  evaluation if the symptoms worsen or if the condition fails to improve as anticipated.  I provided 15 minutes of non-face-to-face time during this encounter.        Objective:   Physical Exam        Assessment & Plan:

## 2020-02-23 ENCOUNTER — Encounter: Payer: Self-pay | Admitting: Nurse Practitioner

## 2020-02-25 ENCOUNTER — Telehealth: Payer: Self-pay | Admitting: Family Medicine

## 2020-02-25 NOTE — Telephone Encounter (Signed)
Pt discussed with Eber Jones her difficulty sleeping. She was advised to start melatonin extended release. Since starting it she is having a harder time falling asleep and staying asleep. She is wanting to know if she should be taking something that is just melatonin only and nothing else.   Told pt Eber Jones is not in office but that we would contact her once Eber Jones advises.

## 2020-02-26 ENCOUNTER — Other Ambulatory Visit: Payer: Self-pay | Admitting: Nurse Practitioner

## 2020-02-26 MED ORDER — TRAZODONE HCL 50 MG PO TABS: 25.0000 mg | ORAL_TABLET | Freq: Every evening | ORAL | 2 refills | Status: DC | PRN

## 2020-02-26 NOTE — Telephone Encounter (Signed)
As we discussed, I will send in a Rx for Trazodone to take prn. Will start with the lowest doses. DC med and contact office if any adverse effects. Thanks, Eber Jones

## 2020-02-27 NOTE — Telephone Encounter (Signed)
Discussed with pt. Pt verbalized understanding.  °

## 2020-02-27 NOTE — Telephone Encounter (Signed)
Left message to return call 

## 2020-03-06 ENCOUNTER — Other Ambulatory Visit: Payer: Self-pay | Admitting: Rheumatology

## 2020-03-06 NOTE — Telephone Encounter (Signed)
Last visit: 02/08/2020 Next visit: 07/11/2020  Current Dose per office note on 02/08/2020: Otezla 30 mg 1 tablet by mouth twice daily.   Okay to refill per Dr. Deveshwar  

## 2020-03-24 ENCOUNTER — Encounter: Payer: Self-pay | Admitting: Family Medicine

## 2020-03-24 ENCOUNTER — Other Ambulatory Visit: Payer: Self-pay

## 2020-03-24 ENCOUNTER — Ambulatory Visit: Payer: Medicare Other | Admitting: Family Medicine

## 2020-03-24 VITALS — BP 122/82 | Temp 96.5°F | Wt 147.0 lb

## 2020-03-24 DIAGNOSIS — F5104 Psychophysiologic insomnia: Secondary | ICD-10-CM | POA: Diagnosis not present

## 2020-03-24 MED ORDER — ALPRAZOLAM 0.5 MG PO TABS: ORAL_TABLET | ORAL | 0 refills | Status: DC

## 2020-03-24 NOTE — Progress Notes (Signed)
   Subjective:    Patient ID: [name], female    DOB: October 20, 1945, 74 y.o.   MRN: [id number]  HPI Pt here for trouble sleeping. Pt can fall asleep but does not stay asleep long. Pt states she has tried Melatonin. [name] has prescribed Trazodone but pt was scared to take it due to side effects. Pt tries not to sleep/nap during the day.  Significant amount of time was spent with the patient discussing sleep as we get older as well as behavioral approaches and pharmaceutical options Patient denies being depressed or stressed denies caffeine use  Review of Systems     Objective:   Physical Exam  Fall Risk  12/14/2019 05/30/2018 05/12/2017 03/30/2016 12/27/2014  Falls in the past year? 0 No No No No  Follow up Falls evaluation completed - - - -         Assessment & Plan:  20 minutes spent with patient Chronic insomnia Behavioral approach is recommended Xanax may be used when having multiple nights in a row of poor sleep but not intended for every night use caution drowsiness Patient to follow-up in a month's time if worse follow-up sooner problems  Patient requests consultation with sleep specialist which I believe is fine to do

## 2020-03-25 ENCOUNTER — Other Ambulatory Visit: Payer: Self-pay | Admitting: *Deleted

## 2020-03-25 DIAGNOSIS — F5104 Psychophysiologic insomnia: Secondary | ICD-10-CM

## 2020-04-01 IMAGING — MG MM DIGITAL SCREENING BILAT W/ TOMO W/ CAD
8 series · 8 of 24 positions shown · non-contrast
Comparison: Previous exam(s).

CLINICAL DATA: Screening.

EXAM:
DIGITAL SCREENING BILATERAL MAMMOGRAM WITH TOMO AND CAD

[R MLO synth-2D]
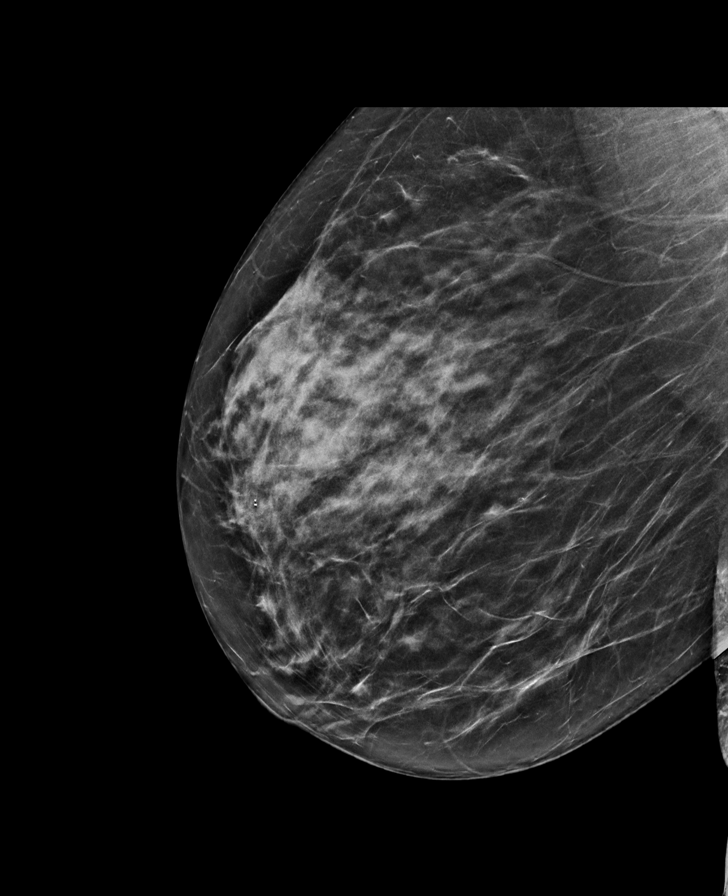

[L CC synth-2D]
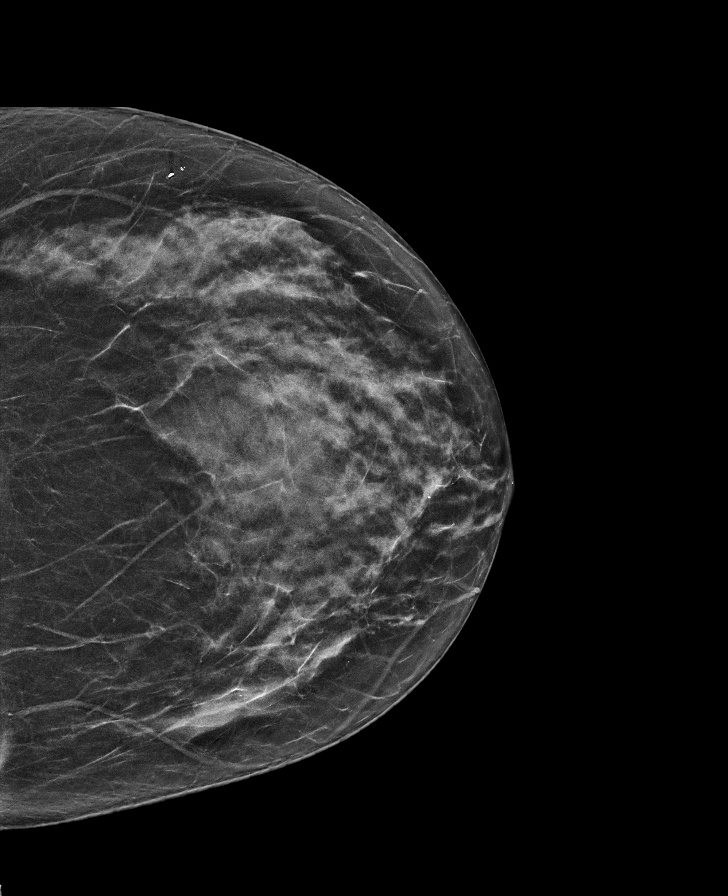

[R CC synth-2D]
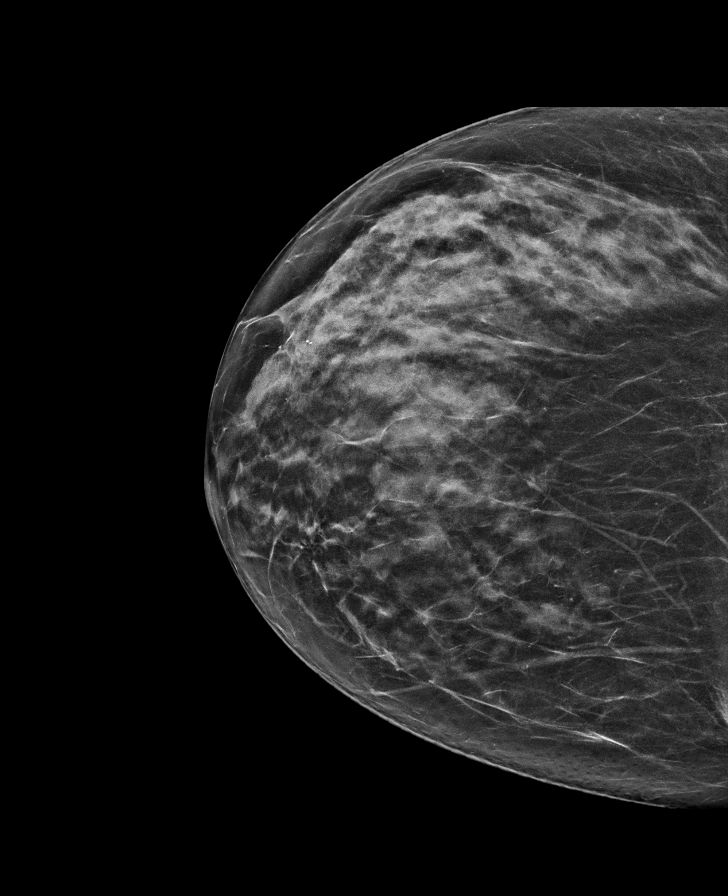

[L MLO synth-2D]
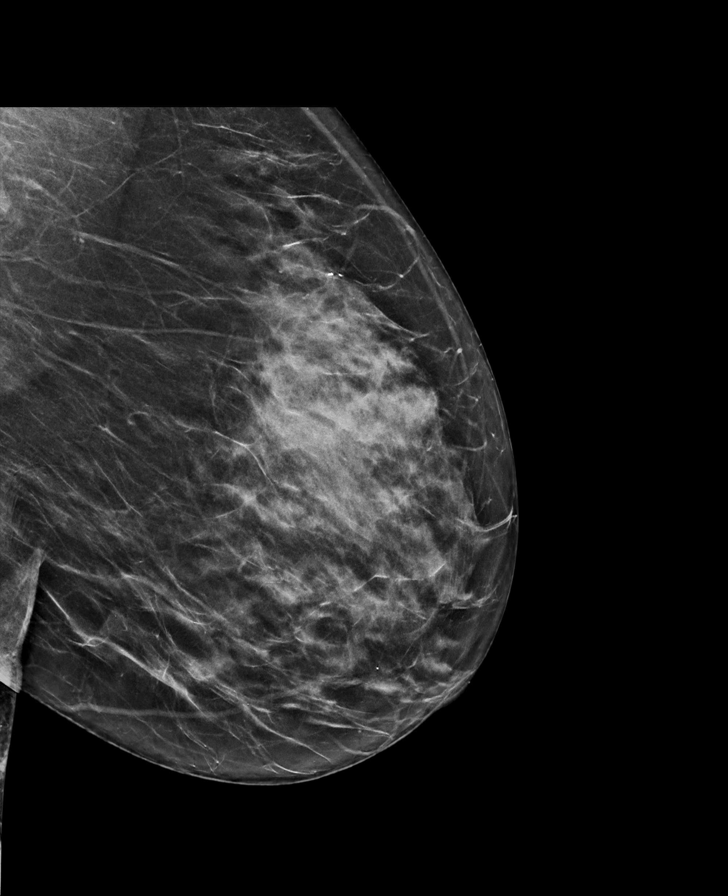

[R CC tomo · tomo slice 33/64.0]
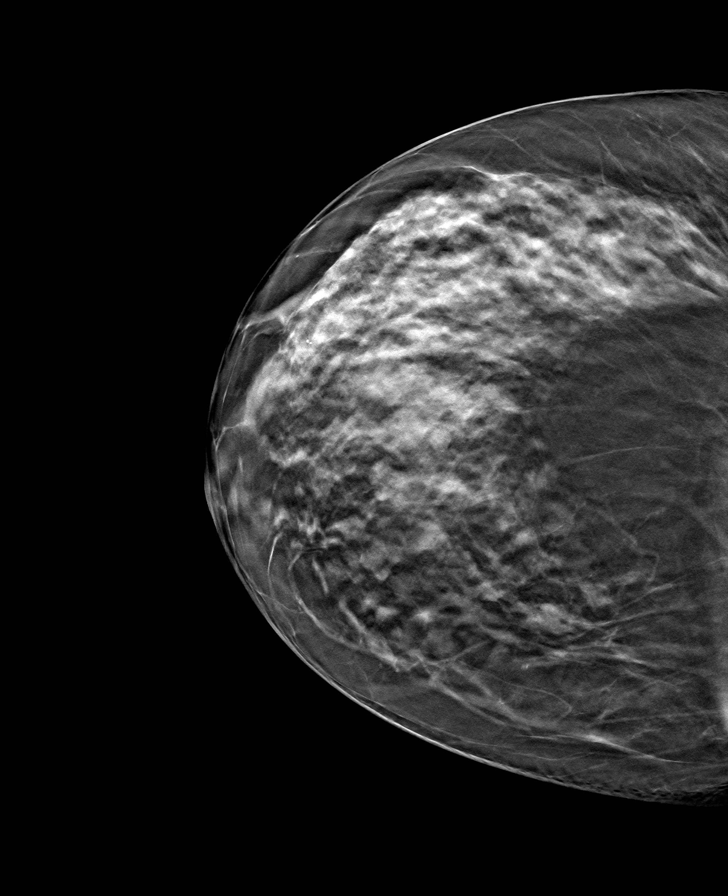

[R MLO tomo · tomo slice 33/66.0]
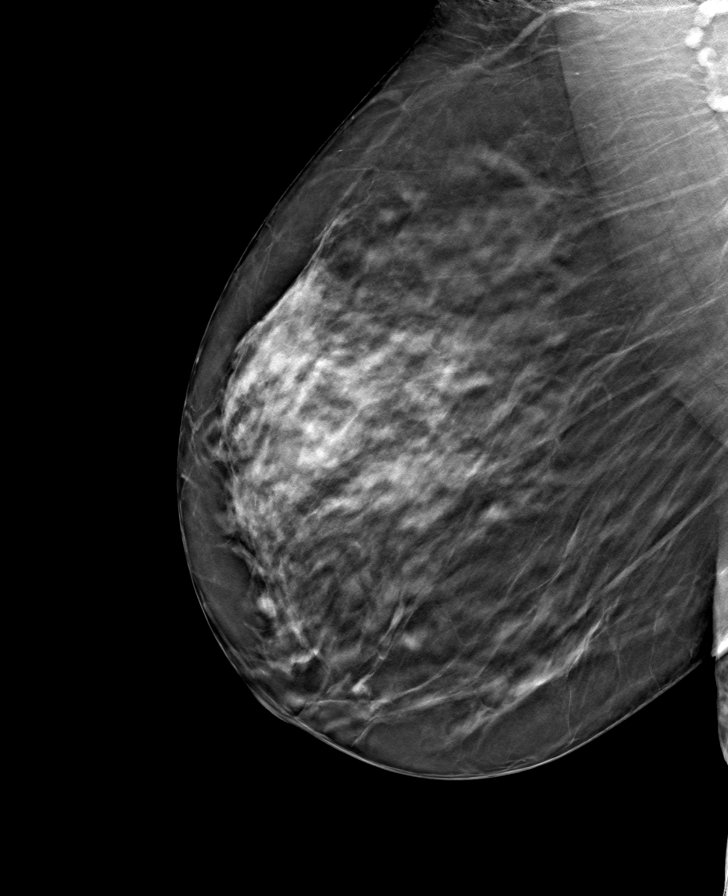

[L CC tomo · tomo slice 30/59.0]
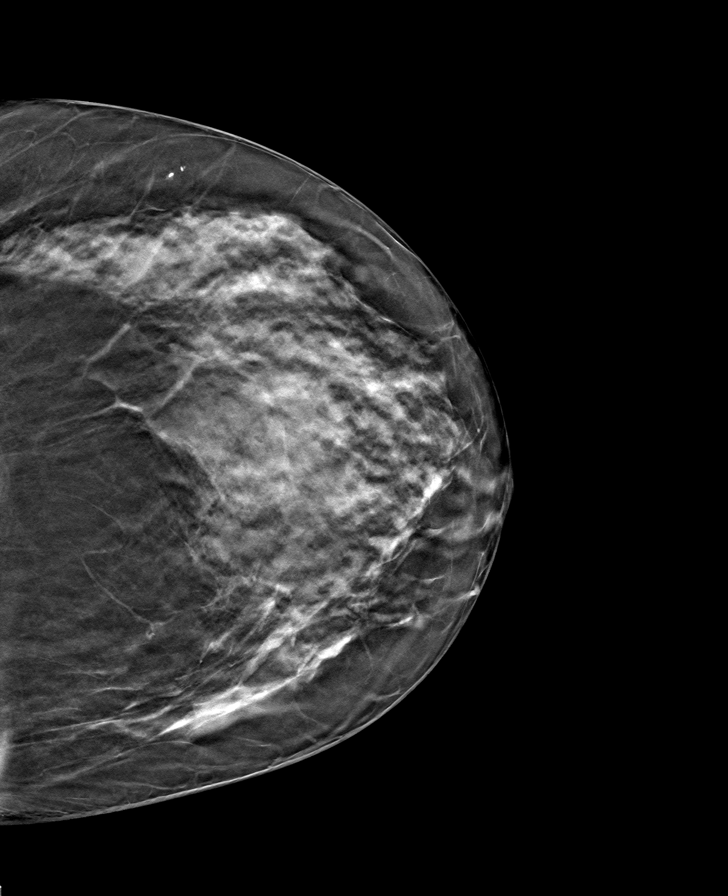

[L MLO tomo · tomo slice 33/65.0]
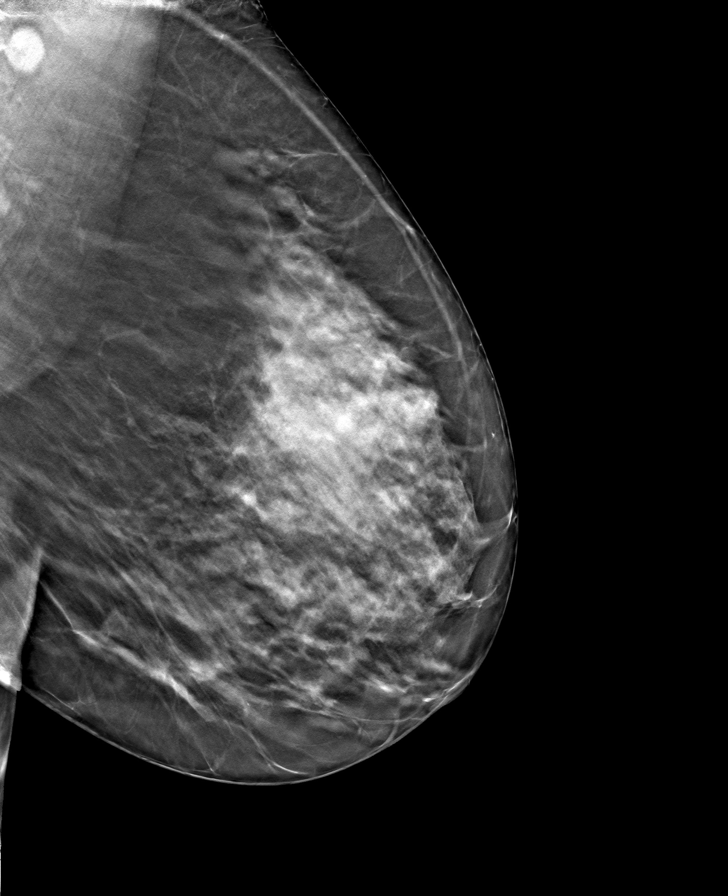

[8 of 24 positions shown; findings below may reference images not displayed]

ACR Breast Density Category c: The breast tissue is heterogeneously
dense, which may obscure small masses.
FINDINGS: There are no findings suspicious for malignancy. Images were
processed with CAD.
IMPRESSION: No mammographic evidence of malignancy. A result letter of this
screening mammogram will be mailed directly to the patient.

RECOMMENDATION:
Screening mammogram in one year. (Code:FT-U-LHB)

BI-RADS CATEGORY  1: Negative.

## 2020-04-04 ENCOUNTER — Encounter: Payer: Self-pay | Admitting: Family Medicine

## 2020-05-01 ENCOUNTER — Ambulatory Visit: Payer: Medicare Other | Admitting: Neurology

## 2020-05-01 ENCOUNTER — Encounter: Payer: Self-pay | Admitting: Neurology

## 2020-05-01 VITALS — BP 134/66 | HR 49 | Ht 62.0 in | Wt 147.5 lb

## 2020-05-01 DIAGNOSIS — Z820 Family history of epilepsy and other diseases of the nervous system: Secondary | ICD-10-CM | POA: Diagnosis not present

## 2020-05-01 DIAGNOSIS — G2581 Restless legs syndrome: Secondary | ICD-10-CM

## 2020-05-01 DIAGNOSIS — G479 Sleep disorder, unspecified: Secondary | ICD-10-CM | POA: Diagnosis not present

## 2020-05-01 DIAGNOSIS — G478 Other sleep disorders: Secondary | ICD-10-CM

## 2020-05-01 DIAGNOSIS — G4700 Insomnia, unspecified: Secondary | ICD-10-CM

## 2020-05-01 NOTE — Progress Notes (Signed)
Subjective:    Patient ID: [name] is a 74 y.o. female.  HPI     Huston FoleySaima Tyreesha Maharaj, MD, PhD [facility] [address], [address] [address] [place], [place] [address]  Dear Dr. Gerda DissLuking,   I saw your patient, [name], upon your kind request, in my Sleep clinic today for initial consultation of her sleep disorder, in particular, difficulty maintaining sleep.  The patient is unaccompanied today.  As you know, [name] is a 74 year old right-handed woman with an underlying medical history of psoriatic arthritis, hyperlipidemia, and borderline overweight state, who reports a longstanding history of difficulty maintaining sleep.  She has had this problem for several years.  Some years ago, she had a sleep study which was negative for any significant sleep apnea.  She was not recommended to have CPAP therapy.  She does report that 2 of her sisters have CPAP machines.  She is a restless sleeper, does not typically get restful sleep.  She tries to be in bed around 11 but is not very set in her bedtime routine.  Rise time is around 6 or 7.  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  She has no pets in the household.  She drinks caffeine and limitation, not daily, she is a non-smoker and drinks alcohol rarely, on special occasion typically.  She has tried over-the-counter melatonin but it was not helpful, in fact, she felt that she had more difficulty maintaining sleep.  She denies any night to night nocturia or recurrent morning headaches.  She has not tried any prescription sleep aid, was offered a prescription for trazodone in the recent past but decided not to take it for fear of side effects.  Her Epworth sleepiness score is 8 out of 24, fatigue severity score is 20 out of 63.  I reviewed your office note from 03/24/2020.  She was given a trial of low-dose alprazolam.   She reports that she has tried it on 2 occasions, half a pill, it was helpful.  She knows not to take it on a daily basis or rely on it.  She has a TV in the bedroom but does not watch it typically before falling asleep.  She does watch TV in the den in the evening.  She has had intermittent symptoms of restlessness in the legs, these do not occur on a regular basis and not typically when she is in bed trying to fall asleep but has had some restlessness from time to time in her legs.  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  Her Past Medical History Is Significant For: Past Medical History:  Diagnosis Date  . Hx of colonic polyps   . Hypercholesterolemia   . Psoriatic arthritis (HCC)     Her Past Surgical History Is Significant For: Past Surgical History:  Procedure Laterality Date  . ABDOMINAL HYSTERECTOMY    . COLONOSCOPY  08/25/2011   Procedure: COLONOSCOPY;  Surgeon: Malissa HippoNajeeb U Rehman, MD;  [facility];  Service: Endoscopy;  Laterality: N/A;  12:00  . COLONOSCOPY N/A 09/21/2018   Procedure: COLONOSCOPY;  Surgeon: Malissa Hippoehman, Najeeb U, MD;  Location: [facility];  Service: Endoscopy;  Laterality: N/A;  830  . left knee stable out  1966  . left knee staple in  1962  . VESICOVAGINAL FISTULA CLOSURE W/ TAH  1986    Her Family History Is  Significant For: Family History  Problem Relation Age of Onset  . Anesthesia problems Mother   . Cancer Mother   . Hypertension Mother   . Breast cancer Mother   . Cancer Father   . Diabetes Father   . Deep vein thrombosis Father   . Hypertension Father   . Arthritis Other        FH  . Diabetes Other        FH  . Cancer Other        FH  . Hypertension Sister   . Hypertension Brother   . Heart disease Brother   . Migraines Sister   . Migraines Sister   . Hypotension Neg Hx   . Malignant hyperthermia Neg Hx   . Pseudochol deficiency Neg Hx     Her Social History Is Significant For: Social History   Socioeconomic  History  . Marital status: Widowed    Spouse name: Not on file  . Number of children: Not on file  . Years of education: Not on file  . Highest education level: Not on file  Occupational History  . Not on file  Tobacco Use  . Smoking status: Never Smoker  . Smokeless tobacco: Never Used  Vaping Use  . Vaping Use: Never used  Substance and Sexual Activity  . Alcohol use: No  . Drug use: No  . Sexual activity: Not on file  Other Topics Concern  . Not on file  Social History Narrative  . Not on file   Social Determinants of Health   Financial Resource Strain:   . Difficulty of Paying Living Expenses:   Food Insecurity:   . Worried About Programme researcher, broadcasting/film/video in the Last Year:   . Barista in the Last Year:   Transportation Needs:   . Freight forwarder (Medical):   Marland Kitchen Lack of Transportation (Non-Medical):   Physical Activity:   . Days of Exercise per Week:   . Minutes of Exercise per Session:   Stress:   . Feeling of Stress :   Social Connections:   . Frequency of Communication with Friends and Family:   . Frequency of Social Gatherings with Friends and Family:   . Attends Religious Services:   . Active Member of Clubs or Organizations:   . Attends Banker Meetings:   Marland Kitchen Marital Status:     Her Allergies Are:  Allergies  Allergen Reactions  . Etodolac Other (See Comments)    REACTION: chest pain and nausea  :   Her Current Medications Are:  Outpatient Encounter Medications as of 05/01/2020  Medication Sig  . ALPRAZolam (XANAX) 0.5 MG tablet 1/2 to 1 qhs prn insomnia infrequent use please  . Cholecalciferol (VITAMIN D3) 50 MCG (2000 UT) TABS Take 2,000 Units by mouth every evening.  Marland Kitchen OTEZLA 30 MG TABS TAKE 1 TABLET BY MOUTH TWICE DAILY  . rosuvastatin (CRESTOR) 5 MG tablet Take 1 tablet (5 mg total) by mouth daily.   No facility-administered encounter medications on file as of 05/01/2020.  :   Review of Systems:  Out of a complete 14  point review of systems, all are reviewed and negative with the exception of these symptoms as listed below:  Review of Systems  Neurological:       Referred by PCP Dr. Lilyan Punt for reevaluation for OSA. Has had a sleep study before but was told that she did not qualify for pap therapy. Is able to  sleep for a few hours at night but will wake up around 4am. Unable to go back to sleep. States this is normal for her and she has not seen any changes.   Epworth Sleepiness Scale 0= would never doze 1= slight chance of dozing 2= moderate chance of dozing 3= high chance of dozing  Sitting and reading: 1 Watching TV: 2 Sitting inactive in a public place (ex. Theater or meeting): 0 As a passenger in a car for an hour without a break: 2 Lying down to rest in the afternoon: 3 Sitting and talking to someone: 0 Sitting quietly after lunch (no alcohol): 0 In a car, while stopped in traffic: 0 Total:8     Objective:  Neurological Exam  Physical Exam Physical Examination:   Vitals:   05/01/20 1109  BP: (!) 134/66  Pulse: 49    General Examination: The patient is a very pleasant 74 y.o. female in no acute distress. She appears well-developed and well-nourished and well groomed.   HEENT: Normocephalic, atraumatic, pupils are equal, round and reactive to light, extraocular tracking is good without limitation to gaze excursion or nystagmus noted. Hearing is grossly intact. Face is symmetric with normal facial animation. Speech is clear with no dysarthria noted. There is no hypophonia. There is no lip, neck/head, jaw or voice tremor. Neck is supple with full range of passive and active motion. There are no carotid bruits on auscultation. Oropharynx exam reveals: mild mouth dryness, adequate dental hygiene and mild airway crowding, due to small airway entry and redundant soft palate, normal appearing uvula, Mallampati class II, neck circumference 14-5/8 inches.  She has a minimal overbite. Tongue  protrudes centrally and palate elevates symmetrically. Tonsils are small.   Chest: Clear to auscultation without wheezing, rhonchi or crackles noted.  Heart: S1+S2+0, regular and normal without murmurs, rubs or gallops noted.   Abdomen: Soft, non-tender and non-distended with normal bowel sounds appreciated on auscultation.  Extremities: There is no pitting edema in the distal lower extremities bilaterally.   Skin: Warm and dry without trophic changes noted.   Musculoskeletal: exam reveals no obvious joint deformities, tenderness or joint swelling or erythema.   Neurologically:  Mental status: The patient is awake, alert and oriented in all 4 spheres. Her immediate and remote memory, attention, language skills and fund of knowledge are appropriate. There is no evidence of aphasia, agnosia, apraxia or anomia. Speech is clear with normal prosody and enunciation. Thought process is linear. Mood is normal and affect is normal.  Cranial nerves II - XII are as described above under HEENT exam.  Motor exam: Normal bulk, strength and tone is noted. There is no tremor, Romberg is negative, with the exception of slight sway. Fine motor skills and coordination: grossly intact.  Cerebellar testing: No dysmetria or intention tremor. There is no truncal or gait ataxia.  Sensory exam: intact to light touch in the upper and lower extremities.  Gait, station and balance: She stands easily. No veering to one side is noted. No leaning to one side is noted. Posture is age-appropriate and stance is narrow based. Gait shows normal stride length and normal pace. No problems turning are noted. Tandem walk is slightly challenging but doable.                 Assessment and Plan:   In summary, [name] is a very pleasant 74 y.o.-year old female with an underlying medical history of psoriatic arthritis, hyperlipidemia, and borderline overweight state, who presents  for evaluation of her sleep disturbance, in  particular, difficulty maintaining sleep and nonrestorative sleep of several years duration.  While not her history is not telltale, underlying sleep disordered breathing cannot be excluded.  She does have a family history of sleep apnea.  She is advised to proceed with a laboratory attended sleep study.  She can bring her Xanax for the sleep study.   We talked about the importance of maintaining good sleep hygiene.  She is advised regarding the diagnosis of obstructive sleep apnea, its prognosis and treatment options.  I explained in particular the CPAP or AutoPap therapy option to her.  She would be willing to try positive airway pressure treatment if the need arises.  She is also advised that chronic sleep maintenance difficulty can be difficult to treat, is not always amenable to nonprescription or prescription sleep aids.  As you already pointed out, behavior modification can help and also cognitive behavioral therapy.   She also reports some intermittent symptoms in keeping with restless leg syndrome.  We will be on the look out for periodic leg movements during her sleep study.   I plan to see her back after sleep study, we will also keep her posted as to her results by phone call.  I answered all her questions today and she was in agreement.   Thank you very much for allowing me to participate in the care of this nice patient. If I can be of any further assistance to you please do not hesitate to call me at [phone].  Sincerely,   Huston Foley, MD, PhD

## 2020-05-01 NOTE — Patient Instructions (Addendum)
Based on your symptoms and your exam I believe we should look for an underlying organic sleep disorder, such as obstructive sleep apnea (OSA) with a sleep study.  If you have OSA, I want you to consider treatment with CPAP. Please remember, the risks and ramifications of moderate to severe obstructive sleep apnea or OSA are: Cardiovascular disease, including congestive heart failure, stroke, difficult to control hypertension, arrhythmias, and even type 2 diabetes has been linked to untreated OSA. Sleep apnea causes disruption of sleep and sleep deprivation in most cases, which, in turn, can cause recurrent headaches, problems with memory, mood, concentration, focus, and vigilance. Most people with untreated sleep apnea report excessive daytime sleepiness, which can affect their ability to drive. Please do not drive if you feel sleepy.   We will call you after your sleep study to advise about the results (most likely, you will hear from [facility], my nurse).    Our sleep lab administrative assistant, will call you to schedule your sleep study. If you don't hear back from her by about 2 weeks from now, please feel free to call her at [phone]. This is her direct line and please leave a message with your phone number to call back if you get the voicemail box. She will call back as soon as possible.   Please remember to try to maintain good sleep hygiene, which means: Keep a regular sleep and wake schedule, try not to exercise or have a meal within 2 hours of your bedtime, try to keep your bedroom conducive for sleep, that is, cool and dark, without light distractors such as an illuminated alarm clock, and refrain from watching TV right before sleep or in the middle of the night and do not keep the TV or radio on during the night. Also, try not to use or play on electronic devices at bedtime, such as your cell phone, tablet PC or laptop. If you like to read at bedtime on an electronic device, try to dim the  background light as much as possible. Do not eat in the middle of the night.

## 2020-05-06 ENCOUNTER — Telehealth: Payer: Self-pay

## 2020-05-06 NOTE — Telephone Encounter (Signed)
LVM for pt to call me back to schedule sleep study  

## 2020-05-20 ENCOUNTER — Ambulatory Visit (INDEPENDENT_AMBULATORY_CARE_PROVIDER_SITE_OTHER): Payer: Medicare Other | Admitting: Neurology

## 2020-05-20 DIAGNOSIS — G4733 Obstructive sleep apnea (adult) (pediatric): Secondary | ICD-10-CM | POA: Diagnosis not present

## 2020-05-20 DIAGNOSIS — G4700 Insomnia, unspecified: Secondary | ICD-10-CM

## 2020-05-20 DIAGNOSIS — Z820 Family history of epilepsy and other diseases of the nervous system: Secondary | ICD-10-CM

## 2020-05-20 DIAGNOSIS — G479 Sleep disorder, unspecified: Secondary | ICD-10-CM

## 2020-05-20 DIAGNOSIS — G478 Other sleep disorders: Secondary | ICD-10-CM

## 2020-05-20 DIAGNOSIS — G4720 Circadian rhythm sleep disorder, unspecified type: Secondary | ICD-10-CM

## 2020-05-20 DIAGNOSIS — G2581 Restless legs syndrome: Secondary | ICD-10-CM

## 2020-05-22 ENCOUNTER — Other Ambulatory Visit: Payer: Self-pay | Admitting: Rheumatology

## 2020-05-22 NOTE — Telephone Encounter (Signed)
Last visit: 02/08/2020 Next visit: 07/11/2020  Current Dose per office note on 02/08/2020: Otezla 30 mg 1 tablet by mouth twice daily.   Okay to refill per Dr. Corliss Skains

## 2020-06-02 ENCOUNTER — Telehealth: Payer: Self-pay

## 2020-06-02 NOTE — Progress Notes (Signed)
Patient referred by Dr. Gerda Diss, seen by me on 05/01/20, diagnostic PSG on 05/20/20.   Please call and notify the patient that the recent sleep study did not show any significant obstructive sleep apnea, with the exception of mild, intermittent snoring and mild REM and supine related obstructive sleep apnea.  Treatment with positive airway pressure is not warranted, avoiding the supine sleep position will likely alleviate her mild snoring and positional stage related sleep apnea.  At this juncture, she can FU with her PCP.   Huston Foley, MD, PhD [facility])

## 2020-06-02 NOTE — Telephone Encounter (Signed)
-----   Message from Huston Foley, MD sent at 06/02/2020  9:30 AM EDT ----- Patient referred by Dr. Gerda Diss, seen by me on 05/01/20, diagnostic PSG on 05/20/20.   Please call and notify the patient that the recent sleep study did not show any significant obstructive sleep apnea, with the exception of mild, intermittent snoring and mild REM and supine related obstructive sleep apnea.  Treatment with positive airway pressure is not warranted, avoiding the supine sleep position will likely alleviate her mild snoring and positional stage related sleep apnea.  At this juncture, she can FU with her PCP.   Huston Foley, MD, PhD [facility])

## 2020-06-02 NOTE — Telephone Encounter (Signed)
I called the pt and advised of sleep study results.  She verbalized understanding and will f/u with PCP.  Report has been sent.

## 2020-06-02 NOTE — Procedures (Signed)
PATIENT'S NAME:  [name], [name] DOB:      [birth year]      MR#:    [id number]     DATE OF RECORDING: 05/20/2020 REFERRING M.D.:  Lilyan Punt, MD Study Performed:   Baseline Polysomnogram HISTORY: 74 year old woman with a history of psoriatic arthritis, hyperlipidemia, and borderline overweight state, who reports a longstanding history of difficulty maintaining sleep. She has had this problem for several years. Some years ago, she had a sleep study which was negative for any significant sleep apnea. The patient endorsed the Epworth Sleepiness Scale at 8/24 points. The patient's weight 147 pounds with a height of 62 (inches), resulting in a BMI of 27.2 kg/m2. The patient's neck circumference measured 14.6 inches.  CURRENT MEDICATIONS: Xanax, Vitamin D3, Otezla, Crestor.   PROCEDURE:  This is a multichannel digital polysomnogram utilizing the Somnostar 11.2 system.  Electrodes and sensors were applied and monitored per AASM Specifications.   EEG, EOG, Chin and Limb EMG, were sampled at 200 Hz.  ECG, Snore and Nasal Pressure, Thermal Airflow, Respiratory Effort, CPAP Flow and Pressure, Oximetry was sampled at 50 Hz. Digital video and audio were recorded.      BASELINE STUDY  Lights Out was at 21:51 and Lights On at 04:57.  Total recording time (TRT) was 426.5 minutes, with a total sleep time (TST) of 328 minutes.   The patient's sleep latency was 45.5 minutes.  REM latency was 92.5 minutes, which is normal. The sleep efficiency was 76.9 %.     SLEEP ARCHITECTURE: WASO (Wake after sleep onset) was 73 minutes with mild to moderate sleep fragmentation noted. There were 8 minutes in Stage N1, 220.5 minutes Stage N2, 51.5 minutes Stage N3 and 48 minutes in Stage REM.  The percentage of Stage N1 was 2.4%, Stage N2 was 67.2%, which is increased, stage N3 was 15.7% and Stage R (REM sleep) was 14.6%, which is reduced. The arousals were noted as: 34 were spontaneous, 0 were associated with PLMs, 7 were  associated with respiratory events.  RESPIRATORY ANALYSIS:  There were a total of 23 respiratory events:  4 obstructive apneas, 14 central apneas and 1 mixed apneas with a total of 19 apneas and an apnea index (AI) of 3.5 /hour. There were 4 hypopneas with a hypopnea index of .7 /hour. The patient also had 0 respiratory event related arousals (RERAs).      The total APNEA/HYPOPNEA INDEX (AHI) was 4.2/hour and the total RESPIRATORY DISTURBANCE INDEX was  4.2 /hour.  10 events occurred in REM sleep and 14 events in NREM. The REM AHI was  12.5 /hour, versus a non-REM AHI of 2.8. The patient spent 150.5 minutes of total sleep time in the supine position and 178 minutes in non-supine.. The supine AHI was 5.6 versus a non-supine AHI of 3.0.  OXYGEN SATURATION & C02:  The Wake baseline 02 saturation was 95%, with the lowest being 84%. Time spent below 89% saturation equaled 1 minutes.  PERIODIC LIMB MOVEMENTS: The patient had a total of 0 Periodic Limb Movements.  The Periodic Limb Movement (PLM) index was 0 and the PLM Arousal index was 0/hour.  Audio and video analysis did not show any abnormal or unusual movements, behaviors, phonations or vocalizations. The patient took 1 bathroom break. Soft, intermittent snoring was noted. The EKG was in keeping with normal sinus rhythm (NSR).  Post-study, the patient indicated that sleep was better than usual.   IMPRESSION:  1. Dysfunctions associated with sleep stages or arousal from  sleep  RECOMMENDATIONS:  1. This study does not demonstrate any significant obstructive or central sleep disordered breathing, with the exception of mild, intermittent snoring and mild REM and supine related obstructive sleep apnea.  Treatment with positive airway pressure is not warranted, avoiding the supine sleep position will likely alleviate her mild snoring and positional stage related sleep apnea. This study does not support an intrinsic sleep disorder as a cause of the  patient's symptoms. Other causes, including circadian rhythm disturbances, an underlying mood disorder, medication effect and/or an underlying medical problem cannot be ruled out. 2. This study shows sleep fragmentation and mildly abnormal sleep stage percentages; these are nonspecific findings and per se do not signify an intrinsic sleep disorder or a cause for the patient's sleep-related symptoms. Causes include (but are not limited to) the first night effect of the sleep study, circadian rhythm disturbances, medication effect or an underlying mood disorder or medical problem.  3. The patient should be cautioned not to drive, work at heights, or operate dangerous or heavy equipment when tired or sleepy. Review and reiteration of good sleep hygiene measures should be pursued with any patient. 4. The patient will be advised to follow up with the referring provider, who will be notified of the test results.  I certify that I have reviewed the entire raw data recording prior to the issuance of this report in accordance with the Standards of Accreditation of the American Academy of Sleep Medicine (AASM)   Huston Foley, MD, PhD Diplomat, American Board of Neurology and Sleep Medicine (Neurology and Sleep Medicine)

## 2020-06-27 NOTE — Progress Notes (Signed)
Office Visit Note  Patient: [name]             Date of Birth: Mar 26, 1946           MRN: [id number]             PCP: Babs Sciara, MD Referring: Babs Sciara, MD Visit Date: 07/11/2020 Occupation: @GUAROCC @  Subjective:  Medication monitoring   History of Present Illness: [name] is a 74 y.o. female with history of psoriatic arthritis and osteoarthritis.  Patient is taking Otezla 30 mg 1 tablet twice daily.  She is tolerating Otezla without any side effects.  She denies any recent infections.  She denies any recent psoriatic arthritis flares.  She denies any joint pain or joint swelling at this time.  She denies any recurrence of the left knee joint effusion.  She has been walking and going to the gym on a daily basis for exercise.  She reports that she has been trying to work on her posture while performing some exercises.  She denies any lower back or SI joint pain at this time.  She denies any Achilles tendinitis or plantar fasciitis.  She has occasional small patches of psoriasis which typically resolve quickly with topical agents. She denies any recent falls or fractures.  She continues to take vitamin D 2000 units daily.  She is due to update her bone density.  She has an upcoming physical with her PCP in November and plans to have lab work at that time.      Activities of Daily Living:  Patient reports morning stiffness for 0 none.   Patient Denies nocturnal pain.  Difficulty dressing/grooming: Denies Difficulty climbing stairs: Denies Difficulty getting out of chair: Denies Difficulty using hands for taps, buttons, cutlery, and/or writing: Denies  Review of Systems  Constitutional: Negative for fatigue.  HENT: Negative for mouth sores, mouth dryness and nose dryness.   Eyes: Negative for pain, visual disturbance and dryness.  Respiratory: Negative for cough, hemoptysis, shortness of breath and difficulty breathing.   Cardiovascular: Negative for  chest pain, palpitations, hypertension and swelling in legs/feet.  Gastrointestinal: Negative for blood in stool, constipation and diarrhea.  Endocrine: Negative for excessive thirst and increased urination.  Genitourinary: Negative for difficulty urinating and painful urination.  Musculoskeletal: Negative for arthralgias, joint pain, joint swelling, myalgias, muscle weakness, morning stiffness, muscle tenderness and myalgias.  Skin: Negative for color change, pallor, rash, hair loss, nodules/bumps, skin tightness, ulcers and sensitivity to sunlight.  Allergic/Immunologic: Negative for susceptible to infections.  Neurological: Negative for dizziness, numbness, headaches and weakness.  Hematological: Negative for bruising/bleeding tendency and swollen glands.  Psychiatric/Behavioral: Positive for sleep disturbance. Negative for depressed mood. The patient is not nervous/anxious.     PMFS History:  Patient Active Problem List   Diagnosis Date Noted  . History of colonic polyps 07/04/2018  . History of hyperlipidemia 02/09/2017  . Other psoriasis 12/30/2016  . High risk medications (not anticoagulants) long-term use 12/30/2016  . Psoriatic arthritis (HCC) 12/27/2014  . Vitamin D deficiency 08/06/2013  . Osteopenia 08/06/2013  . Insomnia 02/22/2013  . Hyperlipidemia 01/09/2013  . Anemia 09/12/2012  . Toe cyanosis 08/08/2012  . Osteoarthritis of shoulder 09/15/2010    Past Medical History:  Diagnosis Date  . Hx of colonic polyps   . Hypercholesterolemia   . Psoriatic arthritis (HCC)     Family History  Problem Relation Age of Onset  . Anesthesia problems Mother   . Cancer Mother   .  Hypertension Mother   . Breast cancer Mother   . Cancer Father   . Diabetes Father   . Deep vein thrombosis Father   . Hypertension Father   . Arthritis Other        FH  . Diabetes Other        FH  . Cancer Other        FH  . Hypertension Sister   . Hypertension Brother   . Heart disease  Brother   . Migraines Sister   . Migraines Sister   . Hypotension Neg Hx   . Malignant hyperthermia Neg Hx   . Pseudochol deficiency Neg Hx    Past Surgical History:  Procedure Laterality Date  . ABDOMINAL HYSTERECTOMY    . COLONOSCOPY  08/25/2011   Procedure: COLONOSCOPY;  Surgeon: Malissa Hippo, MD;  Location: [facility];  Service: Endoscopy;  Laterality: N/A;  12:00  . COLONOSCOPY N/A 09/21/2018   Procedure: COLONOSCOPY;  Surgeon: Malissa Hippo, MD;  Location: [facility];  Service: Endoscopy;  Laterality: N/A;  830  . left knee stable out  1966  . left knee staple in  1962  . VESICOVAGINAL FISTULA CLOSURE W/ TAH  1986   Social History   Social History Narrative  . Not on file   Immunization History  Administered Date(s) Administered  . Influenza,inj,Quad PF,6+ Mos 06/19/2019  . Influenza-Unspecified 07/13/2016, 08/02/2018  . Moderna SARS-COVID-2 Vaccination 11/28/2019, 12/26/2019  . PPD Test 11/06/2019  . Pneumococcal Conjugate-13 12/27/2014  . Pneumococcal Polysaccharide-23 03/30/2016  . Pneumococcal-Unspecified 11/06/2009     Objective: Vital Signs: BP 115/69 (BP Location: Left Arm, Patient Position: Sitting, Cuff Size: Normal)   Pulse (!) 51   Resp 16   Ht 5\' 3"  (1.6 m)   Wt 149 lb 3.2 oz (67.7 kg)   BMI 26.43 kg/m    Physical Exam Vitals and nursing note reviewed.  Constitutional:      Appearance: She is well-developed.  HENT:     Head: Normocephalic and atraumatic.  Eyes:     Conjunctiva/sclera: Conjunctivae normal.  Pulmonary:     Effort: Pulmonary effort is normal.  Abdominal:     Palpations: Abdomen is soft.  Musculoskeletal:     Cervical back: Normal range of motion.  Skin:    General: Skin is warm and dry.     Capillary Refill: Capillary refill takes less than 2 seconds.  Neurological:     Mental Status: She is alert and oriented to person, place, and time.  Psychiatric:        Behavior: Behavior normal.      Musculoskeletal  Exam: C-spine, thoracic spine, lumbar spine have good range of motion.  No midline spinal tenderness.  No SI joint tenderness.  Shoulder joints, elbow joints, wrist joints, MCPs, PIPs, DIPs have good range of motion with no synovitis.  She has PIP and DIP thickening consistent with osteoarthritis of both hands.  Hip joints have good range of motion with no discomfort.  No tenderness over trochanteric bursa bilaterally.  Knee joints have good range of motion with no discomfort.  Ankle joints have good range of motion with no tenderness or inflammation.  No Achilles tendinitis or plantar fasciitis.  No tenderness of MTP joints.  CDAI Exam: CDAI Score: 0  Patient Global: 0 mm; Provider Global: 0 mm Swollen: 0 ; Tender: 0  Joint Exam 07/11/2020   No joint exam has been documented for this visit   There is currently no  information documented on the homunculus. Go to the Rheumatology activity and complete the homunculus joint exam.  Investigation: No additional findings.  Imaging: No results found.  Recent Labs: Lab Results  Component Value Date   WBC 5.0 12/12/2019   HGB 11.7 12/12/2019   PLT 220 12/12/2019   NA 141 06/06/2019   K 4.1 06/06/2019   CL 105 06/06/2019   CO2 20 06/06/2019   GLUCOSE 82 06/06/2019   BUN 7 (L) 06/06/2019   CREATININE 0.79 06/06/2019   BILITOT 0.2 12/12/2019   ALKPHOS 105 12/12/2019   AST 25 12/12/2019   ALT 13 12/12/2019   PROT 7.2 12/12/2019   ALBUMIN 4.5 12/12/2019   CALCIUM 9.5 06/06/2019   GFRAA 86 06/06/2019    Speciality Comments: No specialty comments available.  Procedures:  No procedures performed Allergies: Etodolac   Assessment / Plan:     Visit Diagnoses: Psoriatic arthritis (HCC): She has no synovitis or dactylitis on exam.  She has not had any recent psoriatic arthritis flares.  She is clinically doing well on Otezla 30 mg 1 tablet by mouth twice daily.  She has not missed any doses of Otezla and is tolerating without any side  effects.  She is not experiencing any joint pain, joint swelling, morning stiffness, and nocturnal pain.  She remains active going to the gym and walking for exercise on a daily basis without difficulty.  She has occasional small patches of psoriasis which typically resolve quickly using topical agents.  She will continue taking Otezla 30 mg 1 tablet by mouth twice daily.  She does not need a refill at this time.  She was advised to notify us if she develops increased joint pain or joint swelling.  She will follow-up in the office in 5 months.   Other psoriasis: She has occasional small patches of psoriasis which typically resolve with topical agents.  She does not need a refill of any topical agents at this time.  She will continue taking Otezla 30 mg 1 tablet by mouth twice daily.  High risk medications (not anticoagulants) long-term use - Otezla 30 mg 1 tablet by mouth twice daily.   She will be having upcoming lab work with her PCP in November 2021 and will have lab results faxed to our office.  She has not had any recent infections.  She has received both covid-19 vaccine doses and plans on receiving the 3rd dose.  She was encouraged to continue to wear a mask and social distance.  She was advised to notify us if she develops the covid-19 infection in order to receive the antibody infusion. She voiced understanding.   Osteopenia of multiple sites: She has not had any recent falls or fractures. She exercises on a daily basis.  She continues to take vitamin D 2,000 units daily.  DEXA 06/08/18: The BMD measured at Forearm Radius 33% is 0.574 g/cm2 with a T-score of -1.9.  She is due to update DEXA and plans on reaching out to her PCP to discuss placing the order.   Vitamin D deficiency: She is taking vitamin D 2,000 units daily.   Primary osteoarthritis of left knee: She has good ROM with no discomfort.  No warmth or effusion noted.   Effusion of left knee joint: Resolved.  No recurrence.  She remains  active.   Other medical conditions are listed as follows:   History of anemia  Other insomnia  History of hyperlipidemia  Orders: No orders of the defined types were  placed in this encounter.  No orders of the defined types were placed in this encounter.    Follow-Up Instructions: Return in about 5 months (around 12/09/2020) for Psoriatic arthritis.   Gearldine Bienenstock, PA-C  Note - This record has been created using Dragon software.  Chart creation errors have been sought, but may not always  have been located. Such creation errors do not reflect on  the standard of medical care.

## 2020-07-08 ENCOUNTER — Telehealth: Payer: Self-pay | Admitting: Family Medicine

## 2020-07-08 NOTE — Telephone Encounter (Signed)
Please go ahead with referral 

## 2020-07-08 NOTE — Telephone Encounter (Signed)
Please advise. Thank you

## 2020-07-08 NOTE — Telephone Encounter (Signed)
Pt requesting referral for colonoscopy   If OK please initiate referral in system so it may be processed

## 2020-07-09 ENCOUNTER — Other Ambulatory Visit: Payer: Self-pay | Admitting: Family Medicine

## 2020-07-09 ENCOUNTER — Other Ambulatory Visit: Payer: Self-pay | Admitting: *Deleted

## 2020-07-09 DIAGNOSIS — Z1211 Encounter for screening for malignant neoplasm of colon: Secondary | ICD-10-CM

## 2020-07-09 DIAGNOSIS — Z1231 Encounter for screening mammogram for malignant neoplasm of breast: Secondary | ICD-10-CM

## 2020-07-11 ENCOUNTER — Telehealth: Payer: Self-pay | Admitting: Family Medicine

## 2020-07-11 ENCOUNTER — Encounter: Payer: Self-pay | Admitting: Physician Assistant

## 2020-07-11 ENCOUNTER — Ambulatory Visit: Payer: Medicare Other | Admitting: Physician Assistant

## 2020-07-11 ENCOUNTER — Other Ambulatory Visit: Payer: Self-pay

## 2020-07-11 VITALS — BP 115/69 | HR 51 | Resp 16 | Ht 63.0 in | Wt 149.2 lb

## 2020-07-11 DIAGNOSIS — M25462 Effusion, left knee: Secondary | ICD-10-CM

## 2020-07-11 DIAGNOSIS — L4050 Arthropathic psoriasis, unspecified: Secondary | ICD-10-CM

## 2020-07-11 DIAGNOSIS — Z862 Personal history of diseases of the blood and blood-forming organs and certain disorders involving the immune mechanism: Secondary | ICD-10-CM

## 2020-07-11 DIAGNOSIS — L408 Other psoriasis: Secondary | ICD-10-CM

## 2020-07-11 DIAGNOSIS — Z79899 Other long term (current) drug therapy: Secondary | ICD-10-CM | POA: Diagnosis not present

## 2020-07-11 DIAGNOSIS — E559 Vitamin D deficiency, unspecified: Secondary | ICD-10-CM

## 2020-07-11 DIAGNOSIS — M1712 Unilateral primary osteoarthritis, left knee: Secondary | ICD-10-CM

## 2020-07-11 DIAGNOSIS — M8589 Other specified disorders of bone density and structure, multiple sites: Secondary | ICD-10-CM | POA: Diagnosis not present

## 2020-07-11 DIAGNOSIS — G4709 Other insomnia: Secondary | ICD-10-CM

## 2020-07-11 DIAGNOSIS — Z8639 Personal history of other endocrine, nutritional and metabolic disease: Secondary | ICD-10-CM

## 2020-07-11 NOTE — Telephone Encounter (Signed)
Bone Density scheduled for Oct 15 at 3:00 pm. No calcium supplements 24-48 hrs before. Pt contacted and verbalized understanding.

## 2020-07-11 NOTE — Telephone Encounter (Signed)
Please advise. Thank you

## 2020-07-11 NOTE — Telephone Encounter (Signed)
May have order for bone density

## 2020-07-11 NOTE — Patient Instructions (Signed)

## 2020-07-11 NOTE — Telephone Encounter (Signed)
Pt called requesting a referral for bone density (may just need to be ordered)   Please advise & call pt

## 2020-07-17 ENCOUNTER — Encounter (INDEPENDENT_AMBULATORY_CARE_PROVIDER_SITE_OTHER): Payer: Self-pay | Admitting: *Deleted

## 2020-07-18 ENCOUNTER — Ambulatory Visit (HOSPITAL_COMMUNITY)
Admission: RE | Admit: 2020-07-18 | Discharge: 2020-07-18 | Disposition: A | Discharge status: Home / Self Care | Payer: Medicare Other | Source: Ambulatory Visit | Attending: Family Medicine | Admitting: Family Medicine

## 2020-07-18 ENCOUNTER — Other Ambulatory Visit: Payer: Self-pay

## 2020-07-18 DIAGNOSIS — M8589 Other specified disorders of bone density and structure, multiple sites: Secondary | ICD-10-CM | POA: Diagnosis present

## 2020-07-20 ENCOUNTER — Encounter: Payer: Self-pay | Admitting: Family Medicine

## 2020-07-31 ENCOUNTER — Telehealth: Payer: Self-pay

## 2020-07-31 DIAGNOSIS — R5383 Other fatigue: Secondary | ICD-10-CM

## 2020-07-31 DIAGNOSIS — I10 Essential (primary) hypertension: Secondary | ICD-10-CM

## 2020-07-31 DIAGNOSIS — E785 Hyperlipidemia, unspecified: Secondary | ICD-10-CM

## 2020-07-31 DIAGNOSIS — Z79899 Other long term (current) drug therapy: Secondary | ICD-10-CM

## 2020-07-31 NOTE — Telephone Encounter (Signed)
Blood work ordered in Epic. Patient notified. 

## 2020-07-31 NOTE — Telephone Encounter (Signed)
Pt needs labs order include Vitamin D pt has appt on Tuesday   Pt call back [phone]

## 2020-07-31 NOTE — Telephone Encounter (Signed)
Vitamin D level would be fine to check-vitamin D deficiency  She is also due for a metabolic 7 and lipid profile due to hyperlipidemia

## 2020-07-31 NOTE — Telephone Encounter (Signed)
Last labs via specialist 02/2020: Lipid, Vit D and Ferritin

## 2020-08-05 ENCOUNTER — Telehealth (INDEPENDENT_AMBULATORY_CARE_PROVIDER_SITE_OTHER): Payer: Medicare Other | Admitting: Family Medicine

## 2020-08-05 ENCOUNTER — Other Ambulatory Visit: Payer: Self-pay

## 2020-08-05 ENCOUNTER — Telehealth: Payer: Self-pay | Admitting: *Deleted

## 2020-08-05 DIAGNOSIS — E785 Hyperlipidemia, unspecified: Secondary | ICD-10-CM | POA: Diagnosis not present

## 2020-08-05 DIAGNOSIS — L4050 Arthropathic psoriasis, unspecified: Secondary | ICD-10-CM

## 2020-08-05 DIAGNOSIS — D509 Iron deficiency anemia, unspecified: Secondary | ICD-10-CM

## 2020-08-05 LAB — BASIC METABOLIC PANEL
BUN/Creatinine Ratio: 14 (ref 12–28)
BUN: 13 mg/dL (ref 8–27)
CO2: 23 mmol/L (ref 20–29)
Calcium: 9.6 mg/dL (ref 8.7–10.3)
Chloride: 103 mmol/L (ref 96–106)
Creatinine, Ser: 0.9 mg/dL (ref 0.57–1.00)
GFR calc Af Amer: 73 mL/min/{1.73_m2} (ref >59)
GFR calc non Af Amer: 64 mL/min/{1.73_m2} (ref >59)
Glucose: 71 mg/dL (ref 65–99)
Potassium: 4.2 mmol/L (ref 3.5–5.2)
Sodium: 140 mmol/L (ref 134–144)

## 2020-08-05 LAB — LIPID PANEL
Chol/HDL Ratio: 2.5 ratio (ref 0.0–4.4)
Cholesterol, Total: 195 mg/dL (ref 100–199)
HDL: 78 mg/dL (ref >39)
LDL Chol Calc (NIH): 98 mg/dL (ref 0–99)
Triglycerides: 109 mg/dL (ref 0–149)
VLDL Cholesterol Cal: 19 mg/dL (ref 5–40)

## 2020-08-05 LAB — VITAMIN D 25 HYDROXY (VIT D DEFICIENCY, FRACTURES): Vit D, 25-Hydroxy: 45.2 ng/mL (ref 30.0–100.0)

## 2020-08-05 MED ORDER — ROSUVASTATIN CALCIUM 5 MG PO TABS
5.0000 mg | ORAL_TABLET | Freq: Every day | ORAL | 1 refills | Status: DC
Start: 2020-08-05 — End: 2020-11-26

## 2020-08-05 NOTE — Telephone Encounter (Signed)
Ms. sianne, tejada are scheduled for a virtual visit with your provider today.    Just as we do with appointments in the office, we must obtain your consent to participate.  Your consent will be active for this visit and any virtual visit you may have with one of our providers in the next 365 days.    If you have a MyChart account, I can also send a copy of this consent to you electronically.  All virtual visits are billed to your insurance company just like a traditional visit in the office.  As this is a virtual visit, video technology does not allow for your provider to perform a traditional examination.  This may limit your provider's ability to fully assess your condition.  If your provider identifies any concerns that need to be evaluated in person or the need to arrange testing such as labs, EKG, etc, we will make arrangements to do so.    Although advances in technology are sophisticated, we cannot ensure that it will always work on either your end or our end.  If the connection with a video visit is poor, we may have to switch to a telephone visit.  With either a video or telephone visit, we are not always able to ensure that we have a secure connection.   I need to obtain your verbal consent now.   Are you willing to proceed with your visit today?   Caitrin Pendergraph Sylvestre has provided verbal consent on 08/05/2020 for a virtual visit (video or telephone).   Kathleen Lime, RN 08/05/2020  9:08 AM

## 2020-08-05 NOTE — Progress Notes (Signed)
Subjective:    Patient ID: [name], female    DOB: 03-06-1946, 74 y.o.   MRN: [id number]  Hyperlipidemia This is a chronic problem. The current episode started more than 1 year ago. Pertinent negatives include no chest pain or shortness of breath. Treatments tried: crestor. There are no compliance problems.  Risk factors for coronary artery disease include dyslipidemia and post-menopausal.   Patient would like to discuss recent labs  Results for orders placed or performed in visit on 07/31/20  Lipid panel  Result Value Ref Range   Cholesterol, Total 195 100 - 199 mg/dL   Triglycerides 353 0 - 149 mg/dL   HDL 78 >61 mg/dL   VLDL Cholesterol Cal 19 5 - 40 mg/dL   LDL Chol Calc (NIH) 98 0 - 99 mg/dL   Chol/HDL Ratio 2.5 0.0 - 4.4 ratio  Basic metabolic panel  Result Value Ref Range   Glucose 71 65 - 99 mg/dL   BUN 13 8 - 27 mg/dL   Creatinine, Ser 4.43 0.57 - 1.00 mg/dL   GFR calc non Af Amer 64 >59 mL/min/1.73   GFR calc Af Amer 73 >59 mL/min/1.73   BUN/Creatinine Ratio 14 12 - 28   Sodium 140 134 - 144 mmol/L   Potassium 4.2 3.5 - 5.2 mmol/L   Chloride 103 96 - 106 mmol/L   CO2 23 20 - 29 mmol/L   Calcium 9.6 8.7 - 10.3 mg/dL  VITAMIN D 25 Hydroxy (Vit-D Deficiency, Fractures)  Result Value Ref Range   Vit D, 25-Hydroxy 45.2 30.0 - 100.0 ng/mL   Labs discussed Unable to do video Virtual Visit via Video Note  I connected with [name] on 08/05/20 at  1:10 PM EDT by a video enabled telemedicine application and verified that I am speaking with the correct person using two identifiers.  Location: Patient: home Provider: office   I discussed the limitations of evaluation and management by telemedicine and the availability of in person appointments. The patient expressed understanding and agreed to proceed.  History of Present Illness:    Observations/Objective:   Assessment and Plan:   Follow Up Instructions:    I discussed the assessment  and treatment plan with the patient. The patient was provided an opportunity to ask questions and all were answered. The patient agreed with the plan and demonstrated an understanding of the instructions.   The patient was advised to call back or seek an in-person evaluation if the symptoms worsen or if the condition fails to improve as anticipated.  I provided 25 minutes of non-face-to-face time during this encounter.       Review of Systems  Constitutional: Negative for activity change, appetite change and fatigue.  HENT: Negative for congestion and rhinorrhea.   Respiratory: Negative for cough and shortness of breath.   Cardiovascular: Negative for chest pain and leg swelling.  Gastrointestinal: Negative for abdominal pain and diarrhea.  Endocrine: Negative for polydipsia and polyphagia.  Skin: Negative for color change.  Neurological: Negative for dizziness and weakness.  Psychiatric/Behavioral: Negative for behavioral problems and confusion.       Objective:   Physical Exam   Today's visit was via telephone Physical exam was not possible for this visit       Assessment & Plan:  1. Hyperlipidemia, unspecified hyperlipidemia type Continue meds Improve days taken Recheck labs in 5 to 6 months with ov Good diet is best  2. Psoriatic arthritis (HCC) Taking otezla rational for ongoing ude  discussed  3. Iron deficiency anemia, unspecified iron deficiency anemia type Watch diet increasae iron in deit Follow up in 5 to 6 months

## 2020-08-06 ENCOUNTER — Other Ambulatory Visit: Payer: Self-pay

## 2020-08-06 ENCOUNTER — Ambulatory Visit
Admission: RE | Admit: 2020-08-06 | Discharge: 2020-08-06 | Disposition: A | Discharge status: Home / Self Care | Payer: Medicare Other | Source: Ambulatory Visit | Attending: Family Medicine | Admitting: Family Medicine

## 2020-08-06 DIAGNOSIS — Z1231 Encounter for screening mammogram for malignant neoplasm of breast: Secondary | ICD-10-CM

## 2020-09-09 ENCOUNTER — Telehealth: Payer: Self-pay | Admitting: Pharmacist

## 2020-09-09 NOTE — Telephone Encounter (Signed)
Received call from Ms. Maguire regarding renewal application for OTEZLA through [employer] patient assistance. She plans to mail her patient portion of application.  I've faxed provider portion, insurance documents, and med list.  Fax: [phone] Phone: [phone]  Chesley Mires, PharmD, MPH Clinical Pharmacist (Rheumatology and Pulmonology)

## 2020-11-25 ENCOUNTER — Other Ambulatory Visit: Payer: Self-pay | Admitting: Family Medicine

## 2020-11-27 NOTE — Telephone Encounter (Signed)
Called  [employer] to f/u on OTEZLA PAP renewal application status. Application APPROVED  patient assistance from 10/04/20 to 10/03/21.   Phone number: [phone]  F/u with Dr. Corliss Skains scheduled 12/18/20  Chesley Mires, PharmD, MPH Clinical Pharmacist (Rheumatology and Pulmonology)

## 2020-12-02 ENCOUNTER — Other Ambulatory Visit: Payer: Self-pay | Admitting: *Deleted

## 2020-12-02 DIAGNOSIS — E785 Hyperlipidemia, unspecified: Secondary | ICD-10-CM

## 2020-12-02 DIAGNOSIS — Z79899 Other long term (current) drug therapy: Secondary | ICD-10-CM

## 2020-12-02 DIAGNOSIS — I10 Essential (primary) hypertension: Secondary | ICD-10-CM

## 2020-12-02 DIAGNOSIS — D509 Iron deficiency anemia, unspecified: Secondary | ICD-10-CM

## 2020-12-05 NOTE — Progress Notes (Signed)
Office Visit Note  Patient: [name]             Date of Birth: 1946-05-29           MRN: [id number]             PCP: Babs Sciara, MD Referring: Babs Sciara, MD Visit Date: 12/18/2020 Occupation: @GUAROCC @  Subjective:  Medication management.   History of Present Illness: [name] is a 75 y.o. female with the history of psoriatic arthritis, psoriasis and osteoarthritis.  She states she has been tolerating 66 well.  She has been taking Otezla 30 mg p.o. twice daily.  She has a few psoriasis patches.  She denies any joint swelling.  She denies any morning stiffness.  She has not had any recurrence of the effusion in her left knee joint.  Activities of Daily Living:  Patient reports morning stiffness for 0 minutes.   Patient Denies nocturnal pain.  Difficulty dressing/grooming: Denies Difficulty climbing stairs: Denies Difficulty getting out of chair: Denies Difficulty using hands for taps, buttons, cutlery, and/or writing: Denies  Review of Systems  Constitutional: Negative for fatigue, night sweats, weight gain and weight loss.  HENT: Negative for mouth sores, trouble swallowing, trouble swallowing, mouth dryness and nose dryness.   Eyes: Negative for pain, redness, itching, visual disturbance and dryness.  Respiratory: Negative for cough, hemoptysis, shortness of breath and difficulty breathing.   Cardiovascular: Negative for chest pain, palpitations, hypertension, irregular heartbeat and swelling in legs/feet.  Gastrointestinal: Negative for abdominal pain, blood in stool, constipation and diarrhea.  Endocrine: Negative for increased urination.  Genitourinary: Negative for painful urination and vaginal dryness.  Musculoskeletal: Negative for arthralgias, joint pain, joint swelling, myalgias, muscle weakness, morning stiffness, muscle tenderness and myalgias.  Skin: Positive for rash. Negative for color change, hair loss, redness, skin tightness,  ulcers and sensitivity to sunlight.  Allergic/Immunologic: Negative for susceptible to infections.  Neurological: Negative for dizziness, numbness, headaches, memory loss, night sweats and weakness.  Hematological: Negative for swollen glands.  Psychiatric/Behavioral: Positive for sleep disturbance. Negative for depressed mood and confusion. The patient is not nervous/anxious.     PMFS History:  Patient Active Problem List   Diagnosis Date Noted  . History of colonic polyps 07/04/2018  . History of hyperlipidemia 02/09/2017  . Other psoriasis 12/30/2016  . High risk medications (not anticoagulants) long-term use 12/30/2016  . Psoriatic arthritis (HCC) 12/27/2014  . Vitamin D deficiency 08/06/2013  . Osteopenia 08/06/2013  . Insomnia 02/22/2013  . Hyperlipidemia 01/09/2013  . Anemia 09/12/2012  . Toe cyanosis 08/08/2012  . Osteoarthritis of shoulder 09/15/2010    Past Medical History:  Diagnosis Date  . Hx of colonic polyps   . Hypercholesterolemia   . Psoriatic arthritis (HCC)     Family History  Problem Relation Age of Onset  . Anesthesia problems Mother   . Cancer Mother   . Hypertension Mother   . Breast cancer Mother   . Cancer Father   . Diabetes Father   . Deep vein thrombosis Father   . Hypertension Father   . Arthritis Other        FH  . Diabetes Other        FH  . Cancer Other        FH  . Hypertension Sister   . Hypertension Brother   . Heart disease Brother   . Migraines Sister   . Migraines Sister   . Hypotension Neg Hx   .  Malignant hyperthermia Neg Hx   . Pseudochol deficiency Neg Hx    Past Surgical History:  Procedure Laterality Date  . ABDOMINAL HYSTERECTOMY    . COLONOSCOPY  08/25/2011   Procedure: COLONOSCOPY;  Surgeon: Malissa Hippo, MD;  [facility];  Service: Endoscopy;  Laterality: N/A;  12:00  . COLONOSCOPY N/A 09/21/2018   Procedure: COLONOSCOPY;  Surgeon: Malissa Hippo, MD;  Location: [facility];  Service:  Endoscopy;  Laterality: N/A;  830  . left knee stable out  1966  . left knee staple in  1962  . VESICOVAGINAL FISTULA CLOSURE W/ TAH  1986   Social History   Social History Narrative  . Not on file   Immunization History  Administered Date(s) Administered  . Fluad Quad(high Dose 65+) 10/02/2020  . Influenza,inj,Quad PF,6+ Mos 06/19/2019  . Influenza-Unspecified 07/13/2016, 08/02/2018  . Moderna Sars-Covid-2 Vaccination 11/28/2019, 12/26/2019, 08/26/2020  . PPD Test 11/06/2019  . Pneumococcal Conjugate-13 12/27/2014  . Pneumococcal Polysaccharide-23 03/30/2016  . Pneumococcal-Unspecified 11/06/2009     Objective: Vital Signs: BP 123/73 (BP Location: Left Arm, Patient Position: Sitting, Cuff Size: Normal)   Pulse (!) 52   Ht 5\' 2"  (1.575 m)   Wt 150 lb 9.6 oz (68.3 kg)   BMI 27.55 kg/m    Physical Exam Vitals and nursing note reviewed.  Constitutional:      Appearance: She is well-developed.  HENT:     Head: Normocephalic and atraumatic.  Eyes:     Conjunctiva/sclera: Conjunctivae normal.  Cardiovascular:     Rate and Rhythm: Normal rate and regular rhythm.     Heart sounds: Normal heart sounds.  Pulmonary:     Effort: Pulmonary effort is normal.     Breath sounds: Normal breath sounds.  Abdominal:     General: Bowel sounds are normal.     Palpations: Abdomen is soft.  Musculoskeletal:     Cervical back: Normal range of motion.  Lymphadenopathy:     Cervical: No cervical adenopathy.  Skin:    General: Skin is warm and dry.     Capillary Refill: Capillary refill takes less than 2 seconds.     Comments: Small psoriasis patch was noted on the right knee joint and in the gluteal cleft.  Neurological:     Mental Status: She is alert and oriented to person, place, and time.  Psychiatric:        Behavior: Behavior normal.      Musculoskeletal Exam: C-spine was in good range of motion.  Shoulder joints, elbow joints, wrist joints, MCPs PIPs and DIPs with good range  of motion with no synovitis.  Hip joints, knee joints, ankles, MTPs and PIPs with good range of motion with no synovitis. CDAI Exam: CDAI Score: -- Patient Global: --; Provider Global: -- Swollen: --; Tender: -- Joint Exam 12/18/2020   No joint exam has been documented for this visit   There is currently no information documented on the homunculus. Go to the Rheumatology activity and complete the homunculus joint exam.  Investigation: No additional findings.  Imaging: No results found.  Recent Labs: Lab Results  Component Value Date   WBC 5.0 12/12/2019   HGB 11.7 12/12/2019   PLT 220 12/12/2019   NA 140 08/04/2020   K 4.2 08/04/2020   CL 103 08/04/2020   CO2 23 08/04/2020   GLUCOSE 71 08/04/2020   BUN 13 08/04/2020   CREATININE 0.90 08/04/2020   BILITOT 0.2 12/12/2019   ALKPHOS 105 12/12/2019  AST 25 12/12/2019   ALT 13 12/12/2019   PROT 7.2 12/12/2019   ALBUMIN 4.5 12/12/2019   CALCIUM 9.6 08/04/2020   GFRAA 73 08/04/2020    Speciality Comments: No specialty comments available.  Procedures:  No procedures performed Allergies: Etodolac   Assessment / Plan:     Visit Diagnoses: Psoriatic arthritis (HCC)-her psoriatic arthritis is very well controlled on [place].  She had no synovitis on my examination today.  She has been tolerating [place] well.  Other psoriasis-she had couple of small psoriasis patches on examination.  She has a topical agent which she can use.  She states the symptoms get better during the summertime.  High risk medications (not anticoagulants) long-term use - Otezla 30 mg 1 tablet by mouth twice daily.  She has had labs with Dr. Jorja Loa in the past.  She has an appointment coming up with Dr. Franky Macho in March.  I have advised her to forward labs to me.  Primary osteoarthritis of left knee-currently not symptomatic.  Osteopenia of multiple sites - DEXA 07/18/2020 The BMD measured at Forearm Radius 33% is 0.570 g/cm2 with a T-score of -2.0.  Use of  calcium rich diet and resistive exercises were emphasized.  Vitamin D deficiency-she takes vitamin D supplement.  Other insomnia-good sleep hygiene was discussed.  History of anemia  History of hyperlipidemia  Orders: No orders of the defined types were placed in this encounter.  No orders of the defined types were placed in this encounter.   Follow-Up Instructions: Return in about 6 months (around 06/20/2021) for Psoriatic arthritis.   Pollyann Savoy, MD  Note - This record has been created using Animal nutritionist.  Chart creation errors have been sought, but may not always  have been located. Such creation errors do not reflect on  the standard of medical care.

## 2020-12-18 ENCOUNTER — Ambulatory Visit: Payer: Medicare Other | Admitting: Rheumatology

## 2020-12-18 ENCOUNTER — Encounter: Payer: Self-pay | Admitting: Rheumatology

## 2020-12-18 ENCOUNTER — Other Ambulatory Visit: Payer: Self-pay

## 2020-12-18 VITALS — BP 123/73 | HR 52 | Ht 62.0 in | Wt 150.6 lb

## 2020-12-18 DIAGNOSIS — M25462 Effusion, left knee: Secondary | ICD-10-CM

## 2020-12-18 DIAGNOSIS — E559 Vitamin D deficiency, unspecified: Secondary | ICD-10-CM

## 2020-12-18 DIAGNOSIS — Z79899 Other long term (current) drug therapy: Secondary | ICD-10-CM | POA: Diagnosis not present

## 2020-12-18 DIAGNOSIS — L408 Other psoriasis: Secondary | ICD-10-CM

## 2020-12-18 DIAGNOSIS — M1712 Unilateral primary osteoarthritis, left knee: Secondary | ICD-10-CM | POA: Diagnosis not present

## 2020-12-18 DIAGNOSIS — L4050 Arthropathic psoriasis, unspecified: Secondary | ICD-10-CM | POA: Diagnosis not present

## 2020-12-18 DIAGNOSIS — Z8639 Personal history of other endocrine, nutritional and metabolic disease: Secondary | ICD-10-CM

## 2020-12-18 DIAGNOSIS — Z862 Personal history of diseases of the blood and blood-forming organs and certain disorders involving the immune mechanism: Secondary | ICD-10-CM

## 2020-12-18 DIAGNOSIS — G4709 Other insomnia: Secondary | ICD-10-CM

## 2020-12-18 DIAGNOSIS — M8589 Other specified disorders of bone density and structure, multiple sites: Secondary | ICD-10-CM

## 2021-01-09 ENCOUNTER — Encounter: Payer: Self-pay | Admitting: Family Medicine

## 2021-01-09 LAB — BASIC METABOLIC PANEL
BUN/Creatinine Ratio: 11 — ABNORMAL LOW (ref 12–28)
BUN: 11 mg/dL (ref 8–27)
CO2: 22 mmol/L (ref 20–29)
Calcium: 9.6 mg/dL (ref 8.7–10.3)
Chloride: 105 mmol/L (ref 96–106)
Creatinine, Ser: 0.98 mg/dL (ref 0.57–1.00)
Glucose: 81 mg/dL (ref 65–99)
Potassium: 4.4 mmol/L (ref 3.5–5.2)
Sodium: 143 mmol/L (ref 134–144)
eGFR: 61 mL/min/{1.73_m2} (ref >59)

## 2021-01-09 LAB — HEPATIC FUNCTION PANEL
ALT: 16 IU/L (ref 0–32)
AST: 33 IU/L (ref 0–40)
Albumin: 4.6 g/dL (ref 3.7–4.7)
Alkaline Phosphatase: 105 IU/L (ref 44–121)
Bilirubin Total: 0.3 mg/dL (ref 0.0–1.2)
Bilirubin, Direct: 0.11 mg/dL (ref 0.00–0.40)
Total Protein: 7.2 g/dL (ref 6.0–8.5)

## 2021-01-09 LAB — CBC WITH DIFFERENTIAL/PLATELET
Basophils Absolute: 0 10*3/uL (ref 0.0–0.2)
Basos: 1 %
EOS (ABSOLUTE): 0.1 10*3/uL (ref 0.0–0.4)
Eos: 1 %
Hematocrit: 36.7 % (ref 34.0–46.6)
Hemoglobin: 12.1 g/dL (ref 11.1–15.9)
Immature Grans (Abs): 0 10*3/uL (ref 0.0–0.1)
Immature Granulocytes: 0 %
Lymphocytes Absolute: 3.3 10*3/uL — ABNORMAL HIGH (ref 0.7–3.1)
Lymphs: 51 %
MCH: 24.2 pg — ABNORMAL LOW (ref 26.6–33.0)
MCHC: 33 g/dL (ref 31.5–35.7)
MCV: 74 fL — ABNORMAL LOW (ref 79–97)
Monocytes Absolute: 0.5 10*3/uL (ref 0.1–0.9)
Monocytes: 7 %
Neutrophils Absolute: 2.6 10*3/uL (ref 1.4–7.0)
Neutrophils: 40 %
Platelets: 204 10*3/uL (ref 150–450)
RBC: 4.99 x10E6/uL (ref 3.77–5.28)
RDW: 14.5 % (ref 11.7–15.4)
WBC: 6.5 10*3/uL (ref 3.4–10.8)

## 2021-01-09 LAB — FERRITIN: Ferritin: 54 ng/mL (ref 15–150)

## 2021-01-12 ENCOUNTER — Telehealth: Payer: Self-pay

## 2021-01-12 NOTE — Telephone Encounter (Signed)
Patient left a message with her PCP office to confirm Dr. Corliss Skains is able to see her labwork results that was completed on 01/08/21 which is in Epic.  Please advise.

## 2021-01-12 NOTE — Telephone Encounter (Signed)
Error

## 2021-01-12 NOTE — Telephone Encounter (Signed)
I called patient, we can see the labs.

## 2021-02-02 NOTE — Progress Notes (Addendum)
Subjective:   [name] is a 75 y.o. female who presents for Medicare Annual (Subsequent) preventive examination.  I connected with Jeena Arnett today by telephone and verified that I am speaking with the correct person using two identifiers. Location patient: home Location provider: work Persons participating in the virtual visit: patient, provider.   I discussed the limitations, risks, security and privacy concerns of performing an evaluation and management service by telephone and the availability of in person appointments. I also discussed with the patient that there may be a patient responsible charge related to this service. The patient expressed understanding and verbally consented to this telephonic visit.    Interactive audio and video telecommunications were attempted between this provider and patient, however failed, due to patient having technical difficulties OR patient did not have access to video capability.  We continued and completed visit with audio only.       Review of Systems    N/A  Cardiac Risk Factors include: advanced age (>69men, >19 women)     Objective:    There were no vitals filed for this visit. There is no height or weight on file to calculate BMI.  Advanced Directives 02/03/2021 11/09/2019 09/21/2018 06/30/2016 08/25/2011  Does Patient Have a Medical Advance Directive? No No No Yes Patient does not have advance directive;Patient would not like information  Type of Advance Directive - - - Living will;Healthcare Power of Attorney -  Does patient want to make changes to medical advance directive? - - - No - Patient declined -  Copy of Healthcare Power of Attorney in Chart? - - - No - copy requested -  Would patient like information on creating a medical advance directive? No - Patient declined No - Patient declined No - Patient declined - -  Pre-existing out of facility DNR order (yellow form or pink MOST form) - - - - No    Current  Medications (verified) Outpatient Encounter Medications as of 02/03/2021  Medication Sig   Ascorbic Acid (VITAMIN C PO) Take by mouth.   Cholecalciferol (VITAMIN D3) 50 MCG (2000 UT) TABS Take 2,000 Units by mouth every evening.   Multiple Vitamin (MULTIVITAMIN) tablet Take 1 tablet by mouth daily.   OTEZLA 30 MG TABS TAKE 1 TABLET BY MOUTH TWICE DAILY   rosuvastatin (CRESTOR) 5 MG tablet TAKE 1 TABLET BY MOUTH ONCE A DAY.   ALPRAZolam (XANAX) 0.5 MG tablet 1/2 to 1 qhs prn insomnia infrequent use please (Patient not taking: Reported on 02/03/2021)   No facility-administered encounter medications on file as of 02/03/2021.    Allergies (verified) Etodolac   History: Past Medical History:  Diagnosis Date   Hx of colonic polyps    Hypercholesterolemia    Psoriatic arthritis (HCC)    Past Surgical History:  Procedure Laterality Date   ABDOMINAL HYSTERECTOMY     COLONOSCOPY  08/25/2011   Procedure: COLONOSCOPY;  Surgeon: Malissa Hippo, MD;  [facility];  Service: Endoscopy;  Laterality: N/A;  12:00   COLONOSCOPY N/A 09/21/2018   Procedure: COLONOSCOPY;  Surgeon: Malissa Hippo, MD;  [facility];  Service: Endoscopy;  Laterality: N/A;  830   left knee stable out  1966   left knee staple in  1962   VESICOVAGINAL FISTULA CLOSURE W/ TAH  1986   Family History  Problem Relation Age of Onset   Anesthesia problems Mother    Cancer Mother    Hypertension Mother    Breast cancer  Mother    Cancer Father    Diabetes Father    Deep vein thrombosis Father    Hypertension Father    Arthritis Other        FH   Diabetes Other        FH   Cancer Other        FH   Hypertension Sister    Hypertension Brother    Heart disease Brother    Migraines Sister    Migraines Sister    Hypotension Neg Hx    Malignant hyperthermia Neg Hx    Pseudochol deficiency Neg Hx    Social History   Socioeconomic History   Marital status: Widowed    Spouse name: Not on file    Number of children: Not on file   Years of education: Not on file   Highest education level: Not on file  Occupational History   Not on file  Tobacco Use   Smoking status: Never Smoker   Smokeless tobacco: Never Used  Vaping Use   Vaping Use: Never used  Substance and Sexual Activity   Alcohol use: No   Drug use: No   Sexual activity: Not on file  Other Topics Concern   Not on file  Social History Narrative   Not on file   Social Determinants of Health   Financial Resource Strain: Low Risk    Difficulty of Paying Living Expenses: Not hard at all  Food Insecurity: No Food Insecurity   Worried About Running Out of Food in the Last Year: Never true   Ran Out of Food in the Last Year: Never true  Transportation Needs: No Transportation Needs   Lack of Transportation (Medical): No   Lack of Transportation (Non-Medical): No  Physical Activity: Inactive   Days of Exercise per Week: 0 days   Minutes of Exercise per Session: 0 min  Stress: No Stress Concern Present   Feeling of Stress : Not at all  Social Connections: Moderately Integrated   Frequency of Communication with Friends and Family: More than three times a week   Frequency of Social Gatherings with Friends and Family: More than three times a week   Attends Religious Services: More than 4 times per year   Active Member of [employer] or Organizations: Yes   Attends Banker Meetings: More than 4 times per year   Marital Status: Widowed    Tobacco Counseling Counseling given: Not Answered   Clinical Intake:  Pre-visit preparation completed: Yes  Pain : No/denies pain     Nutritional Risks: None Diabetes: No  How often do you need to have someone help you when you read instructions, pamphlets, or other written materials from your doctor or pharmacy?: 1 - Never  Diabetic?No   Interpreter Needed?: No  Information entered by :: SCrews,LPN   Activities of Daily Living In your present state of  health, do you have any difficulty performing the following activities: 02/03/2021  Hearing? N  Vision? N  Difficulty concentrating or making decisions? N  Walking or climbing stairs? N  Dressing or bathing? N  Doing errands, shopping? N  Preparing Food and eating ? N  Using the Toilet? N  In the past six months, have you accidently leaked urine? N  Do you have problems with loss of bowel control? N  Managing your Medications? N  Managing your Finances? N  Housekeeping or managing your Housekeeping? N  Some recent data might be hidden    Patient  Care Team: Babs SciaraLuking, Scott A, MD as PCP - General (Family Medicine)  Indicate any recent Medical Services you may have received from other than [facility] providers in the past year (date may be approximate).     Assessment:   This is a routine wellness examination for Santina EvansCatherine.  Hearing/Vision screen  Hearing Screening   125Hz  250Hz  500Hz  1000Hz  2000Hz  3000Hz  4000Hz  6000Hz  8000Hz   Right ear:           Left ear:           Vision Screening Comments: Patient wears glasses, states she gets her eye examined once per year   Dietary issues and exercise activities discussed: Current Exercise Habits: The patient does not participate in regular exercise at present, Exercise limited by: None identified  Goals Addressed             This Visit's Progress    DIET - EAT MORE FRUITS AND VEGETABLES       DIET - REDUCE SUGAR INTAKE         Depression Screen PHQ 2/9 Scores 02/03/2021 06/19/2019 05/30/2018 05/12/2017 03/30/2016 12/27/2014  PHQ - 2 Score 0 1 1 1  0 0    Fall Risk Fall Risk  02/03/2021 12/14/2019 05/30/2018 05/12/2017 03/30/2016  Falls in the past year? 0 0 No No No  Number falls in past yr: 0 - - - -  Injury with Fall? 0 - - - -  Risk for fall due to : No Fall Risks - - - -  Follow up Falls evaluation completed;Falls prevention discussed Falls evaluation completed - - -    FALL RISK PREVENTION PERTAINING TO THE HOME:  Any stairs in or  around the home? Yes  If so, are there any without handrails? No  Home free of loose throw rugs in walkways, pet beds, electrical cords, etc? Yes  Adequate lighting in your home to reduce risk of falls? Yes   ASSISTIVE DEVICES UTILIZED TO PREVENT FALLS:  Life alert? No  Use of a cane, walker or w/c? No  Grab bars in the bathroom? Yes  Shower chair or bench in shower? No  Elevated toilet seat or a handicapped toilet? Yes     Cognitive Function:   Normal cognitive status assessed by direct observation by this Nurse Health Advisor. No abnormalities found.        Immunizations Immunization History  Administered Date(s) Administered   Fluad Quad(high Dose 65+) 10/02/2020   Influenza,inj,Quad PF,6+ Mos 06/19/2019   Influenza-Unspecified 07/13/2016, 08/02/2018   Moderna Sars-Covid-2 Vaccination 11/28/2019, 12/26/2019, 08/26/2020   PPD Test 11/06/2019   Pneumococcal Conjugate-13 12/27/2014   Pneumococcal Polysaccharide-23 03/30/2016   Pneumococcal-Unspecified 11/06/2009    TDAP status: Due, Education has been provided regarding the importance of this vaccine. Advised may receive this vaccine at local pharmacy or Health Dept. Aware to provide a copy of the vaccination record if obtained from local pharmacy or Health Dept. Verbalized acceptance and understanding.  Flu Vaccine status: Up to date  Pneumococcal vaccine status: Up to date  Covid-19 vaccine status: Completed vaccines  Qualifies for Shingles Vaccine? Yes   Zostavax completed No   Shingrix Completed?: No.    Education has been provided regarding the importance of this vaccine. Patient has been advised to call insurance company to determine out of pocket expense if they have not yet received this vaccine. Advised may also receive vaccine at local pharmacy or Health Dept. Verbalized acceptance and understanding.  Screening Tests Health Maintenance  Topic Date Due  TETANUS/TDAP  02/22/2021 (Originally 09/24/1965)    INFLUENZA VACCINE  05/04/2021   MAMMOGRAM  08/06/2021   DEXA SCAN  Completed   COVID-19 Vaccine  Completed   Hepatitis C Screening  Completed   PNA vac Low Risk Adult  Completed   HPV VACCINES  Aged Out    Health Maintenance  There are no preventive care reminders to display for this patient.  Colorectal cancer screening: No longer required.   Mammogram status: Completed 08/06/2020. Repeat every year  Bone Density status: Completed 07/18/2020. Results reflect: Bone density results: OSTEOPENIA. Repeat every 5 years.  Lung Cancer Screening: (Low Dose CT Chest recommended if Age 6-80 years, 30 pack-year currently smoking OR have quit w/in 15years.) does not qualify.   Lung Cancer Screening Referral: N/A  Additional Screening:  Hepatitis C Screening: does qualify; Completed 01/05/2017  Vision Screening: Recommended annual ophthalmology exams for early detection of glaucoma and other disorders of the eye. Is the patient up to date with their annual eye exam?  Yes  Who is the provider or what is the name of the office in which the patient attends annual eye exams? [facility]  If pt is not established with a provider, would they like to be referred to a provider to establish care? No .   Dental Screening: Recommended annual dental exams for proper oral hygiene  Community Resource Referral / Chronic Care Management: CRR required this visit?  No   CCM required this visit?  No      Plan:     I have personally reviewed and noted the following in the patient's chart:   Medical and social history Use of alcohol, tobacco or illicit drugs  Current medications and supplements including opioid prescriptions.  Functional ability and status Nutritional status Physical activity Advanced directives List of other physicians Hospitalizations, surgeries, and ER visits in previous 12 months Vitals Screenings to include cognitive, depression, and falls Referrals and  appointments  In addition, I have reviewed and discussed with patient certain preventive protocols, quality metrics, and best practice recommendations. A written personalized care plan for preventive services as well as general preventive health recommendations were provided to patient.     Theodora Blow, LPN   05/08/5363   Nurse Notes: None  I have reviewed and agree with the above annual wellness visit documentation Lilyan Punt MD [facility]

## 2021-02-03 ENCOUNTER — Telehealth: Payer: Self-pay | Admitting: Family Medicine

## 2021-02-03 ENCOUNTER — Ambulatory Visit (INDEPENDENT_AMBULATORY_CARE_PROVIDER_SITE_OTHER): Payer: Medicare Other

## 2021-02-03 ENCOUNTER — Ambulatory Visit: Payer: Medicare Other | Admitting: Family Medicine

## 2021-02-03 ENCOUNTER — Other Ambulatory Visit: Payer: Self-pay

## 2021-02-03 DIAGNOSIS — Z0000 Encounter for general adult medical examination without abnormal findings: Secondary | ICD-10-CM | POA: Diagnosis not present

## 2021-02-03 NOTE — Telephone Encounter (Signed)
During medicare wellness visit patient asked if a lipid panel could be ordered as she stated that it was not included with her most recent labs that were drawn on 01/08/2021. Please advise?

## 2021-02-03 NOTE — Patient Instructions (Signed)
[name] , Thank you for taking time to come for your Medicare Wellness Visit. I appreciate your ongoing commitment to your health goals. Please review the following plan we discussed and let me know if I can assist you in the future.   Screening recommendations/referrals: Colonoscopy: No longer required  Mammogram: Up to date, next due 08/06/2021 Bone Density: Up to date, next due 07/18/2025 Recommended yearly ophthalmology/optometry visit for glaucoma screening and checkup Recommended yearly dental visit for hygiene and checkup  Vaccinations: Influenza vaccine: Up to date, next due fall 2022  Pneumococcal vaccine: Completed series  Tdap vaccine: Currently due, you may await and injury to receive  Shingles vaccine: Currently due for Shingrix, if you would like to receive we recommend that you do so at your local pharmacy.     Advanced directives: Please bring in copies of your advanced medical directives so that we may scan them into your chart:  Conditions/risks identified: None   Next appointment: 02/09/2022 @ 11:00 am via Telephone with Nurse Health Advisor    Preventive Care 65 Years and Older, Female Preventive care refers to lifestyle choices and visits with your health care provider that can promote health and wellness. What does preventive care include?  A yearly physical exam. This is also called an annual well check.  Dental exams once or twice a year.  Routine eye exams. Ask your health care provider how often you should have your eyes checked.  Personal lifestyle choices, including:  Daily care of your teeth and gums.  Regular physical activity.  Eating a healthy diet.  Avoiding tobacco and drug use.  Limiting alcohol use.  Practicing safe sex.  Taking low-dose aspirin every day.  Taking vitamin and mineral supplements as recommended by your health care provider. What happens during an annual well check? The services and screenings done by your  health care provider during your annual well check will depend on your age, overall health, lifestyle risk factors, and family history of disease. Counseling  Your health care provider may ask you questions about your:  Alcohol use.  Tobacco use.  Drug use.  Emotional well-being.  Home and relationship well-being.  Sexual activity.  Eating habits.  History of falls.  Memory and ability to understand (cognition).  Work and work Astronomer.  Reproductive health. Screening  You may have the following tests or measurements:  Height, weight, and BMI.  Blood pressure.  Lipid and cholesterol levels. These may be checked every 5 years, or more frequently if you are over 58 years old.  Skin check.  Lung cancer screening. You may have this screening every year starting at age 42 if you have a 30-pack-year history of smoking and currently smoke or have quit within the past 15 years.  Fecal occult blood test (FOBT) of the stool. You may have this test every year starting at age 78.  Flexible sigmoidoscopy or colonoscopy. You may have a sigmoidoscopy every 5 years or a colonoscopy every 10 years starting at age 68.  Hepatitis C blood test.  Hepatitis B blood test.  Sexually transmitted disease (STD) testing.  Diabetes screening. This is done by checking your blood sugar (glucose) after you have not eaten for a while (fasting). You may have this done every 1-3 years.  Bone density scan. This is done to screen for osteoporosis. You may have this done starting at age 74.  Mammogram. This may be done every 1-2 years. Talk to your health care provider about how often you  should have regular mammograms. Talk with your health care provider about your test results, treatment options, and if necessary, the need for more tests. Vaccines  Your health care provider may recommend certain vaccines, such as:  Influenza vaccine. This is recommended every year.  Tetanus, diphtheria, and  acellular pertussis (Tdap, Td) vaccine. You may need a Td booster every 10 years.  Zoster vaccine. You may need this after age 69.  Pneumococcal 13-valent conjugate (PCV13) vaccine. One dose is recommended after age 32.  Pneumococcal polysaccharide (PPSV23) vaccine. One dose is recommended after age 67. Talk to your health care provider about which screenings and vaccines you need and how often you need them. This information is not intended to replace advice given to you by your health care provider. Make sure you discuss any questions you have with your health care provider. Document Released: 10/17/2015 Document Revised: 06/09/2016 Document Reviewed: 07/22/2015 Elsevier Interactive Patient Education  2017 [employer] Prevention in the Home Falls can cause injuries. They can happen to people of all ages. There are many things you can do to make your home safe and to help prevent falls. What can I do on the outside of my home?  Regularly fix the edges of walkways and driveways and fix any cracks.  Remove anything that might make you trip as you walk through a door, such as a raised step or threshold.  Trim any bushes or trees on the path to your home.  Use bright outdoor lighting.  Clear any walking paths of anything that might make someone trip, such as rocks or tools.  Regularly check to see if handrails are loose or broken. Make sure that both sides of any steps have handrails.  Any raised decks and porches should have guardrails on the edges.  Have any leaves, snow, or ice cleared regularly.  Use sand or salt on walking paths during winter.  Clean up any spills in your garage right away. This includes oil or grease spills. What can I do in the bathroom?  Use night lights.  Install grab bars by the toilet and in the tub and shower. Do not use towel bars as grab bars.  Use non-skid mats or decals in the tub or shower.  If you need to sit down in the shower, use  a plastic, non-slip stool.  Keep the floor dry. Clean up any water that spills on the floor as soon as it happens.  Remove soap buildup in the tub or shower regularly.  Attach bath mats securely with double-sided non-slip rug tape.  Do not have throw rugs and other things on the floor that can make you trip. What can I do in the bedroom?  Use night lights.  Make sure that you have a light by your bed that is easy to reach.  Do not use any sheets or blankets that are too big for your bed. They should not hang down onto the floor.  Have a firm chair that has side arms. You can use this for support while you get dressed.  Do not have throw rugs and other things on the floor that can make you trip. What can I do in the kitchen?  Clean up any spills right away.  Avoid walking on wet floors.  Keep items that you use a lot in easy-to-reach places.  If you need to reach something above you, use a strong step stool that has a grab bar.  Keep electrical cords out  of the way.  Do not use floor polish or wax that makes floors slippery. If you must use wax, use non-skid floor wax.  Do not have throw rugs and other things on the floor that can make you trip. What can I do with my stairs?  Do not leave any items on the stairs.  Make sure that there are handrails on both sides of the stairs and use them. Fix handrails that are broken or loose. Make sure that handrails are as long as the stairways.  Check any carpeting to make sure that it is firmly attached to the stairs. Fix any carpet that is loose or worn.  Avoid having throw rugs at the top or bottom of the stairs. If you do have throw rugs, attach them to the floor with carpet tape.  Make sure that you have a light switch at the top of the stairs and the bottom of the stairs. If you do not have them, ask someone to add them for you. What else can I do to help prevent falls?  Wear shoes that:  Do not have high heels.  Have  rubber bottoms.  Are comfortable and fit you well.  Are closed at the toe. Do not wear sandals.  If you use a stepladder:  Make sure that it is fully opened. Do not climb a closed stepladder.  Make sure that both sides of the stepladder are locked into place.  Ask someone to hold it for you, if possible.  Clearly mark and make sure that you can see:  Any grab bars or handrails.  First and last steps.  Where the edge of each step is.  Use tools that help you move around (mobility aids) if they are needed. These include:  Canes.  Walkers.  Scooters.  Crutches.  Turn on the lights when you go into a dark area. Replace any light bulbs as soon as they burn out.  Set up your furniture so you have a clear path. Avoid moving your furniture around.  If any of your floors are uneven, fix them.  If there are any pets around you, be aware of where they are.  Review your medicines with your doctor. Some medicines can make you feel dizzy. This can increase your chance of falling. Ask your doctor what other things that you can do to help prevent falls. This information is not intended to replace advice given to you by your health care provider. Make sure you discuss any questions you have with your health care provider. Document Released: 07/17/2009 Document Revised: 02/26/2016 Document Reviewed: 10/25/2014 Elsevier Interactive Patient Education  2017 [employer].

## 2021-02-04 NOTE — Telephone Encounter (Signed)
Please order lipid profile.  If patient desires any other test please let us know

## 2021-02-04 NOTE — Telephone Encounter (Signed)
Tried calling and no answer

## 2021-02-06 ENCOUNTER — Other Ambulatory Visit: Payer: Self-pay | Admitting: *Deleted

## 2021-02-06 DIAGNOSIS — E785 Hyperlipidemia, unspecified: Secondary | ICD-10-CM

## 2021-02-06 NOTE — Telephone Encounter (Signed)
Pt just wanted lipid and order put in for pt to do one week before appt in july

## 2021-04-14 ENCOUNTER — Ambulatory Visit: Payer: Medicare Other | Admitting: Family Medicine

## 2021-05-13 LAB — LIPID PANEL
Chol/HDL Ratio: 2.5 ratio (ref 0.0–4.4)
Cholesterol, Total: 186 mg/dL (ref 100–199)
HDL: 74 mg/dL (ref >39)
LDL Chol Calc (NIH): 96 mg/dL (ref 0–99)
Triglycerides: 87 mg/dL (ref 0–149)
VLDL Cholesterol Cal: 16 mg/dL (ref 5–40)

## 2021-05-14 ENCOUNTER — Other Ambulatory Visit: Payer: Self-pay

## 2021-05-14 NOTE — Telephone Encounter (Signed)
Patient called stating her left knee is a little swollen and stiff for the last 3 days. Patient states she has been lightly jogging in place a couple of times/day for almost 1 week to get her steps in and thinks she might have done too much.  Patient states she doesn't have a lot of pain but has warmth to the left knee. Patient states she is on Otzela and takes as prescribed. Patient states she has been using ice and elevation. Patient states it helps for a short time but when she resumes activities it swells again. Please advise.

## 2021-05-14 NOTE — Telephone Encounter (Signed)
Ok to send in prednisone taper starting at 20 mg tapering by 5 mg every 2 days. She should schedule a sooner visit with [place] or her orthopedist if her discomfort and swelling persist or worsen.  She should avoid NSAIDs while taking prednisone.  She should take prednisone with food in the mornings.

## 2021-05-14 NOTE — Telephone Encounter (Signed)
Patient called stating her left knee is a little swollen and stiff.  Patient states she has been lightly jogging in place a couple of times/day and thinks she might have done too much.  Patient states she doesn't have a lot of pain, but requested a return call to let her know if she should just stay off it or if she needs to be seen.

## 2021-05-15 MED ORDER — PREDNISONE 5 MG PO TABS: ORAL_TABLET | ORAL | 0 refills | Status: DC

## 2021-05-15 NOTE — Telephone Encounter (Signed)
Patient advised prescription to be sent in for prednisone taper starting at 20 mg tapering by 5 mg every 2 days. Patient advised she should schedule a sooner visit with [place] or her orthopedist if her discomfort and swelling persist or worsen.   Patient advised she should avoid NSAIDs while taking prednisone.  She should take prednisone with food in the mornings. Patient expressed understanding.

## 2021-05-18 ENCOUNTER — Encounter: Payer: Self-pay | Admitting: Family Medicine

## 2021-05-18 ENCOUNTER — Other Ambulatory Visit: Payer: Self-pay

## 2021-05-18 ENCOUNTER — Ambulatory Visit (INDEPENDENT_AMBULATORY_CARE_PROVIDER_SITE_OTHER): Payer: Medicare Other | Admitting: Family Medicine

## 2021-05-18 VITALS — BP 118/62 | HR 83 | Ht 62.0 in | Wt 148.0 lb

## 2021-05-18 DIAGNOSIS — Z79899 Other long term (current) drug therapy: Secondary | ICD-10-CM

## 2021-05-18 DIAGNOSIS — Z862 Personal history of diseases of the blood and blood-forming organs and certain disorders involving the immune mechanism: Secondary | ICD-10-CM | POA: Diagnosis not present

## 2021-05-18 DIAGNOSIS — E785 Hyperlipidemia, unspecified: Secondary | ICD-10-CM | POA: Diagnosis not present

## 2021-05-18 DIAGNOSIS — E559 Vitamin D deficiency, unspecified: Secondary | ICD-10-CM

## 2021-05-18 MED ORDER — ROSUVASTATIN CALCIUM 5 MG PO TABS
5.0000 mg | ORAL_TABLET | Freq: Every day | ORAL | 1 refills | Status: DC
Start: 2021-05-18 — End: 2022-02-22

## 2021-05-18 NOTE — Patient Instructions (Signed)

## 2021-05-18 NOTE — Progress Notes (Signed)
   Subjective:    Patient ID: [name], female    DOB: Nov 02, 1945, 75 y.o.   MRN: [id number]  Hyperlipidemia This is a chronic problem. The current episode started more than 1 year ago. Treatments tried: crestor. There are no compliance problems.  Risk factors for coronary artery disease include dyslipidemia.   Patient states her psoriasis is staying stable with her current medication she is followed by specialist.  She is interested in getting some lab work this fall to look at her vitamin D and CBC she has a history of anemia and vitamin D deficiency.  Review of Systems     Objective:   Physical Exam  General-in no acute distress Eyes-no discharge Lungs-respiratory rate normal, CTA CV-no murmurs,RRR Extremities skin warm dry no edema Neuro grossly normal Behavior normal, alert       Assessment & Plan:  1. Hyperlipidemia, unspecified hyperlipidemia type Continue medication watch diet check lab - Hepatic function panel - Basic metabolic panel  2. Vitamin D deficiency Takes 2000 units vitamin D daily.  Check labs - VITAMIN D 25 Hydroxy (Vit-D Deficiency, Fractures)  3. History of microcytic hypochromic anemia Some history of anemia patient gives blood twice a year she would like to have her CBC checked - CBC with Differential/Platelet - Hepatic function panel  4. High risk medication use Check kidney function. - Hepatic function panel - Basic metabolic panel  She will do the lab work in October she will follow-up office visit in 6 months

## 2021-06-04 NOTE — Progress Notes (Signed)
Office Visit Note  Patient: [name]             Date of Birth: 04-20-1946           MRN: [id number]             PCP: Babs Sciara, MD Referring: Babs Sciara, MD Visit Date: 06/18/2021 Occupation: @GUAROCC @  Subjective:  Right foot pain   History of Present Illness: [name] is a 75 y.o. female with a history of psoriatic arthritis, osteoarthritis, and osteopenia.  Patient is prescribed Otezla 30 mg 1 tablet by mouth twice daily. She has missed 1 week of otezla due to waiting for her shipment to arrive.  She states that about 1 month ago she was experiencing increased pain in the left knee joint.  She states she has been running in place while exercising which likely exacerbated her discomfort.  A prednisone taper was sent to the pharmacy on 05/14/2021 but she did not end up taking the taper since her symptoms improved.  She states that she has still been trying to alter her physical activities in order to prevent the knee from flaring back up.  She no longer is going to line dancing classes.  She states that on Saturday she was on about a 40-minute walk and started to have severe pain in her right foot.  She states that she was having difficulty ambulating and had to use crutches later on that day.  She states that by Sunday the pain had resolved.  She denies any swelling or bruising at that time.  She states that she did not have any injury and that the pain was primarily in her midfoot.  She denies any Achilles tendinitis or plantar fasciitis.  She denies any SI joint pain.  She denies any other joint pain or joint swelling at this time.     Activities of Daily Living:  Patient reports morning stiffness for 0 minutes.   Patient Denies nocturnal pain.  Difficulty dressing/grooming: Denies Difficulty climbing stairs: Denies Difficulty getting out of chair: Denies Difficulty using hands for taps, buttons, cutlery, and/or writing: Denies  Review of Systems   Constitutional:  Negative for fatigue.  HENT:  Negative for mouth sores, mouth dryness and nose dryness.   Eyes:  Negative for pain, itching and dryness.  Respiratory:  Negative for shortness of breath and difficulty breathing.   Cardiovascular:  Negative for chest pain and palpitations.  Gastrointestinal:  Negative for blood in stool, constipation and diarrhea.  Endocrine: Negative for increased urination.  Genitourinary:  Negative for difficulty urinating.  Musculoskeletal:  Negative for joint pain, joint pain, joint swelling, myalgias, morning stiffness, muscle tenderness and myalgias.  Skin:  Negative for color change, rash and redness.  Allergic/Immunologic: Negative for susceptible to infections.  Neurological:  Negative for dizziness, numbness, headaches, memory loss and weakness.  Hematological:  Negative for bruising/bleeding tendency.  Psychiatric/Behavioral:  Negative for confusion.    PMFS History:  Patient Active Problem List   Diagnosis Date Noted   History of colonic polyps 07/04/2018   History of hyperlipidemia 02/09/2017   Other psoriasis 12/30/2016   High risk medications (not anticoagulants) long-term use 12/30/2016   Psoriatic arthritis (HCC) 12/27/2014   Vitamin D deficiency 08/06/2013   Osteopenia 08/06/2013   Insomnia 02/22/2013   Hyperlipidemia 01/09/2013   Anemia 09/12/2012   Toe cyanosis 08/08/2012   Osteoarthritis of shoulder 09/15/2010    Past Medical History:  Diagnosis Date   Hx  of colonic polyps    Hypercholesterolemia    Psoriatic arthritis (HCC)     Family History  Problem Relation Age of Onset   Anesthesia problems Mother    Cancer Mother    Hypertension Mother    Breast cancer Mother    Cancer Father    Diabetes Father    Deep vein thrombosis Father    Hypertension Father    Arthritis Other        FH   Diabetes Other        FH   Cancer Other        FH   Hypertension Sister    Hypertension Brother    Heart disease Brother     Migraines Sister    Migraines Sister    Hypotension Neg Hx    Malignant hyperthermia Neg Hx    Pseudochol deficiency Neg Hx    Past Surgical History:  Procedure Laterality Date   ABDOMINAL HYSTERECTOMY     COLONOSCOPY  08/25/2011   Procedure: COLONOSCOPY;  Surgeon: Malissa Hippo, MD;  [facility];  Service: Endoscopy;  Laterality: N/A;  12:00   COLONOSCOPY N/A 09/21/2018   Procedure: COLONOSCOPY;  Surgeon: Malissa Hippo, MD;  [facility]: [facility];  Service: Endoscopy;  Laterality: N/A;  830   left knee stable out  1966   left knee staple in  1962   VESICOVAGINAL FISTULA CLOSURE W/ TAH  1986   Social History   Social History Narrative   Not on file   Immunization History  Administered Date(s) Administered   Fluad Quad(high Dose 65+) 10/02/2020   Influenza,inj,Quad PF,6+ Mos 06/19/2019   Influenza-Unspecified 07/13/2016, 08/02/2018   Moderna Sars-Covid-2 Vaccination 11/28/2019, 12/26/2019, 08/26/2020, 05/15/2021   PPD Test 11/06/2019   Pneumococcal Conjugate-13 12/27/2014   Pneumococcal Polysaccharide-23 03/30/2016   Pneumococcal-Unspecified 11/06/2009     Objective: Vital Signs: BP 129/73 (BP Location: Left Arm, Patient Position: Sitting, Cuff Size: Normal)   Pulse (!) 53   Ht 5\' 2"  (1.575 m)   Wt 142 lb 12.8 oz (64.8 kg)   BMI 26.12 kg/m    Physical Exam Vitals and nursing note reviewed.  Constitutional:      Appearance: She is well-developed.  HENT:     Head: Normocephalic and atraumatic.  Eyes:     Conjunctiva/sclera: Conjunctivae normal.  Pulmonary:     Effort: Pulmonary effort is normal.  Abdominal:     Palpations: Abdomen is soft.  Musculoskeletal:     Cervical back: Normal range of motion.  Skin:    General: Skin is warm and dry.     Capillary Refill: Capillary refill takes less than 2 seconds.  Neurological:     Mental Status: She is alert and oriented to person, place, and time.  Psychiatric:        Behavior: Behavior normal.      Musculoskeletal Exam: C-spine, thoracic spine, lumbar spine good range of motion with no discomfort.  No midline spinal tenderness or SI joint tenderness.  Shoulder joints, elbow joints, wrist joints, MCPs, PIPs, DIPs have good range of motion with no synovitis.  PIP and DIP thickening noted.  Complete fist formation bilaterally.  Hip joints have good range of motion with no discomfort.  Knee joints have good range of motion bilaterally.  Left knee joint effusion noted.  No warmth or erythema of any joints noted.  Ankle joints have good range of motion with no tenderness or joint swelling.  No evidence of Achilles signs  or plantar fasciitis.  No tenderness over MTP joints.  CDAI Exam: CDAI Score: -- Patient Global: --; Provider Global: -- Swollen: --; Tender: -- Joint Exam 06/18/2021   No joint exam has been documented for this visit   There is currently no information documented on the homunculus. Go to the Rheumatology activity and complete the homunculus joint exam.  Investigation: No additional findings.  Imaging: No results found.  Recent Labs: Lab Results  Component Value Date   WBC 6.5 01/08/2021   HGB 12.1 01/08/2021   PLT 204 01/08/2021   NA 143 01/08/2021   K 4.4 01/08/2021   CL 105 01/08/2021   CO2 22 01/08/2021   GLUCOSE 81 01/08/2021   BUN 11 01/08/2021   CREATININE 0.98 01/08/2021   BILITOT 0.3 01/08/2021   ALKPHOS 105 01/08/2021   AST 33 01/08/2021   ALT 16 01/08/2021   PROT 7.2 01/08/2021   ALBUMIN 4.6 01/08/2021   CALCIUM 9.6 01/08/2021   GFRAA 73 08/04/2020    Speciality Comments: No specialty comments available.  Procedures:  No procedures performed Allergies: Etodolac   Assessment / Plan:     Visit Diagnoses: Psoriatic arthritis (HCC) -She has no synovitis or dactylitis on examination today.  The left knee joint has good range of motion with a small effusion but no warmth or erythema was noted.  She has no evidence of Achilles tendinitis or  plantar fasciitis.  No SI joint tenderness to palpation.  No active psoriasis was noted at this time.  At the beginning of August 2022 she started to have increased pain in the left knee joint after performing strenuous activities while at the gym.  A prednisone taper was sent to the pharmacy on 05/14/2021 but she did not end up taking this taper since her symptoms self resolved.  On Saturday she was walking for about 40 minutes and started to have severe pain in her right midfoot.  She did not have any injury prior to the onset of symptoms.  She did not notice any swelling at that time.  She was having difficulty ambulating and used a crutch for the rest of the day.  Her symptoms resolved by Sunday and have not recurred.  On examination today no inflammation or tenderness was noted.  She declined x-rays of the right foot.  She has an upcoming appointment with her podiatrist next week and we will further discuss at that time.  We discussed the importance of wearing proper fitting shoes.  She was also given a list of natural anti-inflammatories to start taking.  A sed rate will be checked today.  She will remain on Otezla 30 mg 1 tablet by mouth twice daily.  She was advised to notify us if she develops increased joint pain or joint swelling.  She will follow-up in the office in 5 months.  Plan: Sedimentation rate  Other psoriasis: She has occasional patches of psoriasis.  No active patches currently.  She will remain on otezla as prescribed.   High risk medications (not anticoagulants) long-term use - Otezla 30 mg 1 tablet by mouth twice daily.  CBC, BMP, and hepatic function panel were updated on 01/12/21.  CBC and CMP will be updated today to monitor for drug toxicity.  Lab work will be forwarded to PCP as requested by the patient. - Plan: CBC with Differential/Platelet, COMPLETE METABOLIC PANEL WITH GFR  Primary osteoarthritis of left knee: X-rays of the left knee were obtained on 04/11/2019 which were  consistent with severe  chondromalacia patella and moderate osteoarthritis.  In the beginning of August she started to have increased pain in the left knee joint which was exacerbated by running in place at the gym.  A prednisone taper was sent to the pharmacy on 05/14/2021 patient did not end up taking the taper since her symptoms resolved but she did not take the taper due to her symptoms self resolved.  She has been trying to avoid strenuous activities recently and has not had any recurrence of symptoms. Se has good ROM of the left knee joint with a small effusion but no warmth or erythema noted.  She declined updated x-rays at this time.  She declined a cortisone injection.  She was encouraged to try a compression brace.  She was also given a handout of natural anti-inflammatories to start taking including turmeric, ginger, tart cherry, and omega-3.  She was advised to notify us if her symptoms recur.  Osteopenia of multiple sites - DEXA 07/18/2020 The BMD measured at Forearm Radius 33% is 0.570 g/cm2 with a T-score of -2.0.  She is taking vitamin D 2000 units daily.  No recent falls or fractures.  She will be due to update DEXA in October 2023.  Vitamin D deficiency - She is taking vitamin D 2000 units daily.   Other medical conditions are listed as follows:   History of anemia - Patient requested to have iron panel drawn today.  Results will be forwarded to her PCP as requested. Plan: Iron, TIBC and Ferritin Panel  Other insomnia  History of hyperlipidemia  Orders: Orders Placed This Encounter  Procedures   CBC with Differential/Platelet   COMPLETE METABOLIC PANEL WITH GFR   Sedimentation rate   Iron, TIBC and Ferritin Panel   No orders of the defined types were placed in this encounter.    Follow-Up Instructions: Return in about 5 months (around 11/18/2021) for Psoriatic arthritis, Osteoarthritis.   Gearldine Bienenstockaylor M Colten Desroches, PA-C  Note - This record has been created using Dragon software.   Chart creation errors have been sought, but may not always  have been located. Such creation errors do not reflect on  the standard of medical care.

## 2021-06-11 ENCOUNTER — Other Ambulatory Visit: Payer: Self-pay

## 2021-06-11 MED ORDER — OTEZLA 30 MG PO TABS
1.0000 | ORAL_TABLET | Freq: Two times a day (BID) | ORAL | 0 refills | Status: DC
Start: 2021-06-11 — End: 2021-06-19

## 2021-06-11 NOTE — Telephone Encounter (Signed)
Next Visit: 06/18/2021  Last Visit: 12/18/2020  Last Fill:   DX: Psoriatic arthritis  Current Dose per office note 12/18/2020: Otezla 30 mg 1 tablet by mouth twice daily.  Labs: 01/08/2021 MCV 74, MCH 24.2, Lymphocytes Absolute 3.3, BUN/Creatinine Ratio 11  Okay to refill [place]?

## 2021-06-11 NOTE — Telephone Encounter (Signed)
Patient called requesting prescription refill of [name] to be sent to [facility].  Patient states she is out of medication and when she called to request a refill she was told she needed to contact our office.

## 2021-06-18 ENCOUNTER — Ambulatory Visit: Payer: Medicare Other | Admitting: Physician Assistant

## 2021-06-18 ENCOUNTER — Other Ambulatory Visit: Payer: Self-pay

## 2021-06-18 ENCOUNTER — Encounter: Payer: Self-pay | Admitting: Physician Assistant

## 2021-06-18 VITALS — BP 129/73 | HR 53 | Ht 62.0 in | Wt 142.8 lb

## 2021-06-18 DIAGNOSIS — M8589 Other specified disorders of bone density and structure, multiple sites: Secondary | ICD-10-CM

## 2021-06-18 DIAGNOSIS — L4050 Arthropathic psoriasis, unspecified: Secondary | ICD-10-CM

## 2021-06-18 DIAGNOSIS — Z8639 Personal history of other endocrine, nutritional and metabolic disease: Secondary | ICD-10-CM

## 2021-06-18 DIAGNOSIS — Z79899 Other long term (current) drug therapy: Secondary | ICD-10-CM

## 2021-06-18 DIAGNOSIS — L408 Other psoriasis: Secondary | ICD-10-CM

## 2021-06-18 DIAGNOSIS — M1712 Unilateral primary osteoarthritis, left knee: Secondary | ICD-10-CM | POA: Diagnosis not present

## 2021-06-18 DIAGNOSIS — Z862 Personal history of diseases of the blood and blood-forming organs and certain disorders involving the immune mechanism: Secondary | ICD-10-CM

## 2021-06-18 DIAGNOSIS — E559 Vitamin D deficiency, unspecified: Secondary | ICD-10-CM

## 2021-06-18 DIAGNOSIS — G4709 Other insomnia: Secondary | ICD-10-CM

## 2021-06-19 ENCOUNTER — Telehealth: Payer: Self-pay | Admitting: Rheumatology

## 2021-06-19 LAB — IRON,TIBC AND FERRITIN PANEL
%SAT: 32 % (calc) (ref 16–45)
Ferritin: 62 ng/mL (ref 16–288)
Iron: 105 ug/dL (ref 45–160)
TIBC: 324 mcg/dL (calc) (ref 250–450)

## 2021-06-19 LAB — COMPLETE METABOLIC PANEL WITH GFR
AG Ratio: 1.5 (calc) (ref 1.0–2.5)
ALT: 9 U/L (ref 6–29)
AST: 20 U/L (ref 10–35)
Albumin: 4.3 g/dL (ref 3.6–5.1)
Alkaline phosphatase (APISO): 86 U/L (ref 37–153)
BUN: 9 mg/dL (ref 7–25)
CO2: 29 mmol/L (ref 20–32)
Calcium: 9.9 mg/dL (ref 8.6–10.4)
Chloride: 106 mmol/L (ref 98–110)
Creat: 0.95 mg/dL (ref 0.60–1.00)
Globulin: 2.8 g/dL (calc) (ref 1.9–3.7)
Glucose, Bld: 85 mg/dL (ref 65–99)
Potassium: 4.8 mmol/L (ref 3.5–5.3)
Sodium: 143 mmol/L (ref 135–146)
Total Bilirubin: 0.5 mg/dL (ref 0.2–1.2)
Total Protein: 7.1 g/dL (ref 6.1–8.1)
eGFR: 63 mL/min/{1.73_m2} (ref >=60)

## 2021-06-19 LAB — CBC WITH DIFFERENTIAL/PLATELET
Absolute Monocytes: 390 cells/uL (ref 200–950)
Basophils Absolute: 31 cells/uL (ref 0–200)
Basophils Relative: 0.6 %
Eosinophils Absolute: 68 cells/uL (ref 15–500)
Eosinophils Relative: 1.3 %
HCT: 39.2 % (ref 35.0–45.0)
Hemoglobin: 12.6 g/dL (ref 11.7–15.5)
Lymphs Abs: 2626 cells/uL (ref 850–3900)
MCH: 24 pg — ABNORMAL LOW (ref 27.0–33.0)
MCHC: 32.1 g/dL (ref 32.0–36.0)
MCV: 74.8 fL — ABNORMAL LOW (ref 80.0–100.0)
MPV: 12.2 fL (ref 7.5–12.5)
Monocytes Relative: 7.5 %
Neutro Abs: 2085 cells/uL (ref 1500–7800)
Neutrophils Relative %: 40.1 %
Platelets: 202 10*3/uL (ref 140–400)
RBC: 5.24 10*6/uL — ABNORMAL HIGH (ref 3.80–5.10)
RDW: 15.7 % — ABNORMAL HIGH (ref 11.0–15.0)
Total Lymphocyte: 50.5 %
WBC: 5.2 10*3/uL (ref 3.8–10.8)

## 2021-06-19 LAB — SEDIMENTATION RATE: Sed Rate: 9 mm/h (ref 0–30)

## 2021-06-19 MED ORDER — OTEZLA 30 MG PO TABS: 1.0000 | ORAL_TABLET | Freq: Two times a day (BID) | ORAL | 0 refills | Status: DC

## 2021-06-19 NOTE — Progress Notes (Signed)
CBC stable. CMP WNL.  Iron panel WNL.  ESR WNL. Please forward lab work to PCP as requested by the patient.

## 2021-06-19 NOTE — Telephone Encounter (Signed)
Patient calling In reference to [facility] rx. Patient called [facility], and they did not receive rx. Please call # [phone] to verbally give rx. Please call patient once you contact [employer].

## 2021-06-19 NOTE — Telephone Encounter (Signed)
Patient advised we spoke with [employer] and the prescription was sent to the other [facility] pharmacy- [facility] when it needed to go to [facility]. Prescription resent and patient advised it would take about 24 hours to be processed and then they should be able to set up shipment. Patient expressed understanding.

## 2021-06-19 NOTE — Addendum Note (Signed)
Addended by: Henriette Combs on: 06/19/2021 04:15 PM   Modules accepted: Orders

## 2021-06-23 ENCOUNTER — Other Ambulatory Visit: Payer: Self-pay | Admitting: Family Medicine

## 2021-06-23 DIAGNOSIS — Z1231 Encounter for screening mammogram for malignant neoplasm of breast: Secondary | ICD-10-CM

## 2021-06-24 ENCOUNTER — Telehealth: Payer: Self-pay | Admitting: Rheumatology

## 2021-06-24 NOTE — Progress Notes (Signed)
Office Visit Note  Patient: [name]             Date of Birth: 01/28/1946           MRN: [id number]             PCP: Babs Sciara, MD Referring: Babs Sciara, MD Visit Date: 06/25/2021 Occupation: @GUAROCC @  Subjective:  Follow-up (Patient complains of left knee pain. )   History of Present Illness: [name] is a 75 y.o. female with a history of psoriatic arthritis and osteoarthritis.  She states she was doing some walking in place exercises about a month ago after that she started having pain and discomfort in her left knee joint.  She states the left knee joint pain has eased off to some extent but she still have ongoing discomfort.  She denies any history of joint swelling.  She had been out of [place] for about 2 weeks.  She states she has had some new psoriasis patches on her elbows.  Activities of Daily Living:  Patient reports morning stiffness for 0 minutes.   Patient Denies nocturnal pain.  Difficulty dressing/grooming: Denies Difficulty climbing stairs: Denies Difficulty getting out of chair: Denies Difficulty using hands for taps, buttons, cutlery, and/or writing: Denies  Review of Systems  Constitutional:  Negative for fatigue.  HENT:  Negative for mouth sores, mouth dryness and nose dryness.   Eyes:  Negative for pain, itching, visual disturbance and dryness.  Respiratory:  Negative for cough, hemoptysis, shortness of breath and difficulty breathing.   Cardiovascular:  Negative for chest pain, palpitations and swelling in legs/feet.  Gastrointestinal:  Negative for abdominal pain, blood in stool, constipation and diarrhea.  Endocrine: Negative for increased urination.  Genitourinary:  Negative for painful urination.  Musculoskeletal:  Positive for joint pain, joint pain and joint swelling. Negative for myalgias, muscle weakness, morning stiffness, muscle tenderness and myalgias.  Skin:  Negative for color change, rash and redness.   Allergic/Immunologic: Negative for susceptible to infections.  Neurological:  Negative for dizziness, numbness, headaches, memory loss and weakness.  Hematological:  Negative for swollen glands.  Psychiatric/Behavioral:  Positive for sleep disturbance. Negative for confusion.    PMFS History:  Patient Active Problem List   Diagnosis Date Noted   History of colonic polyps 07/04/2018   History of hyperlipidemia 02/09/2017   Other psoriasis 12/30/2016   High risk medications (not anticoagulants) long-term use 12/30/2016   Psoriatic arthritis (HCC) 12/27/2014   Vitamin D deficiency 08/06/2013   Osteopenia 08/06/2013   Insomnia 02/22/2013   Hyperlipidemia 01/09/2013   Anemia 09/12/2012   Toe cyanosis 08/08/2012   Osteoarthritis of shoulder 09/15/2010    Past Medical History:  Diagnosis Date   Hx of colonic polyps    Hypercholesterolemia    Psoriatic arthritis (HCC)     Family History  Problem Relation Age of Onset   Anesthesia problems Mother    Cancer Mother    Hypertension Mother    Breast cancer Mother    Cancer Father    Diabetes Father    Deep vein thrombosis Father    Hypertension Father    Arthritis Other        FH   Diabetes Other        FH   Cancer Other        FH   Hypertension Sister    Hypertension Brother    Heart disease Brother    Migraines Sister    Migraines Sister  Hypotension Neg Hx    Malignant hyperthermia Neg Hx    Pseudochol deficiency Neg Hx    Past Surgical History:  Procedure Laterality Date   ABDOMINAL HYSTERECTOMY     COLONOSCOPY  08/25/2011   Procedure: COLONOSCOPY;  Surgeon: Malissa Hippo, MD;  [facility];  Service: Endoscopy;  Laterality: N/A;  12:00   COLONOSCOPY N/A 09/21/2018   Procedure: COLONOSCOPY;  Surgeon: Malissa Hippo, MD;  [facility];  Service: Endoscopy;  Laterality: N/A;  830   left knee stable out  1966   left knee staple in  1962   VESICOVAGINAL FISTULA CLOSURE W/ TAH  1986    Social History   Social History Narrative   Not on file   Immunization History  Administered Date(s) Administered   Fluad Quad(high Dose 65+) 10/02/2020   Influenza,inj,Quad PF,6+ Mos 06/19/2019   Influenza-Unspecified 07/13/2016, 08/02/2018   Moderna Sars-Covid-2 Vaccination 11/28/2019, 12/26/2019, 08/26/2020, 05/15/2021   PPD Test 11/06/2019   Pneumococcal Conjugate-13 12/27/2014   Pneumococcal Polysaccharide-23 03/30/2016   Pneumococcal-Unspecified 11/06/2009     Objective: Vital Signs: BP 122/77 (BP Location: Left Arm, Patient Position: Sitting, Cuff Size: Normal)   Pulse 61   Ht 5\' 2"  (1.575 m)   Wt 147 lb 3.2 oz (66.8 kg)   BMI 26.92 kg/m    Physical Exam Vitals and nursing note reviewed.  Constitutional:      Appearance: She is well-developed.  HENT:     Head: Normocephalic and atraumatic.  Eyes:     Conjunctiva/sclera: Conjunctivae normal.  Cardiovascular:     Rate and Rhythm: Normal rate and regular rhythm.     Heart sounds: Normal heart sounds.  Pulmonary:     Effort: Pulmonary effort is normal.     Breath sounds: Normal breath sounds.  Abdominal:     General: Bowel sounds are normal.     Palpations: Abdomen is soft.  Musculoskeletal:     Cervical back: Normal range of motion.  Lymphadenopathy:     Cervical: No cervical adenopathy.  Skin:    General: Skin is warm and dry.     Capillary Refill: Capillary refill takes less than 2 seconds.     Comments: Psoriasis patches on elbows  Neurological:     Mental Status: She is alert and oriented to person, place, and time.  Psychiatric:        Behavior: Behavior normal.     Musculoskeletal Exam: C-spine was in good range of motion.  Shoulder joints, elbow joints, wrist joints, MCPs PIPs and DIPs with good range of motion with no synovitis.  PIP and DIP thickening was noted.  Hip joints and knee joints with good range of motion without any warmth swelling or effusion.  There was no tenderness over ankles or  MTPs.  CDAI Exam: CDAI Score: -- Patient Global: --; Provider Global: -- Swollen: --; Tender: -- Joint Exam 06/25/2021   No joint exam has been documented for this visit   There is currently no information documented on the homunculus. Go to the Rheumatology activity and complete the homunculus joint exam.  Investigation: No additional findings.  Imaging: XR KNEE 3 VIEW LEFT  Result Date: 06/25/2021 Moderate medial compartment narrowing and moderate patellofemoral narrowing was noted.  No chondrocalcinosis was noted. Impression: These findings are consistent with moderate osteoarthritis and moderate chondromalacia patella.   Recent Labs: Lab Results  Component Value Date   WBC 5.2 06/18/2021   HGB 12.6 06/18/2021   PLT 202 06/18/2021  NA 143 06/18/2021   K 4.8 06/18/2021   CL 106 06/18/2021   CO2 29 06/18/2021   GLUCOSE 85 06/18/2021   BUN 9 06/18/2021   CREATININE 0.95 06/18/2021   BILITOT 0.5 06/18/2021   ALKPHOS 105 01/08/2021   AST 20 06/18/2021   ALT 9 06/18/2021   PROT 7.1 06/18/2021   ALBUMIN 4.6 01/08/2021   CALCIUM 9.9 06/18/2021   GFRAA 73 08/04/2020    Speciality Comments: No specialty comments available.  Procedures:  No procedures performed Allergies: Etodolac   Assessment / Plan:     Visit Diagnoses: Psoriatic arthritis (HCC)-patient had no synovitis on examination.  Other psoriasis-she has some psoriasis patches on her bilateral elbows.  She states her psoriasis flared after being off Otezla for 2 weeks.  High risk medications (not anticoagulants) long-term use - Otezla 30 mg 1 tablet by mouth twice daily.  Primary osteoarthritis of left knee - X-rays of the left knee were obtained on 04/11/2019 which were consistent with severe chondromalacia patella and moderate osteoarthritis.  Patient states she started having increased left knee joint pain a month ago after doing some exercises in 1 place.  The knee joint pain has persisted.  She would like  to have x-ray of her left knee joint.- Plan: XR KNEE 3 VIEW LEFT.  X-ray showed moderate osteoarthritis and moderate chondromalacia patella x-ray findings were discussed with the patient.  Handout on lower extremity muscle strengthening exercises were given.  Osteopenia of multiple sites - DEXA 07/18/2020 The BMD measured at Forearm Radius 33% is 0.570 g/cm2 with a T-score of -2.0.  Use of calcium rich diet with vitamin D was discussed.  Resistive exercises was emphasized.  Vitamin D deficiency-she is on vitamin D supplement.  History of anemia  Other insomnia  History of hyperlipidemia  Orders: Orders Placed This Encounter  Procedures   XR KNEE 3 VIEW LEFT   No orders of the defined types were placed in this encounter.   Follow-Up Instructions: Return in about 5 months (around 11/25/2021) for Psoriatic arthritis.   Pollyann Savoy, MD  Note - This record has been created using Animal nutritionist.  Chart creation errors have been sought, but may not always  have been located. Such creation errors do not reflect on  the standard of medical care.

## 2021-06-24 NOTE — Telephone Encounter (Signed)
Contacted [employer] and spoke with [place]. He states they have received the prescription and they are unable to process the prescription as it does not have an original signature on it. Advised him we sent the prescription electronically as we have in the past. Advised we have never had an issues before using e-scribe to send a prescription for [facility]. Riki Rusk refused to use the prescription and advised that we would have to send a paper prescription with the doctor signature. Prescription faxed. Contacted patient and advised. Patient is out of medication and has an appointment in office tomorrow. Patient will pick up sample at that time.

## 2021-06-24 NOTE — Telephone Encounter (Signed)
Per patient, [employer] still needs more for the rx. Per [employer], the faxed rx did not have a manual signature on it. Also, when the person called in with the info, they did not reference to the prescription. Please, call to to verify information with [employer]. Also call patient once this has been done.

## 2021-06-25 ENCOUNTER — Ambulatory Visit: Payer: Self-pay

## 2021-06-25 ENCOUNTER — Other Ambulatory Visit: Payer: Self-pay

## 2021-06-25 ENCOUNTER — Encounter: Payer: Self-pay | Admitting: Rheumatology

## 2021-06-25 ENCOUNTER — Ambulatory Visit: Payer: Medicare Other | Admitting: Rheumatology

## 2021-06-25 VITALS — BP 122/77 | HR 61 | Ht 62.0 in | Wt 147.2 lb

## 2021-06-25 DIAGNOSIS — M1712 Unilateral primary osteoarthritis, left knee: Secondary | ICD-10-CM

## 2021-06-25 DIAGNOSIS — G4709 Other insomnia: Secondary | ICD-10-CM

## 2021-06-25 DIAGNOSIS — E559 Vitamin D deficiency, unspecified: Secondary | ICD-10-CM

## 2021-06-25 DIAGNOSIS — Z79899 Other long term (current) drug therapy: Secondary | ICD-10-CM

## 2021-06-25 DIAGNOSIS — L408 Other psoriasis: Secondary | ICD-10-CM

## 2021-06-25 DIAGNOSIS — Z862 Personal history of diseases of the blood and blood-forming organs and certain disorders involving the immune mechanism: Secondary | ICD-10-CM

## 2021-06-25 DIAGNOSIS — L4050 Arthropathic psoriasis, unspecified: Secondary | ICD-10-CM | POA: Diagnosis not present

## 2021-06-25 DIAGNOSIS — Z8639 Personal history of other endocrine, nutritional and metabolic disease: Secondary | ICD-10-CM

## 2021-06-25 DIAGNOSIS — M8589 Other specified disorders of bone density and structure, multiple sites: Secondary | ICD-10-CM

## 2021-06-25 NOTE — Telephone Encounter (Signed)
Medication Samples have been provided to the patient. ° °Drug name: Otezla       Strength: 30 mg        Qty: 1  LOT: [id number]  Exp.Date: 03/03/2023 ° °Dosing instructions: Take one tablet by mouth twice daily.   °

## 2021-06-25 NOTE — Patient Instructions (Signed)
Knee Exercises Ask your health care provider which exercises are safe for you. Do exercises exactly as told by your health care provider and adjust them as directed. It is normal to feel mild stretching, pulling, tightness, or discomfort as you do these exercises. Stop right away if you feel sudden pain or your pain gets worse. Do not begin these exercises until told by your health care provider. Stretching and range-of-motion exercises These exercises warm up your muscles and joints and improve the movement and flexibility of your knee. These exercises also help to relieve pain and swelling. Knee extension, prone Lie on your abdomen (prone position) on a bed. Place your left / right knee just beyond the edge of the surface so your knee is not on the bed. You can put a towel under your left / right thigh just above your kneecap for comfort. Relax your leg muscles and allow gravity to straighten your knee (extension). You should feel a stretch behind your left / right knee. Hold this position for __________ seconds. Scoot up so your knee is supported between repetitions. Repeat __________ times. Complete this exercise __________ times a day. Knee flexion, active  Lie on your back with both legs straight. If this causes back discomfort, bend your left / right knee so your foot is flat on the floor. Slowly slide your left / right heel back toward your buttocks. Stop when you feel a gentle stretch in the front of your knee or thigh (flexion). Hold this position for __________ seconds. Slowly slide your left / right heel back to the starting position. Repeat __________ times. Complete this exercise __________ times a day. Quadriceps stretch, prone  Lie on your abdomen on a firm surface, such as a bed or padded floor. Bend your left / right knee and hold your ankle. If you cannot reach your ankle or pant leg, loop a belt around your foot and grab the belt instead. Gently pull your heel toward your  buttocks. Your knee should not slide out to the side. You should feel a stretch in the front of your thigh and knee (quadriceps). Hold this position for __________ seconds. Repeat __________ times. Complete this exercise __________ times a day. Hamstring, supine Lie on your back (supine position). Loop a belt or towel over the ball of your left / right foot. The ball of your foot is on the walking surface, right under your toes. Straighten your left / right knee and slowly pull on the belt to raise your leg until you feel a gentle stretch behind your knee (hamstring). Do not let your knee bend while you do this. Keep your other leg flat on the floor. Hold this position for __________ seconds. Repeat __________ times. Complete this exercise __________ times a day. Strengthening exercises These exercises build strength and endurance in your knee. Endurance is the ability to use your muscles for a long time, even after they get tired. Quadriceps, isometric This exercise stretches the muscles in front of your thigh (quadriceps) without moving your knee joint (isometric). Lie on your back with your left / right leg extended and your other knee bent. Put a rolled towel or small pillow under your knee if told by your health care provider. Slowly tense the muscles in the front of your left / right thigh. You should see your kneecap slide up toward your hip or see increased dimpling just above the knee. This motion will push the back of the knee toward the floor. For __________   seconds, hold the muscle as tight as you can without increasing your pain. Relax the muscles slowly and completely. Repeat __________ times. Complete this exercise __________ times a day. Straight leg raises This exercise stretches the muscles in front of your thigh (quadriceps) and the muscles that move your hips (hip flexors). Lie on your back with your left / right leg extended and your other knee bent. Tense the muscles in  the front of your left / right thigh. You should see your kneecap slide up or see increased dimpling just above the knee. Your thigh may even shake a bit. Keep these muscles tight as you raise your leg 4-6 inches (10-15 cm) off the floor. Do not let your knee bend. Hold this position for __________ seconds. Keep these muscles tense as you lower your leg. Relax your muscles slowly and completely after each repetition. Repeat __________ times. Complete this exercise __________ times a day. Hamstring, isometric Lie on your back on a firm surface. Bend your left / right knee about __________ degrees. Dig your left / right heel into the surface as if you are trying to pull it toward your buttocks. Tighten the muscles in the back of your thighs (hamstring) to "dig" as hard as you can without increasing any pain. Hold this position for __________ seconds. Release the tension gradually and allow your muscles to relax completely for __________ seconds after each repetition. Repeat __________ times. Complete this exercise __________ times a day. Hamstring curls If told by your health care provider, do this exercise while wearing ankle weights. Begin with __________ lb weights. Then increase the weight by 1 lb (0.5 kg) increments. Do not wear ankle weights that are more than __________ lb. Lie on your abdomen with your legs straight. Bend your left / right knee as far as you can without feeling pain. Keep your hips flat against the floor. Hold this position for __________ seconds. Slowly lower your leg to the starting position. Repeat __________ times. Complete this exercise __________ times a day. Squats This exercise strengthens the muscles in front of your thigh and knee (quadriceps). Stand in front of a table, with your feet and knees pointing straight ahead. You may rest your hands on the table for balance but not for support. Slowly bend your knees and lower your hips like you are going to sit in a  chair. Keep your weight over your heels, not over your toes. Keep your lower legs upright so they are parallel with the table legs. Do not let your hips go lower than your knees. Do not bend lower than told by your health care provider. If your knee pain increases, do not bend as low. Hold the squat position for __________ seconds. Slowly push with your legs to return to standing. Do not use your hands to pull yourself to standing. Repeat __________ times. Complete this exercise __________ times a day. Wall slides This exercise strengthens the muscles in front of your thigh and knee (quadriceps). Lean your back against a smooth wall or door, and walk your feet out 18-24 inches (46-61 cm) from it. Place your feet hip-width apart. Slowly slide down the wall or door until your knees bend __________ degrees. Keep your knees over your heels, not over your toes. Keep your knees in line with your hips. Hold this position for __________ seconds. Repeat __________ times. Complete this exercise __________ times a day. Straight leg raises This exercise strengthens the muscles that rotate the leg at the hip and   move it away from your body (hip abductors). Lie on your side with your left / right leg in the top position. Lie so your head, shoulder, knee, and hip line up. You may bend your bottom knee to help you keep your balance. Roll your hips slightly forward so your hips are stacked directly over each other and your left / right knee is facing forward. Leading with your heel, lift your top leg 4-6 inches (10-15 cm). You should feel the muscles in your outer hip lifting. Do not let your foot drift forward. Do not let your knee roll toward the ceiling. Hold this position for __________ seconds. Slowly return your leg to the starting position. Let your muscles relax completely after each repetition. Repeat __________ times. Complete this exercise __________ times a day. Straight leg raises This  exercise stretches the muscles that move your hips away from the front of the pelvis (hip extensors). Lie on your abdomen on a firm surface. You can put a pillow under your hips if that is more comfortable. Tense the muscles in your buttocks and lift your left / right leg about 4-6 inches (10-15 cm). Keep your knee straight as you lift your leg. Hold this position for __________ seconds. Slowly lower your leg to the starting position. Let your leg relax completely after each repetition. Repeat __________ times. Complete this exercise __________ times a day. This information is not intended to replace advice given to you by your health care provider. Make sure you discuss any questions you have with your health care provider. Document Revised: 07/11/2018 Document Reviewed: 07/11/2018 Elsevier Patient Education  2022 [employer]  

## 2021-08-11 ENCOUNTER — Ambulatory Visit: Payer: Medicare Other

## 2021-09-16 ENCOUNTER — Ambulatory Visit
Admission: RE | Admit: 2021-09-16 | Discharge: 2021-09-16 | Disposition: A | Discharge status: Home / Self Care | Payer: Medicare Other | Source: Ambulatory Visit | Attending: Family Medicine | Admitting: Family Medicine

## 2021-09-16 DIAGNOSIS — Z1231 Encounter for screening mammogram for malignant neoplasm of breast: Secondary | ICD-10-CM

## 2021-11-03 NOTE — Progress Notes (Unsigned)
Office Visit Note  Patient: [name]             Date of Birth: May 26, 1946           MRN: [id number]             PCP: Kathyrn Drown, MD Referring: Kathyrn Drown, MD Visit Date: 11/17/2021 Occupation: @GUAROCC @  Subjective:  No chief complaint on file.   History of Present Illness: [name] is a 76 y.o. female ***   Activities of Daily Living:  Patient reports morning stiffness for *** {minute/hour:19697}.   Patient {ACTIONS;DENIES/REPORTS:21021675::"Denies"} nocturnal pain.  Difficulty dressing/grooming: {ACTIONS;DENIES/REPORTS:21021675::"Denies"} Difficulty climbing stairs: {ACTIONS;DENIES/REPORTS:21021675::"Denies"} Difficulty getting out of chair: {ACTIONS;DENIES/REPORTS:21021675::"Denies"} Difficulty using hands for taps, buttons, cutlery, and/or writing: {ACTIONS;DENIES/REPORTS:21021675::"Denies"}  No Rheumatology ROS completed.   PMFS History:  Patient Active Problem List   Diagnosis Date Noted   History of colonic polyps 07/04/2018   History of hyperlipidemia 02/09/2017   Other psoriasis 12/30/2016   High risk medications (not anticoagulants) long-term use 12/30/2016   Psoriatic arthritis ([place]) 12/27/2014   Vitamin D deficiency 08/06/2013   Osteopenia 08/06/2013   Insomnia 02/22/2013   Hyperlipidemia 01/09/2013   Anemia 09/12/2012   Toe cyanosis 08/08/2012   Osteoarthritis of shoulder 09/15/2010    Past Medical History:  Diagnosis Date   Hx of colonic polyps    Hypercholesterolemia    Psoriatic arthritis ([place])     Family History  Problem Relation Age of Onset   Anesthesia problems Mother    Cancer Mother    Hypertension Mother    Breast cancer Mother    Cancer Father    Diabetes Father    Deep vein thrombosis Father    Hypertension Father    Arthritis Other        FH   Diabetes Other        FH   Cancer Other        FH   Hypertension Sister    Hypertension Brother    Heart disease Brother    Migraines Sister     Migraines Sister    Hypotension Neg Hx    Malignant hyperthermia Neg Hx    Pseudochol deficiency Neg Hx    Past Surgical History:  Procedure Laterality Date   ABDOMINAL HYSTERECTOMY     COLONOSCOPY  08/25/2011   Procedure: COLONOSCOPY;  Surgeon: Rogene Houston, MD;  Location: [facility];  Service: Endoscopy;  Laterality: N/A;  12:00   COLONOSCOPY N/A 09/21/2018   Procedure: COLONOSCOPY;  Surgeon: Rogene Houston, MD;  Location: [facility];  Service: Endoscopy;  Laterality: N/A;  830   left knee stable out  1966   left knee staple in  [place]   Social History   Social History Narrative   Not on file   Immunization History  Administered Date(s) Administered   Fluad Quad(high Dose 65+) 10/02/2020, 07/29/2021   Influenza,inj,Quad PF,6+ Mos 06/19/2019   Influenza-Unspecified 07/13/2016, 08/02/2018   Moderna Sars-Covid-2 Vaccination 11/28/2019, 12/26/2019, 08/26/2020, 05/15/2021   PPD Test 11/06/2019   Pneumococcal Conjugate-13 12/27/2014   Pneumococcal Polysaccharide-23 03/30/2016   Pneumococcal-Unspecified 11/06/2009   Zoster Recombinat (Shingrix) 07/07/2021     Objective: Vital Signs: There were no vitals taken for this visit.   Physical Exam   Musculoskeletal Exam: ***  CDAI Exam: CDAI Score: -- Patient Global: --; Provider Global: -- Swollen: --; Tender: -- Joint Exam 11/17/2021   No joint exam  has been documented for this visit   There is currently no information documented on the homunculus. Go to the Rheumatology activity and complete the homunculus joint exam.  Investigation: No additional findings.  Imaging: No results found.  Recent Labs: Lab Results  Component Value Date   WBC 5.2 06/18/2021   HGB 12.6 06/18/2021   PLT 202 06/18/2021   NA 143 06/18/2021   K 4.8 06/18/2021   CL 106 06/18/2021   CO2 29 06/18/2021   GLUCOSE 85 06/18/2021   BUN 9 06/18/2021   CREATININE 0.95 06/18/2021   BILITOT  0.5 06/18/2021   ALKPHOS 105 01/08/2021   AST 20 06/18/2021   ALT 9 06/18/2021   PROT 7.1 06/18/2021   ALBUMIN 4.6 01/08/2021   CALCIUM 9.9 06/18/2021   GFRAA 73 08/04/2020    Speciality Comments: No specialty comments available.  Procedures:  No procedures performed Allergies: Etodolac   Assessment / Plan:     Visit Diagnoses: No diagnosis found.  Orders: No orders of the defined types were placed in this encounter.  No orders of the defined types were placed in this encounter.   Face-to-face time spent with patient was *** minutes. Greater than 50% of time was spent in counseling and coordination of care.  Follow-Up Instructions: No follow-ups on file.   Earnestine Mealing, CMA  Note - This record has been created using Editor, commissioning.  Chart creation errors have been sought, but may not always  have been located. Such creation errors do not reflect on  the standard of medical care.

## 2021-11-13 ENCOUNTER — Telehealth: Payer: Self-pay | Admitting: Family Medicine

## 2021-11-13 DIAGNOSIS — E785 Hyperlipidemia, unspecified: Secondary | ICD-10-CM

## 2021-11-13 DIAGNOSIS — Z79899 Other long term (current) drug therapy: Secondary | ICD-10-CM

## 2021-11-13 NOTE — Telephone Encounter (Signed)
Patient called to request provider order labs prior to upcoming appointment on 2/15; wants results to be back in time to discuss with provider.   Please advise at [phone].

## 2021-11-13 NOTE — Telephone Encounter (Signed)
Lab orders placed and pt is aware 

## 2021-11-13 NOTE — Progress Notes (Signed)
Office Visit Note  Patient: [name]             Date of Birth: Apr 14, 1946           MRN: [id number]             PCP: Babs Sciara, MD Referring: Babs Sciara, MD Visit Date: 11/23/2021 Occupation: @GUAROCC @  Subjective:  Occasional right knee pain   History of Present Illness: LAWANNA Fields is a 76 y.o. female with history of psoriatic arthritis and osteoarthritis.  She is taking otezla 30 mg 1 tablet by mouth twice daily.  She is tolerating Otezla without any side effects and has not missed any doses recently.  She denies any signs or symptoms of a psoriatic arthritis flare.  She states about 2 weeks ago she was working out in [employer] and then went on a walk with a friend.  She states that she thinks she may have "overdid it" and had some increased pain and swelling in her right knee joint.  She states that she iced her right knee which resolved her symptoms.  She has not needed to take any over-the-counter products for pain relief.  She denies any Achilles size or planter fasciitis.  She has not had any SI joint discomfort.  She denies any recent falls or fractures.  She continues to try to take a vitamin D supplement on a daily basis. She denies any active psoriasis at this time. She has not had any recent infections.     Activities of Daily Living:  Patient reports morning stiffness for 0 minutes.   Patient Denies nocturnal pain.  Difficulty dressing/grooming: Denies Difficulty climbing stairs: Denies Difficulty getting out of chair: Denies Difficulty using hands for taps, buttons, cutlery, and/or writing: Denies  Review of Systems  Constitutional:  Negative for fatigue.  HENT:  Negative for mouth sores, mouth dryness and nose dryness.   Eyes:  Negative for pain, itching and dryness.  Respiratory:  Negative for shortness of breath and difficulty breathing.   Cardiovascular:  Negative for chest pain and palpitations.  Gastrointestinal:  Negative for  blood in stool, constipation and diarrhea.  Endocrine: Negative for increased urination.  Genitourinary:  Negative for difficulty urinating.  Musculoskeletal:  Negative for joint pain, joint pain, joint swelling, myalgias, morning stiffness, muscle tenderness and myalgias.  Skin:  Negative for color change, rash and redness.  Allergic/Immunologic: Negative for susceptible to infections.  Neurological:  Negative for dizziness, numbness, headaches, memory loss and weakness.  Hematological:  Negative for bruising/bleeding tendency.  Psychiatric/Behavioral:  Negative for confusion.    PMFS History:  Patient Active Problem List   Diagnosis Date Noted   History of colonic polyps 07/04/2018   History of hyperlipidemia 02/09/2017   Other psoriasis 12/30/2016   High risk medications (not anticoagulants) long-term use 12/30/2016   Psoriatic arthritis (HCC) 12/27/2014   Vitamin D deficiency 08/06/2013   Osteopenia 08/06/2013   Insomnia 02/22/2013   Hyperlipidemia 01/09/2013   Anemia 09/12/2012   Toe cyanosis 08/08/2012   Osteoarthritis of shoulder 09/15/2010    Past Medical History:  Diagnosis Date   Hx of colonic polyps    Hypercholesterolemia    Psoriatic arthritis (HCC)     Family History  Problem Relation Age of Onset   Anesthesia problems Mother    Cancer Mother    Hypertension Mother    Breast cancer Mother    Cancer Father    Diabetes Father    Deep vein  thrombosis Father    Hypertension Father    Arthritis Other        FH   Diabetes Other        FH   Cancer Other        FH   Hypertension Sister    Hypertension Brother    Heart disease Brother    Migraines Sister    Migraines Sister    Hypotension Neg Hx    Malignant hyperthermia Neg Hx    Pseudochol deficiency Neg Hx    Past Surgical History:  Procedure Laterality Date   ABDOMINAL HYSTERECTOMY     COLONOSCOPY  08/25/2011   Procedure: COLONOSCOPY;  Surgeon: Rogene Houston, MD;  Location: [facility];   Service: Endoscopy;  Laterality: N/A;  12:00   COLONOSCOPY N/A 09/21/2018   Procedure: COLONOSCOPY;  Surgeon: Rogene Houston, MD;  Location: [facility];  Service: Endoscopy;  Laterality: N/A;  830   left knee stable out  1966   left knee staple in  [place]   Social History   Social History Narrative   Not on file   Immunization History  Administered Date(s) Administered   Fluad Quad(high Dose 65+) 10/02/2020, 07/29/2021   Influenza,inj,Quad PF,6+ Mos 06/19/2019   Influenza-Unspecified 07/13/2016, 08/02/2018   Moderna Sars-Covid-2 Vaccination 11/28/2019, 12/26/2019, 08/26/2020, 05/15/2021   PPD Test 11/06/2019   Pneumococcal Conjugate-13 12/27/2014   Pneumococcal Polysaccharide-23 03/30/2016   Pneumococcal-Unspecified 11/06/2009   Zoster Recombinat (Shingrix) 07/07/2021     Objective: Vital Signs: BP 122/71 (BP Location: Left Arm, Patient Position: Sitting, Cuff Size: Large)    Pulse (!) 58    Ht 5\' 2"  (1.575 m)    Wt 150 lb 3.2 oz (68.1 kg)    BMI 27.47 kg/m    Physical Exam Vitals and nursing note reviewed.  Constitutional:      Appearance: She is well-developed.  HENT:     Head: Normocephalic and atraumatic.  Eyes:     Conjunctiva/sclera: Conjunctivae normal.  Cardiovascular:     Rate and Rhythm: Normal rate and regular rhythm.     Heart sounds: Normal heart sounds.  Pulmonary:     Effort: Pulmonary effort is normal.     Breath sounds: Normal breath sounds.  Abdominal:     General: Bowel sounds are normal.     Palpations: Abdomen is soft.  Musculoskeletal:     Cervical back: Normal range of motion.  Skin:    General: Skin is warm and dry.     Capillary Refill: Capillary refill takes less than 2 seconds.     Comments: Fingernail pitting noted  Neurological:     Mental Status: She is alert and oriented to person, place, and time.  Psychiatric:        Behavior: Behavior normal.     Musculoskeletal Exam: C-spine,  thoracic spine, and lumbar spine have good range of motion with no discomfort.  No midline spinal tenderness or SI joint tenderness.  Shoulder joints, elbow joints, wrist joints, MCPs, PIPs, DIPs have good range of motion with no synovitis.  Complete fist formation bilaterally.  Thickening of PIP and DIP joints consistent with osteoarthritic changes noted.  Right hip has limited range of motion with no groin pain.  Left hip has good range of motion with no discomfort.  Knee joints have good range of motion with no warmth or effusion.  Ankle joints have good range of motion with no tenderness or  joint swelling.  No evidence of Achilles tendonitis.   CDAI Exam: CDAI Score: -- Patient Global: --; Provider Global: -- Swollen: 0 ; Tender: 0  Joint Exam 11/23/2021   No joint exam has been documented for this visit   There is currently no information documented on the homunculus. Go to the Rheumatology activity and complete the homunculus joint exam.  Investigation: No additional findings.  Imaging: No results found.  Recent Labs: Lab Results  Component Value Date   WBC 5.2 06/18/2021   HGB 12.6 06/18/2021   PLT 202 06/18/2021   NA 143 06/18/2021   K 4.8 06/18/2021   CL 106 06/18/2021   CO2 29 06/18/2021   GLUCOSE 85 06/18/2021   BUN 9 06/18/2021   CREATININE 0.95 06/18/2021   BILITOT 0.5 06/18/2021   ALKPHOS 105 01/08/2021   AST 20 06/18/2021   ALT 9 06/18/2021   PROT 7.1 06/18/2021   ALBUMIN 4.6 01/08/2021   CALCIUM 9.9 06/18/2021   GFRAA 73 08/04/2020    Speciality Comments: No specialty comments available.  Procedures:  No procedures performed Allergies: Etodolac   Assessment / Plan:     Visit Diagnoses: Psoriatic arthritis ([place]): She has no synovitis or dactylitis on examination today.  She has not had any signs or symptoms of a psoriatic arthritis flare.  She is clinically doing well taking Otezla 30 mg 1 tablet by mouth twice daily.  She is tolerating Otezla without  any side effects.  She has occasional discomfort in her right knee joint but no warmth or effusion was noted on examination today.  She typically applies ice to the right knee as needed for symptomatic relief.  She has not needed to take any over-the-counter products.  She has no evidence of Achilles denies or planter fasciitis.  She has no SI joint tenderness upon palpation.  She has no active psoriasis at this time.  Fingernail pitting was noted.  She will remain on Otezla as prescribed.  She was advised to notify us if she develops increased joint pain or joint swelling.  She will follow-up in the office in 5 months.  Other psoriasis: She has no active psoriasis at this time. Fingernail pitting noted.  She will remain on Otezla as prescribed.  High risk medications (not anticoagulants) long-term use - Otezla 30 mg 1 tablet by mouth twice daily.  CBC and CMP drawn on 06/18/2021.  She will be having lab work next week ordered by her PCP.  She was given orders for CBC and CMP to be drawn with her upcoming lab results. She has not had any recent infections.  Primary osteoarthritis of left knee - X-rays of the left knee were obtained on 04/11/2019 which were consistent with severe chondromalacia patella and moderate osteoarthritis.  She has good range of motion of the left knee joint on examination today.  No warmth or effusion was noted.  Discussed the importance of lower extremity muscle strengthening.  Chronic pain of right knee: She experiences intermittent discomfort in the right knee joint typically after overuse or strenuous activities.  She has not had any recent injuries or falls.  No mechanical symptoms.  She has good range of motion of the right knee joint on examination today with no warmth or effusion.  When she experiences discomfort she typically uses ice as needed for symptomatic relief.  She has not needed to take any over-the-counter products for pain relief.  Discussed the importance of lower  extremity muscle strengthening.  She was  advised to notify us if her right knee joint pain persists or worsens.  Osteopenia of multiple sites - DEXA 07/18/2020 The BMD measured at Forearm Radius 33% is 0.570 g/cm2 with a T-score of -2.0.  She has not had any recent falls or fractures.  She will be due to update a bone density in October 2023.  She was encouraged to continue to take a calcium and vitamin D supplement daily.  Vitamin D deficiency: She is taking vitamin D 2000 units daily.  Other medical conditions are listed as follows:  Other insomnia  History of anemia  History of hyperlipidemia  Orders: No orders of the defined types were placed in this encounter.  No orders of the defined types were placed in this encounter.     Follow-Up Instructions: Return in about 5 months (around 04/22/2022) for Psoriatic arthritis, Osteoarthritis.   Ofilia Neas, PA-C  Note - This record has been created using Dragon software.  Chart creation errors have been sought, but may not always  have been located. Such creation errors do not reflect on  the standard of medical care.

## 2021-11-13 NOTE — Telephone Encounter (Signed)
Last labs completed 05/12/21 Lipid; 01/08/21 BMET, Ferritin, CBC, Hepatic. Please advise. Thank you

## 2021-11-13 NOTE — Telephone Encounter (Signed)
Lipid, liver 

## 2021-11-17 ENCOUNTER — Ambulatory Visit: Payer: Medicare Other | Admitting: Rheumatology

## 2021-11-17 DIAGNOSIS — G4709 Other insomnia: Secondary | ICD-10-CM

## 2021-11-17 DIAGNOSIS — M1712 Unilateral primary osteoarthritis, left knee: Secondary | ICD-10-CM

## 2021-11-17 DIAGNOSIS — Z862 Personal history of diseases of the blood and blood-forming organs and certain disorders involving the immune mechanism: Secondary | ICD-10-CM

## 2021-11-17 DIAGNOSIS — M8589 Other specified disorders of bone density and structure, multiple sites: Secondary | ICD-10-CM

## 2021-11-17 DIAGNOSIS — Z8639 Personal history of other endocrine, nutritional and metabolic disease: Secondary | ICD-10-CM

## 2021-11-17 DIAGNOSIS — L4050 Arthropathic psoriasis, unspecified: Secondary | ICD-10-CM

## 2021-11-17 DIAGNOSIS — L408 Other psoriasis: Secondary | ICD-10-CM

## 2021-11-17 DIAGNOSIS — E559 Vitamin D deficiency, unspecified: Secondary | ICD-10-CM

## 2021-11-17 DIAGNOSIS — Z79899 Other long term (current) drug therapy: Secondary | ICD-10-CM

## 2021-11-18 ENCOUNTER — Ambulatory Visit: Payer: Medicare Other | Admitting: Family Medicine

## 2021-11-19 ENCOUNTER — Ambulatory Visit: Payer: Medicare Other | Admitting: Rheumatology

## 2021-11-23 ENCOUNTER — Telehealth: Payer: Self-pay | Admitting: Pharmacist

## 2021-11-23 ENCOUNTER — Other Ambulatory Visit: Payer: Self-pay

## 2021-11-23 ENCOUNTER — Encounter: Payer: Self-pay | Admitting: Physician Assistant

## 2021-11-23 ENCOUNTER — Ambulatory Visit: Payer: Medicare Other | Admitting: Physician Assistant

## 2021-11-23 VITALS — BP 122/71 | HR 58 | Ht 62.0 in | Wt 150.2 lb

## 2021-11-23 DIAGNOSIS — Z862 Personal history of diseases of the blood and blood-forming organs and certain disorders involving the immune mechanism: Secondary | ICD-10-CM

## 2021-11-23 DIAGNOSIS — E559 Vitamin D deficiency, unspecified: Secondary | ICD-10-CM

## 2021-11-23 DIAGNOSIS — L408 Other psoriasis: Secondary | ICD-10-CM | POA: Diagnosis not present

## 2021-11-23 DIAGNOSIS — M25561 Pain in right knee: Secondary | ICD-10-CM

## 2021-11-23 DIAGNOSIS — L4050 Arthropathic psoriasis, unspecified: Secondary | ICD-10-CM | POA: Diagnosis not present

## 2021-11-23 DIAGNOSIS — M1712 Unilateral primary osteoarthritis, left knee: Secondary | ICD-10-CM

## 2021-11-23 DIAGNOSIS — M8589 Other specified disorders of bone density and structure, multiple sites: Secondary | ICD-10-CM

## 2021-11-23 DIAGNOSIS — G8929 Other chronic pain: Secondary | ICD-10-CM

## 2021-11-23 DIAGNOSIS — Z8639 Personal history of other endocrine, nutritional and metabolic disease: Secondary | ICD-10-CM

## 2021-11-23 DIAGNOSIS — G4709 Other insomnia: Secondary | ICD-10-CM

## 2021-11-23 DIAGNOSIS — Z79899 Other long term (current) drug therapy: Secondary | ICD-10-CM

## 2021-11-23 NOTE — Telephone Encounter (Signed)
Patient had OV today. She completed her portion of Otezla PAP renewal application through [employer]. Provider portion completed today and faxed to [employer] with insurance card copy and med list  Chesley Mires, PharmD, MPH, BCPS Clinical Pharmacist (Rheumatology and Pulmonology)

## 2021-11-27 ENCOUNTER — Telehealth: Payer: Self-pay

## 2021-11-27 NOTE — Telephone Encounter (Signed)
[name] from [employer] left a voicemail stating they are helping patient with her [name].  They need the prescription properly filled out.  The refill information for the whole year is not checked off on the prescription.  Page 4 is an actual prescription that [name] signed.  Patient is also going to fax her corrected portion to the office.  Our processing time is a few days.   Phone:  #[phone]

## 2021-11-27 NOTE — Telephone Encounter (Unsigned)
Patient called stating she received a call from [employer] to let her know that page 4 was not filled out correctly.  Patient states she also needed to correct something on the patient section too.  Patient states she will fax her section to the office so it can be included with page 4.  Patient requested a return call.

## 2021-11-30 NOTE — Telephone Encounter (Signed)
Reached out and spoke to rep who stated that pt had called earlier today and was able to provide all missing info, her application has been been forwarded to receive determination. Confirmed that there was nothing additional needed from providers office at this time. Will continue to await f/u.

## 2021-11-30 NOTE — Telephone Encounter (Signed)
Consolidating encounters. °

## 2021-12-01 LAB — HEPATIC FUNCTION PANEL
ALT: 11 IU/L (ref 0–32)
AST: 24 IU/L (ref 0–40)
Albumin: 4.5 g/dL (ref 3.7–4.7)
Alkaline Phosphatase: 107 IU/L (ref 44–121)
Bilirubin Total: 0.3 mg/dL (ref 0.0–1.2)
Bilirubin, Direct: 0.1 mg/dL (ref 0.00–0.40)
Total Protein: 6.8 g/dL (ref 6.0–8.5)

## 2021-12-01 LAB — LIPID PANEL
Chol/HDL Ratio: 2.3 ratio (ref 0.0–4.4)
Cholesterol, Total: 179 mg/dL (ref 100–199)
HDL: 77 mg/dL (ref >39)
LDL Chol Calc (NIH): 93 mg/dL (ref 0–99)
Triglycerides: 46 mg/dL (ref 0–149)
VLDL Cholesterol Cal: 9 mg/dL (ref 5–40)

## 2021-12-01 NOTE — Telephone Encounter (Signed)
Received a fax from  [employer] regarding an approval for OTEZLA patient assistance from 11/30/21 to 10/03/22.   Phone number: [phone]  Chesley Mires, PharmD, MPH, BCPS Clinical Pharmacist (Rheumatology and Pulmonology)

## 2021-12-04 ENCOUNTER — Other Ambulatory Visit: Payer: Self-pay | Admitting: *Deleted

## 2021-12-04 ENCOUNTER — Other Ambulatory Visit: Payer: Self-pay

## 2021-12-04 ENCOUNTER — Encounter: Payer: Self-pay | Admitting: Family Medicine

## 2021-12-04 ENCOUNTER — Ambulatory Visit (INDEPENDENT_AMBULATORY_CARE_PROVIDER_SITE_OTHER): Payer: Medicare Other | Admitting: Family Medicine

## 2021-12-04 VITALS — BP 118/68 | HR 44 | Temp 98.3°F | Ht 62.0 in | Wt 152.0 lb

## 2021-12-04 DIAGNOSIS — L4050 Arthropathic psoriasis, unspecified: Secondary | ICD-10-CM

## 2021-12-04 DIAGNOSIS — E785 Hyperlipidemia, unspecified: Secondary | ICD-10-CM | POA: Diagnosis not present

## 2021-12-04 NOTE — Progress Notes (Signed)
Labs ordered in Epic.

## 2021-12-04 NOTE — Patient Instructions (Signed)
Do labs and Ov-please do your labs before your follow-up visit in late August/early September ? ?If you need anything please let us know ? ?Also give thought regarding where you would like to live for 1 month if you had the magical powers to do so ? ?TakeCare-Dr. Lorin Picket ?

## 2021-12-04 NOTE — Progress Notes (Signed)
? ?  Subjective:  ? ? Patient ID: [name], female    DOB: 1946-02-28, 76 y.o.   MRN: [id number] ? ?HPI ?6 month follow up  ? ?Review recent labs  ?Results for orders placed or performed in visit on 11/13/21  ?Lipid Profile  ?Result Value Ref Range  ? Cholesterol, Total 179 100 - 199 mg/dL  ? Triglycerides 46 0 - 149 mg/dL  ? HDL 77 >39 mg/dL  ? VLDL Cholesterol Cal 9 5 - 40 mg/dL  ? LDL Chol Calc (NIH) 93 0 - 99 mg/dL  ? Chol/HDL Ratio 2.3 0.0 - 4.4 ratio  ?Hepatic function panel  ?Result Value Ref Range  ? Total Protein 6.8 6.0 - 8.5 g/dL  ? Albumin 4.5 3.7 - 4.7 g/dL  ? Bilirubin Total 0.3 0.0 - 1.2 mg/dL  ? Bilirubin, Direct 0.10 0.00 - 0.40 mg/dL  ? Alkaline Phosphatase 107 44 - 121 IU/L  ? AST 24 0 - 40 IU/L  ? ALT 11 0 - 32 IU/L  ? ? ?Has hyperlipidemia takes her medicine and tolerates them well denies any side effects ?Has psoriatic arthritis takes [name] currently states is doing well ? ? ?Review of Systems ? ?   ?Objective:  ? Physical Exam ? ?General-in no acute distress ?Eyes-no discharge ?Lungs-respiratory rate normal, CTA ?CV-no murmurs,RRR ?Extremities skin warm dry no edema ?Neuro grossly normal ?Behavior normal, alert ? ? ? ?   ?Assessment & Plan:  ?Blood pressure doing very well ?Psoriatic arthritis follows with specialist ?On Otezla tolerating well ?Tdap she will get through her pharmacy ?She will find out the date of her shingles vaccine and let us know ?Cholesterol profile looks very good ?Continue current medications ? ? ?Patient will also think about where she would like to live for 1 month if she had the opportunity (she stated during her visit when we were talking about [place] how she thought it would be me to live somewhere completely different) ? ?

## 2021-12-17 ENCOUNTER — Emergency Department (HOSPITAL_COMMUNITY)
Admission: EM | Admit: 2021-12-17 | Discharge: 2021-12-17 | Disposition: A | Discharge status: Home / Self Care | Payer: Medicare Other | Attending: Emergency Medicine | Admitting: Emergency Medicine

## 2021-12-17 ENCOUNTER — Emergency Department (HOSPITAL_COMMUNITY): Payer: Medicare Other

## 2021-12-17 ENCOUNTER — Encounter (HOSPITAL_COMMUNITY): Payer: Self-pay | Admitting: *Deleted

## 2021-12-17 DIAGNOSIS — R0981 Nasal congestion: Secondary | ICD-10-CM | POA: Insufficient documentation

## 2021-12-17 DIAGNOSIS — R059 Cough, unspecified: Secondary | ICD-10-CM | POA: Diagnosis present

## 2021-12-17 DIAGNOSIS — Z20822 Contact with and (suspected) exposure to covid-19: Secondary | ICD-10-CM | POA: Insufficient documentation

## 2021-12-17 DIAGNOSIS — R051 Acute cough: Secondary | ICD-10-CM | POA: Diagnosis not present

## 2021-12-17 DIAGNOSIS — R062 Wheezing: Secondary | ICD-10-CM | POA: Insufficient documentation

## 2021-12-17 DIAGNOSIS — J3489 Other specified disorders of nose and nasal sinuses: Secondary | ICD-10-CM | POA: Insufficient documentation

## 2021-12-17 LAB — BASIC METABOLIC PANEL
Anion gap: 9 (ref 5–15)
BUN: 12 mg/dL (ref 8–23)
CO2: 23 mmol/L (ref 22–32)
Calcium: 8.6 mg/dL — ABNORMAL LOW (ref 8.9–10.3)
Chloride: 105 mmol/L (ref 98–111)
Creatinine, Ser: 0.87 mg/dL (ref 0.44–1.00)
GFR, Estimated: >60 mL/min (ref >60)
Glucose, Bld: 90 mg/dL (ref 70–99)
Potassium: 3.8 mmol/L (ref 3.5–5.1)
Sodium: 137 mmol/L (ref 135–145)

## 2021-12-17 LAB — CBC WITH DIFFERENTIAL/PLATELET
Abs Immature Granulocytes: 0.01 10*3/uL (ref 0.00–0.07)
Basophils Absolute: 0 10*3/uL (ref 0.0–0.1)
Basophils Relative: 0 %
Eosinophils Absolute: 0 10*3/uL (ref 0.0–0.5)
Eosinophils Relative: 1 %
HCT: 37.8 % (ref 36.0–46.0)
Hemoglobin: 12.2 g/dL (ref 12.0–15.0)
Immature Granulocytes: 0 %
Lymphocytes Relative: 43 %
Lymphs Abs: 2.2 10*3/uL (ref 0.7–4.0)
MCH: 24.4 pg — ABNORMAL LOW (ref 26.0–34.0)
MCHC: 32.3 g/dL (ref 30.0–36.0)
MCV: 75.4 fL — ABNORMAL LOW (ref 80.0–100.0)
Monocytes Absolute: 0.6 10*3/uL (ref 0.1–1.0)
Monocytes Relative: 11 %
Neutro Abs: 2.2 10*3/uL (ref 1.7–7.7)
Neutrophils Relative %: 45 %
Platelets: 178 10*3/uL (ref 150–400)
RBC: 5.01 MIL/uL (ref 3.87–5.11)
RDW: 15 % (ref 11.5–15.5)
WBC: 5 10*3/uL (ref 4.0–10.5)
nRBC: 0 % (ref 0.0–0.2)

## 2021-12-17 LAB — RESP PANEL BY RT-PCR (FLU A&B, COVID) ARPGX2
Influenza A by PCR: NEGATIVE
Influenza B by PCR: NEGATIVE
SARS Coronavirus 2 by RT PCR: NEGATIVE

## 2021-12-17 MED ORDER — PREDNISONE 20 MG PO TABS: 40.0000 mg | ORAL_TABLET | Freq: Every day | ORAL | 0 refills | Status: DC

## 2021-12-17 MED ORDER — ALBUTEROL SULFATE HFA 108 (90 BASE) MCG/ACT IN AERS
2.0000 | INHALATION_SPRAY | Freq: Once | RESPIRATORY_TRACT | Status: AC
  Administered 2021-12-17: 2 via RESPIRATORY_TRACT
  Filled 2021-12-17: qty 6.7

## 2021-12-17 MED ORDER — BENZONATATE 200 MG PO CAPS: 200.0000 mg | ORAL_CAPSULE | Freq: Three times a day (TID) | ORAL | 0 refills | Status: DC | PRN

## 2021-12-17 MED ORDER — PREDNISONE 20 MG PO TABS
40.0000 mg | ORAL_TABLET | Freq: Once | ORAL | Status: AC
  Administered 2021-12-17: 40 mg via ORAL
  Filled 2021-12-17: qty 2

## 2021-12-17 MED ORDER — BENZONATATE 100 MG PO CAPS
200.0000 mg | ORAL_CAPSULE | Freq: Once | ORAL | Status: AC
  Administered 2021-12-17: 200 mg via ORAL
  Filled 2021-12-17: qty 2

## 2021-12-17 NOTE — Discharge Instructions (Signed)
Drink plenty of fluids.  You may take Tylenol every 4 hours if needed for fever and/or body aches.  Use the albuterol inhaler, 1 to 2 puffs every 4-6 hours as needed.  You may start the prednisone prescription tomorrow.  Please follow-up with your primary care provider for recheck or return to the emergency department for any new or worsening symptoms. ?

## 2021-12-17 NOTE — ED Triage Notes (Signed)
Cough with congestion for several days ?

## 2021-12-17 NOTE — ED Provider Notes (Signed)
?[facility] EMERGENCY DEPARTMENT ?Provider Note ? ? ?CSN: [id number] ?Arrival date & time: 12/17/21  1517 ? ?  ? ?History ? ?Chief Complaint  ?Patient presents with  ? Cough  ? ? ?[name] is a 76 y.o. female. ? ? ?Cough ?Associated symptoms: rhinorrhea and wheezing   ?Associated symptoms: no chest pain, no chills, no fever, no headaches, no myalgias, no rash, no shortness of breath and no sore throat   ? ?  ? ? ?[name] is a 76 y.o. female who presents to the Emergency Department complaining of cough and nasal congestion.  Symptoms have been present since Monday.  She reports a mostly non productive cough.  She denies any chest pain or shortness of breath.  She has had some nasal congestion with clear, thick rhinorrhea.  Symptoms began with sore throat but that has since resolved.  States that she had a foster child staying with her last week that had some type of "cold."  Her cough is mostly nonproductive but states that she feels something "in my chest that I need to cough up."  She has used over-the-counter antitussive medications without relief.  No known COVID exposures, she is fully vaccinated against COVID. ? ? ? ?Home Medications ?Prior to Admission medications   ?Medication Sig Start Date End Date Taking? Authorizing Provider  ?Apremilast (OTEZLA) 30 MG TABS Take 1 tablet (30 mg total) by mouth 2 (two) times daily. 06/19/21   Pollyann Savoy, MD  ?Cholecalciferol (VITAMIN D3) 50 MCG (2000 UT) TABS Take 2,000 Units by mouth every evening.    [provider]  ?rosuvastatin (CRESTOR) 5 MG tablet Take 1 tablet (5 mg total) by mouth daily. 05/18/21   Babs Sciara, MD  ?   ? ?Allergies    ?Etodolac   ? ?Review of Systems   ?Review of Systems  ?Constitutional:  Negative for appetite change, chills and fever.  ?HENT:  Positive for congestion and rhinorrhea. Negative for sore throat and trouble swallowing.   ?Respiratory:  Positive for cough and wheezing. Negative for shortness of  breath.   ?Cardiovascular:  Negative for chest pain and leg swelling.  ?Gastrointestinal:  Negative for abdominal pain, diarrhea, nausea and vomiting.  ?Genitourinary:  Negative for difficulty urinating and dysuria.  ?Musculoskeletal:  Negative for arthralgias and myalgias.  ?Skin:  Negative for rash.  ?Neurological:  Negative for dizziness and headaches.  ?Psychiatric/Behavioral:  Negative for confusion.   ?All other systems reviewed and are negative. ? ?Physical Exam ?Updated Vital Signs ?BP 131/70   Pulse 68   Temp 98.2 ?F (36.8 ?C) (Oral)   Resp 19   Ht 5\' 2"  (1.575 m)   Wt 64.9 kg   SpO2 97%   BMI 26.16 kg/m?  ?Physical Exam ?Vitals and nursing note reviewed.  ?Constitutional:   ?   General: She is not in acute distress. ?   Appearance: Normal appearance.  ?Cardiovascular:  ?   Rate and Rhythm: Normal rate and regular rhythm.  ?   Pulses: Normal pulses.  ?Pulmonary:  ?   Effort: Pulmonary effort is normal. No respiratory distress.  ?   Breath sounds: Wheezing and rhonchi present.  ?   Comments: Scattered expiratory wheezes and rhonchi throughout.  Increased work of breathing. ?Abdominal:  ?   Palpations: Abdomen is soft.  ?   Tenderness: There is no abdominal tenderness.  ?Musculoskeletal:     ?   General: Normal range of motion.  ?   Cervical back:  Normal range of motion.  ?   Right lower leg: No edema.  ?   Left lower leg: No edema.  ?Skin: ?   General: Skin is warm.  ?   Capillary Refill: Capillary refill takes less than 2 seconds.  ?   Findings: No erythema or rash.  ?Neurological:  ?   General: No focal deficit present.  ?   Mental Status: She is alert.  ?   Sensory: No sensory deficit.  ?   Motor: No weakness.  ? ? ?ED Results / Procedures / Treatments   ?Labs ?(all labs ordered are listed, but only abnormal results are displayed) ?Labs Reviewed  ?CBC WITH DIFFERENTIAL/PLATELET - Abnormal; Notable for the following components:  ?    Result Value  ? MCV 75.4 (*)   ? MCH 24.4 (*)   ? All other  components within normal limits  ?BASIC METABOLIC PANEL - Abnormal; Notable for the following components:  ? Calcium 8.6 (*)   ? All other components within normal limits  ?RESP PANEL BY RT-PCR (FLU A&B, COVID) ARPGX2  ? ? ?EKG ?None ? ?Radiology ?DG Chest Portable 1 View ? ?Result Date: 12/17/2021 ?CLINICAL DATA:  cough EXAM: PORTABLE CHEST 1 VIEW COMPARISON:  Chest radiograph 10/30/2009. FINDINGS: No consolidation. No visible pleural effusions or pneumothorax. Cardiac silhouette is within normal limits and similar to prior. No evidence of acute osseous abnormality. IMPRESSION: No evidence of acute cardiopulmonary disease. Electronically Signed   By: Feliberto Harts M.D.   On: 12/17/2021 16:26   ? ?Procedures ?Procedures  ? ? ?Medications Ordered in ED ?Medications  ?albuterol (VENTOLIN HFA) 108 (90 Base) MCG/ACT inhaler 2 puff (has no administration in time range)  ? ? ?ED Course/ Medical Decision Making/ A&P ?  ?                        ?Medical Decision Making ?Patient here for evaluation of cough and congestion.  Symptoms have been present for 4 days.  Initially began with sore throat that has since resolved.  She denies any chest pain or shortness of breath.  No peripheral edema.  No history of asthma or prior pneumonia. ? ?On exam, patient is well-appearing.  Vital signs are reassuring.  She is nontoxic-appearing.  She has coarse rhonchi and expiratory wheezes on lung exam.  No increased work of breathing.  Will obtain baseline labs, COVID test and chest x-ray.  I anticipate discharge home. ? ?Amount and/or Complexity of Data Reviewed ?Labs: ordered. ?   Details: Labs reviewed by me, no leukocytosis, chemistries unremarkable.  COVID and flu test are negative. ?Radiology: ordered. ?   Details: Chest x-ray without acute cardiopulmonary process. ? ?Risk ?Prescription drug management. ? ? ?Patient here with likely bronchitis, she is ambulatory in the department and maintained O2 sat above 95% ? ?I feel that she  is appropriate for discharge home, will treat with Tessalon, albuterol MDI and steroids.  She agrees to close outpatient follow-up and return precautions were discussed. ? ? ? ? ? ? ? ?Final Clinical Impression(s) / ED Diagnoses ?Final diagnoses:  ?Acute cough  ? ? ?Rx / DC Orders ?ED Discharge Orders   ? ? None  ? ?  ? ? ?  ?Pauline Aus, PA-C ?12/17/21 1931 ? ?  ?Eber Hong, MD ?12/18/21 1256 ? ?

## 2021-12-21 ENCOUNTER — Other Ambulatory Visit: Payer: Self-pay

## 2021-12-21 ENCOUNTER — Encounter: Payer: Self-pay | Admitting: Family Medicine

## 2021-12-21 ENCOUNTER — Ambulatory Visit (INDEPENDENT_AMBULATORY_CARE_PROVIDER_SITE_OTHER): Payer: Medicare Other | Admitting: Family Medicine

## 2021-12-21 VITALS — BP 140/70 | HR 62 | Temp 97.9°F | Ht 62.0 in | Wt 153.0 lb

## 2021-12-21 DIAGNOSIS — J189 Pneumonia, unspecified organism: Secondary | ICD-10-CM | POA: Diagnosis not present

## 2021-12-21 MED ORDER — AZITHROMYCIN 250 MG PO TABS: ORAL_TABLET | ORAL | 0 refills | Status: AC

## 2021-12-21 MED ORDER — AMOXICILLIN-POT CLAVULANATE 875-125 MG PO TABS: 1.0000 | ORAL_TABLET | Freq: Two times a day (BID) | ORAL | 0 refills | Status: DC

## 2021-12-21 NOTE — Progress Notes (Signed)
? ?  Subjective:  ? ? Patient ID: [name], female    DOB: [birth year], 76 y.o.   MRN: [id number] ? ?HPI ? ?Patient follow up ER visit 3/16 chest congestion and cough- currently taking prednisone and tessalon pearls ?Still experiencing some wheezing  ?ER notes reviewed ?X-ray reviewed lab work reviewed ? ?Last week with significant chest congestion coughing now having ongoing coughing ?Does have underlying rheumatoid arthritis ?Review of Systems ? ?   ?Objective:  ? Physical Exam ?Lungs no respiratory distress ?Increased congestion and rales rhonchi in the left base ?No wheezing or difficulty breathing currently ? ?Blood pressure slightly elevated more than likely due to prednisone ? ?   ?Assessment & Plan:  ?Community-acquired pneumonia ?Augmentin 7 days ?Zithromax 5 days ?Warning signs discussed ?If not dramatically better by next week recheck ? ?

## 2021-12-21 NOTE — Patient Instructions (Signed)
Hi [name] ?It was good to see you today. ?You will be on 2 antibiotics. ?Azithromycin-2 tablets today then 1 tablet daily for the next 4 days. ?Augmentin-which is amoxicillin with clavulanate-1 tablet twice daily for 7 days ?You may stop taking your prednisone ?Take your medications with a snack to lessen the chance of upset stomach ?If you feel things are getting worse or having problems please reach out to us-feel free to call if any issues ?You should be feeling much better by next Monday if not I would like to recheck you ?[facility]-Dr. Lorin Picket ? ?Community-Acquired Pneumonia, Adult ?Pneumonia is an infection of the lungs. It causes irritation and swelling in the airways of the lungs. Mucus and fluid may also build up inside the airways. This may cause coughing and trouble breathing. ?One type of pneumonia can happen while you are in a hospital. A different type can happen when you are not in a hospital (community-acquired pneumonia). ?What are the causes? ?This condition is caused by germs (viruses, bacteria, or fungi). Some types of germs can spread from person to person. Pneumonia is not thought to spread from person to person. ?What increases the risk? ?You are more likely to develop this condition if: ?You have a long-term (chronic) disease, such as: ?Disease of the lungs. This may be chronic obstructive pulmonary disease (COPD) or asthma. ?Heart failure. ?Cystic fibrosis. ?Diabetes. ?Kidney disease. ?Sickle cell disease. ?HIV. ?You have other health problems, such as: ?Your body's defense system (immune system) is weak. ?A condition that may cause you to breathe in fluids from your mouth and nose. ?You had your spleen taken out. ?You do not take good care of your teeth and mouth (poor dental hygiene). ?You use or have used tobacco products. ?You travel where the germs that cause this illness are common. ?You are near certain animals or the places they live. ?You are older than 76 years of age. ?What are  the signs or symptoms? ?Symptoms of this condition include: ?A cough. ?A fever. ?Sweating or chills. ?Chest pain, often when you breathe deeply or cough. ?Breathing problems, such as: ?Fast breathing. ?Trouble breathing. ?Shortness of breath. ?Feeling tired (fatigued). ?Muscle aches. ?How is this treated? ?Treatment for this condition depends on many things, such as: ?The cause of your illness. ?Your medicines. ?Your other health problems. ?Most adults can be treated at home. Sometimes, treatment must happen in a hospital. ?Treatment may include medicines to kill germs. ?Medicines may depend on which germ caused your illness. ?Very bad pneumonia is rare. If you get it, you may: ?Have a machine to help you breathe. ?Have fluid taken away from around your lungs. ?Follow these instructions at home: ?Medicines ?Take over-the-counter and prescription medicines only as told by your doctor. ?Take cough medicine only if you are losing sleep. Cough medicine can keep your body from taking mucus away from your lungs. ?If you were prescribed an antibiotic medicine, take it as told by your doctor. Do not stop taking the antibiotic even if you start to feel better. ?Lifestyle ?  ?Do not drink alcohol. ?Do not use any products that contain nicotine or tobacco, such as cigarettes, e-cigarettes, and chewing tobacco. If you need help quitting, ask your doctor. ?Eat a healthy diet. This includes a lot of vegetables, fruits, whole grains, low-fat dairy products, and low-fat (lean) protein. ?General instructions ? ?Rest a lot. Sleep for at least 8 hours each night. ?Sleep with your head and neck raised. Put a few pillows under your head or  sleep in a reclining chair. ?Return to your normal activities as told by your doctor. Ask your doctor what activities are safe for you. ?Drink enough fluid to keep your pee (urine) pale yellow. ?If your throat is sore, rinse your mouth often with salt water. To make salt water, dissolve ?-1 tsp (3-6  g) of salt in 1 cup (237 mL) of warm water. ?Keep all follow-up visits as told by your doctor. This is important. ?How is this prevented? ?You can lower your risk of pneumonia by: ?Getting the pneumonia shot (vaccine). These shots have different types and schedules. Ask your doctor what works best for you. Think about getting this shot if: ?You are older than 76 years of age. ?You are 40-84 years of age and: ?You are being treated for cancer. ?You have long-term lung disease. ?You have other problems that affect your body's defense system. Ask your doctor if you have one of these. ?Getting your flu shot every year. Ask your doctor which type of shot is best for you. ?Going to the dentist as often as told. ?Washing your hands often with soap and water for at least 20 seconds. If you cannot use soap and water, use hand sanitizer. ?Contact a doctor if: ?You have a fever. ?You lose sleep because your cough medicine does not help. ?Get help right away if: ?You are short of breath and this gets worse. ?You have more chest pain. ?Your sickness gets worse. This is very serious if: ?You are an older adult. ?Your body's defense system is weak. ?You cough up blood. ?These symptoms may be an emergency. Do not wait to see if the symptoms will go away. Get medical help right away. Call your local emergency services (911 in the [place] Do not drive yourself to the hospital. ?Summary ?Pneumonia is an infection of the lungs. ?Community-acquired pneumonia affects people who have not been in the hospital. Certain germs can cause this infection. ?This condition may be treated with medicines that kill germs. ?For very bad pneumonia, you may need a hospital stay and treatment to help with breathing. ?This information is not intended to replace advice given to you by your health care provider. Make sure you discuss any questions you have with your health care provider. ?Document Revised: 07/03/2019 Document Reviewed: 07/03/2019 ?Elsevier  Patient Education ? 2022 Elsevier Inc. ? ?

## 2021-12-23 ENCOUNTER — Telehealth: Payer: Self-pay

## 2021-12-23 NOTE — Telephone Encounter (Signed)
Patient informed of md message and recommendations. Verbalized understanding. 

## 2021-12-23 NOTE — Telephone Encounter (Signed)
So she is currently on dual antibiotics.  It can take up to 7 to 10 days for the cough to improve ?I would recommend a follow-up visit early next week to recheck her lungs ?If she feels she is getting worse we could work her in with [facility] or Dr. Adriana Simas Thursday or Friday otherwise it will be early next week ?OTC and prescription cough medicines really will not eliminate this cough because the cough is due to pneumonia in her lungs ? ?If patient is having shortness of breath fever chills very important that she be seen sooner. ?

## 2021-12-23 NOTE — Telephone Encounter (Signed)
Patient calls to ask to see if there is anything else she could be doing to help with recent pneumonia diagnoses. She is taking medications as prescribed and doing fine, she asks is there is a recommended cough syrup or any other recommendations. Please advise  ?

## 2022-02-09 ENCOUNTER — Ambulatory Visit (INDEPENDENT_AMBULATORY_CARE_PROVIDER_SITE_OTHER): Payer: Medicare Other

## 2022-02-09 VITALS — Ht 62.0 in | Wt 152.0 lb

## 2022-02-09 DIAGNOSIS — Z0000 Encounter for general adult medical examination without abnormal findings: Secondary | ICD-10-CM

## 2022-02-09 NOTE — Patient Instructions (Signed)
[name] , ?Thank you for taking time to come for your Medicare Wellness Visit. I appreciate your ongoing commitment to your health goals. Please review the following plan we discussed and let me know if I can assist you in the future.  ? ?Screening recommendations/referrals: ?Colonoscopy: Done 09/21/2018, No longer required. ?Mammogram: Done 09/16/2021 Repeat annually ? ?Bone Density: Done 07/18/2020 Repeat every 2 years ? ?Recommended yearly ophthalmology/optometry visit for glaucoma screening and checkup ?Recommended yearly dental visit for hygiene and checkup ? ?Vaccinations: ?Influenza vaccine: Done 07/29/2021 Repeat annually ? ?Pneumococcal vaccine: Done 12/27/2014 and 03/30/2016 ?Tdap vaccine: Due. Repeat in 10 years ? ?Shingles vaccine: Zoster 07/02/2016, Shingrix 07/07/2021   ?Covid-19:Done 05/15/2021, 08/26/2020, 12/26/2019 and 11/28/2019 ? ?Advanced directives: Please bring a copy of your health care power of attorney and living will to the office to be added to your chart at your convenience. ? ? ?Conditions/risks identified: KEEP UP THE GOOD WORK!! ? ?Next appointment: Follow up in one year for your annual wellness visit 2024. ? ? ?Preventive Care 53 Years and Older, Female ?Preventive care refers to lifestyle choices and visits with your health care provider that can promote health and wellness. ?What does preventive care include? ?A yearly physical exam. This is also called an annual well check. ?Dental exams once or twice a year. ?Routine eye exams. Ask your health care provider how often you should have your eyes checked. ?Personal lifestyle choices, including: ?Daily care of your teeth and gums. ?Regular physical activity. ?Eating a healthy diet. ?Avoiding tobacco and drug use. ?Limiting alcohol use. ?Practicing safe sex. ?Taking low-dose aspirin every day. ?Taking vitamin and mineral supplements as recommended by your health care provider. ?What happens during an annual well check? ?The services and  screenings done by your health care provider during your annual well check will depend on your age, overall health, lifestyle risk factors, and family history of disease. ?Counseling  ?Your health care provider may ask you questions about your: ?Alcohol use. ?Tobacco use. ?Drug use. ?Emotional well-being. ?Home and relationship well-being. ?Sexual activity. ?Eating habits. ?History of falls. ?Memory and ability to understand (cognition). ?Work and work Astronomer. ?Reproductive health. ?Screening  ?You may have the following tests or measurements: ?Height, weight, and BMI. ?Blood pressure. ?Lipid and cholesterol levels. These may be checked every 5 years, or more frequently if you are over 27 years old. ?Skin check. ?Lung cancer screening. You may have this screening every year starting at age 41 if you have a 30-pack-year history of smoking and currently smoke or have quit within the past 15 years. ?Fecal occult blood test (FOBT) of the stool. You may have this test every year starting at age 5. ?Flexible sigmoidoscopy or colonoscopy. You may have a sigmoidoscopy every 5 years or a colonoscopy every 10 years starting at age 8. ?Hepatitis C blood test. ?Hepatitis B blood test. ?Sexually transmitted disease (STD) testing. ?Diabetes screening. This is done by checking your blood sugar (glucose) after you have not eaten for a while (fasting). You may have this done every 1-3 years. ?Bone density scan. This is done to screen for osteoporosis. You may have this done starting at age 26. ?Mammogram. This may be done every 1-2 years. Talk to your health care provider about how often you should have regular mammograms. ?Talk with your health care provider about your test results, treatment options, and if necessary, the need for more tests. ?Vaccines  ?Your health care provider may recommend certain vaccines, such as: ?Influenza vaccine. This is  recommended every year. ?Tetanus, diphtheria, and acellular pertussis (Tdap,  Td) vaccine. You may need a Td booster every 10 years. ?Zoster vaccine. You may need this after age 4. ?Pneumococcal 13-valent conjugate (PCV13) vaccine. One dose is recommended after age 84. ?Pneumococcal polysaccharide (PPSV23) vaccine. One dose is recommended after age 67. ?Talk to your health care provider about which screenings and vaccines you need and how often you need them. ?This information is not intended to replace advice given to you by your health care provider. Make sure you discuss any questions you have with your health care provider. ?Document Released: 10/17/2015 Document Revised: 06/09/2016 Document Reviewed: 07/22/2015 ?Elsevier Interactive Patient Education ? 2017 Elsevier Inc. ? ?Fall Prevention in the Home ?Falls can cause injuries. They can happen to people of all ages. There are many things you can do to make your home safe and to help prevent falls. ?What can I do on the outside of my home? ?Regularly fix the edges of walkways and driveways and fix any cracks. ?Remove anything that might make you trip as you walk through a door, such as a raised step or threshold. ?Trim any bushes or trees on the path to your home. ?Use bright outdoor lighting. ?Clear any walking paths of anything that might make someone trip, such as rocks or tools. ?Regularly check to see if handrails are loose or broken. Make sure that both sides of any steps have handrails. ?Any raised decks and porches should have guardrails on the edges. ?Have any leaves, snow, or ice cleared regularly. ?Use sand or salt on walking paths during winter. ?Clean up any spills in your garage right away. This includes oil or grease spills. ?What can I do in the bathroom? ?Use night lights. ?Install grab bars by the toilet and in the tub and shower. Do not use towel bars as grab bars. ?Use non-skid mats or decals in the tub or shower. ?If you need to sit down in the shower, use a plastic, non-slip stool. ?Keep the floor dry. Clean up any  water that spills on the floor as soon as it happens. ?Remove soap buildup in the tub or shower regularly. ?Attach bath mats securely with double-sided non-slip rug tape. ?Do not have throw rugs and other things on the floor that can make you trip. ?What can I do in the bedroom? ?Use night lights. ?Make sure that you have a light by your bed that is easy to reach. ?Do not use any sheets or blankets that are too big for your bed. They should not hang down onto the floor. ?Have a firm chair that has side arms. You can use this for support while you get dressed. ?Do not have throw rugs and other things on the floor that can make you trip. ?What can I do in the kitchen? ?Clean up any spills right away. ?Avoid walking on wet floors. ?Keep items that you use a lot in easy-to-reach places. ?If you need to reach something above you, use a strong step stool that has a grab bar. ?Keep electrical cords out of the way. ?Do not use floor polish or wax that makes floors slippery. If you must use wax, use non-skid floor wax. ?Do not have throw rugs and other things on the floor that can make you trip. ?What can I do with my stairs? ?Do not leave any items on the stairs. ?Make sure that there are handrails on both sides of the stairs and use them. Fix handrails that are  broken or loose. Make sure that handrails are as long as the stairways. ?Check any carpeting to make sure that it is firmly attached to the stairs. Fix any carpet that is loose or worn. ?Avoid having throw rugs at the top or bottom of the stairs. If you do have throw rugs, attach them to the floor with carpet tape. ?Make sure that you have a light switch at the top of the stairs and the bottom of the stairs. If you do not have them, ask someone to add them for you. ?What else can I do to help prevent falls? ?Wear shoes that: ?Do not have high heels. ?Have rubber bottoms. ?Are comfortable and fit you well. ?Are closed at the toe. Do not wear sandals. ?If you use a  stepladder: ?Make sure that it is fully opened. Do not climb a closed stepladder. ?Make sure that both sides of the stepladder are locked into place. ?Ask someone to hold it for you, if possible. ?[place]

## 2022-02-09 NOTE — Progress Notes (Signed)
? ?Subjective:  ? [name] is a 76 y.o. female who presents for Medicare Annual (Subsequent) preventive examination. ?Virtual Visit via Telephone Note ? ?I connected with  [name] on 02/09/22 at 10:45 AM EDT by telephone and verified that I am speaking with the correct person using two identifiers. ? ?Location: ?Patient: HOME ?Provider: [facility] ?Persons participating in the virtual visit: patient/Nurse Health Advisor ?  ?I discussed the limitations, risks, security and privacy concerns of performing an evaluation and management service by telephone and the availability of in person appointments. The patient expressed understanding and agreed to proceed. ? ?Interactive audio and video telecommunications were attempted between this nurse and patient, however failed, due to patient having technical difficulties OR patient did not have access to video capability.  We continued and completed visit with audio only. ? ?Some vital signs may be absent or patient reported.  ? ?Emily Dash, LPN ? ?Review of Systems    ? ?Cardiac Risk Factors include: advanced age (>69men, >90 women);dyslipidemia;sedentary lifestyle;Other (see comment), Risk factor comments: Arthritis ? ?   ?Objective:  ?  ?Today's Vitals  ? 02/09/22 1049  ?Weight: 152 lb (68.9 kg)  ?Height: 5\' 2"  (1.575 m)  ? ?Body mass index is 27.8 kg/m?. ? ? ?  02/09/2022  ? 10:58 AM 12/17/2021  ?  3:30 PM 02/03/2021  ? 11:07 AM 11/09/2019  ?  8:24 AM 09/21/2018  ?  7:27 AM 06/30/2016  ? 12:47 PM 08/25/2011  ? 11:21 AM  ?Advanced Directives  ?Does Patient Have a Medical Advance Directive? No No No No No Yes Patient does not have advance directive;Patient would not like information  ?Type of Advance Directive      Living will;Healthcare Power of Attorney   ?Does patient want to make changes to medical advance directive?      No - Patient declined   ?Copy of Healthcare Power of Attorney in Chart?      No - copy requested   ?Would patient like information on  creating a medical advance directive? No - Patient declined No - Patient declined No - Patient declined No - Patient declined No - Patient declined    ?Pre-existing out of facility DNR order (yellow form or pink MOST form)       No  ? ? ?Current Medications (verified) ?Outpatient Encounter Medications as of 02/09/2022  ?Medication Sig  ? Apremilast (OTEZLA) 30 MG TABS Take 1 tablet (30 mg total) by mouth 2 (two) times daily.  ? Cholecalciferol (VITAMIN D3) 50 MCG (2000 UT) TABS Take 2,000 Units by mouth every evening.  ? rosuvastatin (CRESTOR) 5 MG tablet Take 1 tablet (5 mg total) by mouth daily.  ? [DISCONTINUED] amoxicillin-clavulanate (AUGMENTIN) 875-125 MG tablet Take 1 tablet by mouth 2 (two) times daily.  ? [DISCONTINUED] benzonatate (TESSALON) 200 MG capsule Take 1 capsule (200 mg total) by mouth 3 (three) times daily as needed for cough. Swallow whole, do not chew  ? [DISCONTINUED] predniSONE (DELTASONE) 20 MG tablet Take 2 tablets (40 mg total) by mouth daily.  ? ?No facility-administered encounter medications on file as of 02/09/2022.  ? ? ?Allergies (verified) ?Etodolac  ? ?History: ?Past Medical History:  ?Diagnosis Date  ? Anemia 1986  ? Cataract Have but not ready to be removed.  ? Hx of colonic polyps   ? Hypercholesterolemia   ? Psoriatic arthritis (HCC)   ? ?Past Surgical History:  ?Procedure Laterality Date  ? ABDOMINAL HYSTERECTOMY    ?  COLONOSCOPY  08/25/2011  ? Procedure: COLONOSCOPY;  Surgeon: Malissa Hippo, MD;  Location: [facility];  Service: Endoscopy;  Laterality: N/A;  12:00  ? COLONOSCOPY N/A 09/21/2018  ? Procedure: COLONOSCOPY;  Surgeon: Malissa Hippo, MD;  Location: [facility];  Service: Endoscopy;  Laterality: N/A;  830  ? left knee stable out  10/04/1964  ? left knee staple in  10/04/1960  ? VESICOVAGINAL FISTULA CLOSURE W/ TAH  10/04/1984  ? ?Family History  ?Problem Relation Age of Onset  ? Anesthesia problems Mother   ? Cancer Mother   ? Hypertension Mother   ? Breast  cancer Mother   ? Arthritis Mother   ? Obesity Mother   ? Cancer Father   ? Diabetes Father   ? Deep vein thrombosis Father   ? Hypertension Father   ? Hypertension Sister   ? Diabetes Sister   ? Hearing loss Sister   ? Obesity Sister   ? Migraines Sister   ? Migraines Sister   ? Hypertension Brother   ? Heart disease Brother   ? Cancer Brother   ? Obesity Brother   ? Early death Brother   ? Heart Problems Brother   ?     Leaky valve  ? Alcohol abuse Maternal Uncle   ? Arthritis Other   ?     FH  ? Diabetes Other   ?     FH  ? Cancer Other   ?     FH  ? Hypotension Neg Hx   ? Malignant hyperthermia Neg Hx   ? Pseudochol deficiency Neg Hx   ? ?Social History  ? ?Socioeconomic History  ? Marital status: Widowed  ?  Spouse name: Not on file  ? Number of children: 0  ? Years of education: Not on file  ? Highest education level: Not on file  ?Occupational History  ? Not on file  ?Tobacco Use  ? Smoking status: Never  ?  Passive exposure: Past  ? Smokeless tobacco: Never  ? Tobacco comments:  ?  Never smoked.  ?Vaping Use  ? Vaping Use: Never used  ?Substance and Sexual Activity  ? Alcohol use: No  ? Drug use: No  ? Sexual activity: Not Currently  ?  Birth control/protection: Abstinence  ?Other Topics Concern  ? Not on file  ?Social History Narrative  ? Retired, Airline pilot.  ? 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  ? No biological children, but has fostered many.  ? Several siblings living.   ? ?Social Determinants of Health  ? ?Financial Resource Strain: Low Risk   ? Difficulty of Paying Living Expenses: Not hard at all  ?Food Insecurity: No Food Insecurity  ? Worried About Programme researcher, broadcasting/film/video in the Last Year: Never true  ? Ran Out of Food in the Last Year: Never true  ?Transportation Needs: No Transportation Needs  ? Lack of Transportation (Medical): No  ? Lack of Transportation (Non-Medical): No  ?Physical Activity: Sufficiently Active  ? Days of Exercise per Week: 4 days  ? Minutes of Exercise per Session: 60 min  ?Stress: No  Stress Concern Present  ? Feeling of Stress : Only a little  ?Social Connections: Moderately Integrated  ? Frequency of Communication with Friends and Family: More than three times a week  ? Frequency of Social Gatherings with Friends and Family: Three times a week  ? Attends Religious Services: More than 4 times per year  ? Active Member of Clubs or  Organizations: Yes  ? Attends BankerClub or Organization Meetings: More than 4 times per year  ? Marital Status: Widowed  ? ? ?Tobacco Counseling ?Counseling given: Not Answered ?Tobacco comments: Never smoked. ? ? ?Clinical Intake: ? ?Pre-visit preparation completed: Yes ? ?Pain : No/denies pain ? ?  ? ?BMI - recorded: 27.8 ?Nutritional Status: BMI 25 -29 Overweight ?Nutritional Risks: None ?Diabetes: No ? ?How often do you need to have someone help you when you read instructions, pamphlets, or other written materials from your doctor or pharmacy?: 1 - Never ? ?Diabetic?NO ? ?Interpreter Needed?: No ? ?Information entered by :: mj Bertrice Leder, lpn ? ? ?Activities of Daily Living ? ?  02/09/2022  ? 11:05 AM 02/02/2022  ?  5:36 PM  ?In your present state of health, do you have any difficulty performing the following activities:  ?Hearing? 0 0  ?Vision? 0 0  ?Difficulty concentrating or making decisions? 0 0  ?Walking or climbing stairs? 0 0  ?Dressing or bathing? 0 0  ?Doing errands, shopping? 0 0  ?Preparing Food and eating ? N N  ?Using the Toilet? N N  ?In the past six months, have you accidently leaked urine? N N  ?Do you have problems with loss of bowel control? N N  ?Managing your Medications? N N  ?Managing your Finances? N   ?Housekeeping or managing your Housekeeping? N N  ? ? ?Patient Care Team: ?Babs SciaraLuking, Scott A, MD as PCP - General (Family Medicine) ? ?Indicate any recent Medical Services you may have received from other than [facility] providers in the past year (date may be approximate). ? ?   ?Assessment:  ? This is a routine wellness examination for  [name]. ? ?Hearing/Vision screen ?Hearing Screening - Comments:: No hearing issues.  ?Vision Screening - Comments:: Glasses. Dr. Dione BoozeGroat. 2022. ? ?Dietary issues and exercise activities discussed: ?Current Exercise Habits: Home exerc

## 2022-02-22 ENCOUNTER — Other Ambulatory Visit: Payer: Self-pay | Admitting: Family Medicine

## 2022-03-08 ENCOUNTER — Telehealth: Payer: Self-pay | Admitting: *Deleted

## 2022-03-08 NOTE — Telephone Encounter (Signed)
Patient says she was at short stay at [facility] with her sister on Tuesday, that night pt says she started feeling weak, no other symptoms. She says she was so weak Wednesday that she just stayed in the bed. She says she feels fine but just feeling weak. Does she need to make an appointment?  Please advise. Thank you

## 2022-03-08 NOTE — Telephone Encounter (Signed)
I believe it would be advisable for her to do a visit this week with me.  Feeling weak can be so many things.  We may also have to order some lab work after seeing her.  Thanks-Dr. Lorin Picket

## 2022-03-08 NOTE — Telephone Encounter (Signed)
Patient advised of md message and appointment scheduled.

## 2022-03-10 ENCOUNTER — Ambulatory Visit: Payer: Medicare Other | Admitting: Family Medicine

## 2022-03-15 ENCOUNTER — Ambulatory Visit (INDEPENDENT_AMBULATORY_CARE_PROVIDER_SITE_OTHER): Payer: Medicare Other | Admitting: Family Medicine

## 2022-03-15 ENCOUNTER — Encounter: Payer: Self-pay | Admitting: Family Medicine

## 2022-03-15 VITALS — BP 128/62 | HR 67 | Temp 97.9°F | Wt 148.6 lb

## 2022-03-15 DIAGNOSIS — R531 Weakness: Secondary | ICD-10-CM

## 2022-03-15 NOTE — Patient Instructions (Signed)
Activity/diet like normal.  Labs today. We will call with results.  Call or message with concerns.  Take care  Dr. Adriana Simas

## 2022-03-16 DIAGNOSIS — R531 Weakness: Secondary | ICD-10-CM | POA: Insufficient documentation

## 2022-03-16 LAB — CMP14+EGFR
ALT: 17 IU/L (ref 0–32)
AST: 25 IU/L (ref 0–40)
Albumin/Globulin Ratio: 1.4 (ref 1.2–2.2)
Albumin: 4.3 g/dL (ref 3.7–4.7)
Alkaline Phosphatase: 115 IU/L (ref 44–121)
BUN/Creatinine Ratio: 9 — ABNORMAL LOW (ref 12–28)
BUN: 8 mg/dL (ref 8–27)
Bilirubin Total: 0.5 mg/dL (ref 0.0–1.2)
CO2: 20 mmol/L (ref 20–29)
Calcium: 9.6 mg/dL (ref 8.7–10.3)
Chloride: 105 mmol/L (ref 96–106)
Creatinine, Ser: 0.91 mg/dL (ref 0.57–1.00)
Globulin, Total: 3 g/dL (ref 1.5–4.5)
Glucose: 81 mg/dL (ref 70–99)
Potassium: 4 mmol/L (ref 3.5–5.2)
Sodium: 142 mmol/L (ref 134–144)
Total Protein: 7.3 g/dL (ref 6.0–8.5)
eGFR: 66 mL/min/{1.73_m2} (ref >59)

## 2022-03-16 LAB — CBC
Hematocrit: 39.2 % (ref 34.0–46.6)
Hemoglobin: 12.6 g/dL (ref 11.1–15.9)
MCH: 23.5 pg — ABNORMAL LOW (ref 26.6–33.0)
MCHC: 32.1 g/dL (ref 31.5–35.7)
MCV: 73 fL — ABNORMAL LOW (ref 79–97)
Platelets: 318 10*3/uL (ref 150–450)
RBC: 5.37 x10E6/uL — ABNORMAL HIGH (ref 3.77–5.28)
RDW: 14.9 % (ref 11.7–15.4)
WBC: 7.7 10*3/uL (ref 3.4–10.8)

## 2022-03-16 LAB — CK: Total CK: 112 U/L (ref 32–182)

## 2022-03-16 LAB — TSH: TSH: 0.703 u[IU]/mL (ref 0.450–4.500)

## 2022-03-16 NOTE — Assessment & Plan Note (Signed)
Patient states that she is feeling well and is back to her normal self.  We discussed watchful waiting versus pursuing laboratory work-up to ensure no underlying abnormalities.  Patient elected for laboratory studies.  Awaiting test results.

## 2022-03-16 NOTE — Progress Notes (Signed)
Subjective:  Patient ID: [name], female    DOB: [birth year]  Age: 76 y.o. MRN: [id number]  CC: Chief Complaint  Patient presents with   Fatigue    Began on May 30 but has improved as of Thursday. Some days felt dizziness and fuzziness in head; some days felt fine.     HPI:  76 year old female presents for evaluation of the above.  Patient states that on 5/30 she developed fatigue and generalized weakness.  She states that she was in the bed quite a bit.  She reports some associated dizziness/lightheadedness.  Also reports that she had a funny feeling in her stomach.  Over the next several days symptoms seem to improve and she feels like she is currently at her baseline.  She is unsure of what the etiology was but is currently feeling well.  Has no symptoms at this time.  She is taking Crestor.  No change to her medications recently.  No other complaints or concerns at this time.  Patient Active Problem List   Diagnosis Date Noted   Generalized weakness 03/16/2022   Bilateral high frequency sensorineural hearing loss 06/28/2019   History of hyperlipidemia 02/09/2017   Other psoriasis 12/30/2016   High risk medications (not anticoagulants) long-term use 12/30/2016   Psoriatic arthritis ([place]) 12/27/2014   Vitamin D deficiency 08/06/2013   Osteopenia 08/06/2013   Insomnia 02/22/2013   Hyperlipidemia 01/09/2013   Osteoarthritis of shoulder 09/15/2010    Social Hx   Social History   Socioeconomic History   Marital status: Widowed    Spouse name: Not on file   Number of children: 0   Years of education: Not on file   Highest education level: Not on file  Occupational History   Not on file  Tobacco Use   Smoking status: Never    Passive exposure: Past   Smokeless tobacco: Never   Tobacco comments:    Never smoked.  Vaping Use   Vaping Use: Never used  Substance and Sexual Activity   Alcohol use: No   Drug use: No   Sexual activity: Not Currently    Birth  control/protection: Abstinence  Other Topics Concern   Not on file  Social History Narrative   Retired, Optometrist.   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   No biological children, but has fostered many.   Several siblings living.    Social Determinants of Health   Financial Resource Strain: Low Risk  (02/09/2022)   Overall Financial Resource Strain (CARDIA)    Difficulty of Paying Living Expenses: Not hard at all  Food Insecurity: No Food Insecurity (02/09/2022)   Hunger Vital Sign    Worried About Running Out of Food in the Last Year: Never true    Ran Out of Food in the Last Year: Never true  Transportation Needs: No Transportation Needs (02/09/2022)   PRAPARE - Hydrologist (Medical): No    Lack of Transportation (Non-Medical): No  Physical Activity: Sufficiently Active (02/09/2022)   Exercise Vital Sign    Days of Exercise per Week: 4 days    Minutes of Exercise per Session: 60 min  Stress: No Stress Concern Present (02/09/2022)   [place]    Feeling of Stress : Only a little  Social Connections: Moderately Integrated (02/09/2022)   Social Connection and Isolation Panel [NHANES]    Frequency of Communication with Friends and Family: More than three times a week  Frequency of Social Gatherings with Friends and Family: Three times a week    Attends Religious Services: More than 4 times per year    Active Member of Clubs or Organizations: Yes    Attends Archivist Meetings: More than 4 times per year    Marital Status: Widowed    Review of Systems Per HPI  Objective:  BP 128/62   Pulse 67   Temp 97.9 F (36.6 C)   Wt 148 lb 9.6 oz (67.4 kg)   SpO2 97%   BMI 27.18 kg/m      03/15/2022    1:50 PM 02/09/2022   10:49 AM 12/21/2021    4:55 PM  BP/Weight  Systolic BP 638  453  Diastolic BP 62  70  Wt. (Lbs) 148.6 152   BMI 27.18 kg/m2 27.8 kg/m2     Physical Exam Vitals and  nursing note reviewed.  Constitutional:      General: She is not in acute distress.    Appearance: Normal appearance.  HENT:     Head: Normocephalic and atraumatic.  Eyes:     General:        Right eye: No discharge.        Left eye: No discharge.     Conjunctiva/sclera: Conjunctivae normal.  Cardiovascular:     Rate and Rhythm: Normal rate and regular rhythm.  Pulmonary:     Effort: Pulmonary effort is normal.     Breath sounds: Normal breath sounds.  Neurological:     General: No focal deficit present.     Mental Status: She is alert.  Psychiatric:        Mood and Affect: Mood normal.        Behavior: Behavior normal.     Lab Results  Component Value Date   WBC 7.7 03/15/2022   HGB 12.6 03/15/2022   HCT 39.2 03/15/2022   PLT 318 03/15/2022   GLUCOSE 81 03/15/2022   CHOL 179 11/30/2021   TRIG 46 11/30/2021   HDL 77 11/30/2021   LDLCALC 93 11/30/2021   ALT 17 03/15/2022   AST 25 03/15/2022   NA 142 03/15/2022   K 4.0 03/15/2022   CL 105 03/15/2022   CREATININE 0.91 03/15/2022   BUN 8 03/15/2022   CO2 20 03/15/2022   TSH 0.703 03/15/2022     Assessment & Plan:   Problem List Items Addressed This Visit       Other   Generalized weakness - Primary    Patient states that she is feeling well and is back to her normal self.  We discussed watchful waiting versus pursuing laboratory work-up to ensure no underlying abnormalities.  Patient elected for laboratory studies.  Awaiting test results.      Relevant Orders   CBC (Completed)   CMP14+EGFR (Completed)   TSH (Completed)   CK (Completed)   Neela Zecca Lacinda Axon DO [place]

## 2022-04-13 NOTE — Progress Notes (Signed)
Office Visit Note  Patient: [name]             Date of Birth: March 27, 1946           MRN: [id number]             PCP: Babs Sciara, MD Referring: Babs Sciara, MD Visit Date: 04/27/2022 Occupation: @GUAROCC @  Subjective:  Medication management  History of Present Illness: [name] is a 76 y.o. female with history of psoriatic arthritis, psoriasis and osteoarthritis.  She states that she has been taking Otezla 30 mg twice a day without any side effects.  She denies any increased joint pain or joint swelling.  She denies any psoriasis.  Activities of Daily Living:  Patient reports morning stiffness for 0 minutes.   Patient Denies nocturnal pain.  Difficulty dressing/grooming: Denies Difficulty climbing stairs: Denies Difficulty getting out of chair: Denies Difficulty using hands for taps, buttons, cutlery, and/or writing: Denies  Review of Systems  Constitutional:  Negative for fatigue.  HENT:  Negative for mouth sores and mouth dryness.   Eyes:  Negative for dryness.  Respiratory:  Negative for shortness of breath.   Cardiovascular:  Negative for chest pain and palpitations.  Gastrointestinal:  Negative for blood in stool, constipation and diarrhea.  Endocrine: Negative for increased urination.  Genitourinary:  Negative for involuntary urination.  Musculoskeletal:  Negative for joint pain, joint pain, joint swelling, myalgias, muscle weakness, morning stiffness, muscle tenderness and myalgias.  Skin:  Negative for color change, rash, hair loss and sensitivity to sunlight.  Allergic/Immunologic: Negative for susceptible to infections.  Neurological:  Negative for dizziness and headaches.  Hematological:  Negative for swollen glands.  Psychiatric/Behavioral:  Positive for sleep disturbance. Negative for depressed mood. The patient is not nervous/anxious.     PMFS History:  Patient Active Problem List   Diagnosis Date Noted   Generalized weakness  03/16/2022   Bilateral high frequency sensorineural hearing loss 06/28/2019   History of hyperlipidemia 02/09/2017   Other psoriasis 12/30/2016   High risk medications (not anticoagulants) long-term use 12/30/2016   Psoriatic arthritis ([facility]) 12/27/2014   Vitamin D deficiency 08/06/2013   Osteopenia 08/06/2013   Insomnia 02/22/2013   Hyperlipidemia 01/09/2013   Osteoarthritis of shoulder 09/15/2010    Past Medical History:  Diagnosis Date   Anemia 1986   Cataract Have but not ready to be removed.   Hx of colonic polyps    Hypercholesterolemia    Psoriatic arthritis (HCC)     Family History  Problem Relation Age of Onset   Anesthesia problems Mother    Cancer Mother    Hypertension Mother    Breast cancer Mother    Arthritis Mother    Obesity Mother    Cancer Father    Diabetes Father    Deep vein thrombosis Father    Hypertension Father    Hypertension Sister    Diabetes Sister    Hearing loss Sister    Obesity Sister    Migraines Sister    Migraines Sister    Hypertension Brother    Heart disease Brother    Cancer Brother    Obesity Brother    Early death Brother    Heart Problems Brother        Leaky valve   Alcohol abuse Maternal Uncle    Arthritis Other        FH   Diabetes Other        FH   Cancer  Other        FH   Hypotension Neg Hx    Malignant hyperthermia Neg Hx    Pseudochol deficiency Neg Hx    Past Surgical History:  Procedure Laterality Date   ABDOMINAL HYSTERECTOMY     COLONOSCOPY  08/25/2011   Procedure: COLONOSCOPY;  Surgeon: Malissa Hippo, MD;  [facility]: [facility];  Service: Endoscopy;  Laterality: N/A;  12:00   COLONOSCOPY N/A 09/21/2018   Procedure: COLONOSCOPY;  Surgeon: Malissa Hippo, MD;  Location: [facility];  Service: Endoscopy;  Laterality: N/A;  830   left knee stable out  10/04/1964   left knee staple in  10/04/1960   VESICOVAGINAL FISTULA CLOSURE W/ TAH  10/04/1984   Social History   Social History Narrative    Retired, Airline pilot.   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   No biological children, but has fostered many.   Several siblings living.    Immunization History  Administered Date(s) Administered   Fluad Quad(high Dose 65+) 10/02/2020, 07/29/2021   Influenza,inj,Quad PF,6+ Mos 06/19/2019   Influenza-Unspecified 07/13/2016, 08/02/2018   Moderna Sars-Covid-2 Vaccination 11/28/2019, 12/26/2019, 08/26/2020, 05/15/2021   PPD Test 11/06/2019   Pneumococcal Conjugate-13 12/27/2014   Pneumococcal Polysaccharide-23 03/30/2016   Pneumococcal-Unspecified 11/06/2009   Zoster Recombinat (Shingrix) 07/07/2021   Zoster, Live 07/02/2016     Objective: Vital Signs: BP 138/75 (BP Location: Left Arm, Patient Position: Sitting, Cuff Size: Normal)   Pulse (!) 49   Ht 5\' 2"  (1.575 m)   Wt 152 lb (68.9 kg)   BMI 27.80 kg/m    Physical Exam Vitals and nursing note reviewed.  Constitutional:      Appearance: She is well-developed.  HENT:     Head: Normocephalic and atraumatic.  Eyes:     Conjunctiva/sclera: Conjunctivae normal.  Cardiovascular:     Rate and Rhythm: Normal rate and regular rhythm.     Heart sounds: Normal heart sounds.  Pulmonary:     Effort: Pulmonary effort is normal.     Breath sounds: Normal breath sounds.  Abdominal:     General: Bowel sounds are normal.     Palpations: Abdomen is soft.  Musculoskeletal:     Cervical back: Normal range of motion.  Lymphadenopathy:     Cervical: No cervical adenopathy.  Skin:    General: Skin is warm and dry.     Capillary Refill: Capillary refill takes less than 2 seconds.  Neurological:     Mental Status: She is alert and oriented to person, place, and time.  Psychiatric:        Behavior: Behavior normal.      Musculoskeletal Exam: C-spine, thoracic and lumbar spine were in good range of motion.  She had no SI joint tenderness.  Shoulder joints, elbow joints, wrist joints, MCPs PIPs and DIPs with good range of motion with no synovitis. She  had limited abduction of the hip joints.  Knee joints, ankles, MTPs and PIPs with good range of motion with no synovitis.  CDAI Exam: CDAI Score: -- Patient Global: --; Provider Global: -- Swollen: --; Tender: -- Joint Exam 04/27/2022   No joint exam has been documented for this visit   There is currently no information documented on the homunculus. Go to the Rheumatology activity and complete the homunculus joint exam.  Investigation: No additional findings.  Imaging: No results found.  Recent Labs: Lab Results  Component Value Date   WBC 7.7 03/15/2022   HGB 12.6 03/15/2022   PLT 318  03/15/2022   NA 142 03/15/2022   K 4.0 03/15/2022   CL 105 03/15/2022   CO2 20 03/15/2022   GLUCOSE 81 03/15/2022   BUN 8 03/15/2022   CREATININE 0.91 03/15/2022   BILITOT 0.5 03/15/2022   ALKPHOS 115 03/15/2022   AST 25 03/15/2022   ALT 17 03/15/2022   PROT 7.3 03/15/2022   ALBUMIN 4.3 03/15/2022   CALCIUM 9.6 03/15/2022   GFRAA 73 08/04/2020    Speciality Comments: No specialty comments available.  Procedures:  No procedures performed Allergies: Etodolac   Assessment / Plan:     Visit Diagnoses: Psoriatic arthritis (HCC)-she denies any joint pain or joint swelling.No synovitis was noted on the examination today.  She has been taking Otezla 30 mg p.o. twice daily without any side effects.  Other psoriasis-she had no active psoriasis lesions.  High risk medications (not anticoagulants) long-term use - Otezla 30 mg 1 tablet by mouth twice daily.  Labs obtained on March 15, 2022 were within normal limits.  Primary osteoarthritis of left knee -she denies any discomfort in her knee joints.  X-rays of the left knee were obtained on 04/11/2019 which were consistent with severe chondromalacia patella and moderate osteoarthritis.  Lower extremity muscle strengthening exercises were discussed.  Osteopenia of multiple sites - DEXA 07/18/2020 The BMD measured at Forearm Radius 33% is 0.570  g/cm2 with a T-score of -2.0.  She was advised to have repeat DEXA scan through her PCP.  Vitamin D deficiency-she is on vitamin D supplement.  Other insomnia-patient complains of insomnia despite trying different medications.  Good sleep hygiene was discussed.  History of hyperlipidemia  History of anemia  Orders: No orders of the defined types were placed in this encounter.  No orders of the defined types were placed in this encounter.   Follow-Up Instructions: Return in about 5 months (around 09/27/2022) for Psoriatic arthritis, Osteoarthritis.   Pollyann Savoy, MD  Note - This record has been created using Animal nutritionist.  Chart creation errors have been sought, but may not always  have been located. Such creation errors do not reflect on  the standard of medical care.

## 2022-04-27 ENCOUNTER — Ambulatory Visit: Payer: Medicare Other | Admitting: Rheumatology

## 2022-04-27 ENCOUNTER — Encounter: Payer: Self-pay | Admitting: Rheumatology

## 2022-04-27 VITALS — BP 138/75 | HR 49 | Ht 62.0 in | Wt 152.0 lb

## 2022-04-27 DIAGNOSIS — Z79899 Other long term (current) drug therapy: Secondary | ICD-10-CM | POA: Diagnosis not present

## 2022-04-27 DIAGNOSIS — M1712 Unilateral primary osteoarthritis, left knee: Secondary | ICD-10-CM

## 2022-04-27 DIAGNOSIS — L4050 Arthropathic psoriasis, unspecified: Secondary | ICD-10-CM

## 2022-04-27 DIAGNOSIS — E559 Vitamin D deficiency, unspecified: Secondary | ICD-10-CM

## 2022-04-27 DIAGNOSIS — M8589 Other specified disorders of bone density and structure, multiple sites: Secondary | ICD-10-CM

## 2022-04-27 DIAGNOSIS — L408 Other psoriasis: Secondary | ICD-10-CM | POA: Diagnosis not present

## 2022-04-27 DIAGNOSIS — G4709 Other insomnia: Secondary | ICD-10-CM

## 2022-04-27 DIAGNOSIS — Z8639 Personal history of other endocrine, nutritional and metabolic disease: Secondary | ICD-10-CM

## 2022-04-27 DIAGNOSIS — Z862 Personal history of diseases of the blood and blood-forming organs and certain disorders involving the immune mechanism: Secondary | ICD-10-CM

## 2022-05-07 ENCOUNTER — Other Ambulatory Visit: Payer: Self-pay | Admitting: *Deleted

## 2022-05-07 MED ORDER — OTEZLA 30 MG PO TABS: 1.0000 | ORAL_TABLET | Freq: Two times a day (BID) | ORAL | 0 refills | Status: DC

## 2022-05-07 NOTE — Telephone Encounter (Signed)
Refill request received via fax from [employer] for [place] [place].  Next Visit: 09/23/2022  Last Visit: 04/27/2022  Last Fill: 06/19/2021  DX: Psoriatic arthritis   Current Dose per office note 04/27/2022: [name] 30 mg 1 tablet by mouth twice daily  Labs: per office note 04/27/2022 Labs obtained on March 15, 2022 were within normal limits.  Okay to refill [name]?

## 2022-06-03 LAB — HEPATIC FUNCTION PANEL
ALT: 12 IU/L (ref 0–32)
AST: 23 IU/L (ref 0–40)
Albumin: 4.5 g/dL (ref 3.8–4.8)
Alkaline Phosphatase: 110 IU/L (ref 44–121)
Bilirubin Total: 0.4 mg/dL (ref 0.0–1.2)
Bilirubin, Direct: 0.12 mg/dL (ref 0.00–0.40)
Total Protein: 6.8 g/dL (ref 6.0–8.5)

## 2022-06-03 LAB — LIPID PANEL
Chol/HDL Ratio: 2.5 ratio (ref 0.0–4.4)
Cholesterol, Total: 189 mg/dL (ref 100–199)
HDL: 76 mg/dL (ref >39)
LDL Chol Calc (NIH): 101 mg/dL — ABNORMAL HIGH (ref 0–99)
Triglycerides: 64 mg/dL (ref 0–149)
VLDL Cholesterol Cal: 12 mg/dL (ref 5–40)

## 2022-06-03 LAB — BASIC METABOLIC PANEL
BUN/Creatinine Ratio: 10 — ABNORMAL LOW (ref 12–28)
BUN: 11 mg/dL (ref 8–27)
CO2: 18 mmol/L — ABNORMAL LOW (ref 20–29)
Calcium: 9.8 mg/dL (ref 8.7–10.3)
Chloride: 107 mmol/L — ABNORMAL HIGH (ref 96–106)
Creatinine, Ser: 1.05 mg/dL — ABNORMAL HIGH (ref 0.57–1.00)
Glucose: 85 mg/dL (ref 70–99)
Potassium: 4.2 mmol/L (ref 3.5–5.2)
Sodium: 143 mmol/L (ref 134–144)
eGFR: 55 mL/min/{1.73_m2} — ABNORMAL LOW (ref >59)

## 2022-06-08 ENCOUNTER — Ambulatory Visit: Payer: Medicare Other | Admitting: Family Medicine

## 2022-06-08 ENCOUNTER — Encounter: Payer: Self-pay | Admitting: Family Medicine

## 2022-06-08 VITALS — BP 125/69 | HR 53 | Temp 97.2°F | Ht 62.0 in | Wt 147.0 lb

## 2022-06-08 DIAGNOSIS — E559 Vitamin D deficiency, unspecified: Secondary | ICD-10-CM

## 2022-06-08 DIAGNOSIS — M8589 Other specified disorders of bone density and structure, multiple sites: Secondary | ICD-10-CM

## 2022-06-08 DIAGNOSIS — E785 Hyperlipidemia, unspecified: Secondary | ICD-10-CM

## 2022-06-08 DIAGNOSIS — F5101 Primary insomnia: Secondary | ICD-10-CM | POA: Diagnosis not present

## 2022-06-08 MED ORDER — ROSUVASTATIN CALCIUM 5 MG PO TABS: 5.0000 mg | ORAL_TABLET | Freq: Every day | ORAL | 3 refills | Status: DC

## 2022-06-08 NOTE — Progress Notes (Signed)
   Subjective:    Patient ID: [name], female    DOB: 1946/06/25, 76 y.o.   MRN: [id number]  Hyperlipidemia This is a chronic problem. Treatments tried: rosuvastatin.   6 month follow up  C/o having trouble sleeping, waking up at night  Good discussion with the patient regarding sleep regarding options regarding behavioral approaches  Patient is walking 3 times per week and also going to the gym twice a week Osteopenia of multiple sites - Plan: DG Bone Density  Hyperlipidemia, unspecified hyperlipidemia type  Primary insomnia  Vitamin D deficiency - Plan: DG Bone Density  Review of Systems     Objective:   Physical Exam  General-in no acute distress Eyes-no discharge Lungs-respiratory rate normal, CTA CV-no murmurs,RRR Extremities skin warm dry no edema Neuro grossly normal Behavior normal, alert  Vaccines discussed including Senior dose this fall, shingles vaccine completing it, new COVID booster     Assessment & Plan:  1. Osteopenia of multiple sites Recommend bone density this to be done later this year - DG Bone Density  2. Hyperlipidemia, unspecified hyperlipidemia type Continue medication watch diet stay physically active  3. Primary insomnia Behavioral approaches discussed avoid medications  4. Vitamin D deficiency Bone density upcoming - DG Bone Density

## 2022-07-23 ENCOUNTER — Telehealth: Payer: Self-pay | Admitting: Rheumatology

## 2022-07-23 NOTE — Telephone Encounter (Signed)
Spoke with patient and advised we are looking into what the problem is and is should be resolved prior to her appointment.

## 2022-07-23 NOTE — Telephone Encounter (Signed)
Patient left a voicemail stating she received a letter from her insurance company [employer] stating [name] is not a participating provider for her plan.  Patient states she doesn't want to incur additional expenses and requested a return call to let her know if she needs to schedule with Dr. Estanislado Pandy.

## 2022-07-26 ENCOUNTER — Ambulatory Visit (HOSPITAL_COMMUNITY)
Admission: RE | Admit: 2022-07-26 | Discharge: 2022-07-26 | Disposition: A | Discharge status: Home / Self Care | Payer: Medicare Other | Source: Ambulatory Visit | Attending: Family Medicine | Admitting: Family Medicine

## 2022-07-26 DIAGNOSIS — E559 Vitamin D deficiency, unspecified: Secondary | ICD-10-CM | POA: Diagnosis present

## 2022-07-26 DIAGNOSIS — M8589 Other specified disorders of bone density and structure, multiple sites: Secondary | ICD-10-CM | POA: Diagnosis present

## 2022-08-03 ENCOUNTER — Other Ambulatory Visit: Payer: Self-pay | Admitting: Family Medicine

## 2022-08-03 DIAGNOSIS — Z1231 Encounter for screening mammogram for malignant neoplasm of breast: Secondary | ICD-10-CM

## 2022-09-13 NOTE — Progress Notes (Unsigned)
Office Visit Note  Patient: [name]             Date of Birth: December 25, 1945           MRN: [id number]             PCP: Babs Sciara, MD Referring: Babs Sciara, MD Visit Date: 09/23/2022 Occupation: @GUAROCC @  Subjective:  Medication monitoring   History of Present Illness: [name] is a 76 y.o. female with history of psoriatic arthritis, osteoarthritis, and osteopenia.  Patient is currently taking Otezla 30 mg 1 tablet by mouth twice daily.  She has been tolerating Otezla without any side effects.  Patient reports that she often misses the morning dose due to eating breakfast later in the day but has been taking her evening dose consistently.  She denies any signs or symptoms of a psoriatic arthritis flare.  She remains active walking 3 miles 3 days a week and going to the [facility] twice a week for exercise.  She denies any SI joint pain.  She has not had any Achilles tendinitis or plantar fasciitis.  She has occasional flares of psoriasis on her elbows and uses an over-the-counter topical agent which alleviates the patches.  She denies any new medical conditions.  She denies any recent or recurrent infections.    Activities of Daily Living:  Patient reports morning stiffness for 0 minutes.   Patient Denies nocturnal pain.  Difficulty dressing/grooming: Denies Difficulty climbing stairs: Denies Difficulty getting out of chair: Denies Difficulty using hands for taps, buttons, cutlery, and/or writing: Denies  Review of Systems  Constitutional:  Negative for fatigue.  HENT:  Negative for mouth sores and mouth dryness.   Eyes:  Negative for dryness.  Respiratory:  Negative for shortness of breath.   Cardiovascular:  Negative for chest pain and palpitations.  Gastrointestinal:  Negative for blood in stool, constipation and diarrhea.  Endocrine: Negative for increased urination.  Genitourinary:  Negative for involuntary urination.  Musculoskeletal:  Negative for  joint pain, gait problem, joint pain, joint swelling, myalgias, muscle weakness, morning stiffness, muscle tenderness and myalgias.  Skin:  Negative for color change, rash, hair loss and sensitivity to sunlight.  Allergic/Immunologic: Negative for susceptible to infections.  Neurological:  Negative for dizziness and headaches.  Hematological:  Negative for swollen glands.  Psychiatric/Behavioral:  Positive for sleep disturbance. Negative for depressed mood. The patient is not nervous/anxious.     PMFS History:  Patient Active Problem List   Diagnosis Date Noted   Generalized weakness 03/16/2022   Bilateral high frequency sensorineural hearing loss 06/28/2019   History of hyperlipidemia 02/09/2017   Other psoriasis 12/30/2016   High risk medications (not anticoagulants) long-term use 12/30/2016   Psoriatic arthritis (HCC) 12/27/2014   Vitamin D deficiency 08/06/2013   Osteopenia 08/06/2013   Insomnia 02/22/2013   Hyperlipidemia 01/09/2013   Osteoarthritis of shoulder 09/15/2010    Past Medical History:  Diagnosis Date   Anemia 1986   Cataract Have but not ready to be removed.   Hx of colonic polyps    Hypercholesterolemia    Psoriatic arthritis (HCC)     Family History  Problem Relation Age of Onset   Anesthesia problems Mother    Cancer Mother    Hypertension Mother    Breast cancer Mother    Arthritis Mother    Obesity Mother    Cancer Father    Diabetes Father    Deep vein thrombosis Father    Hypertension  Father    Hypertension Sister    Diabetes Sister    Hearing loss Sister    Obesity Sister    Migraines Sister    Migraines Sister    Hypertension Brother    Heart disease Brother    Cancer Brother    Obesity Brother    Early death Brother    Heart Problems Brother        Leaky valve   Alcohol abuse Maternal Uncle    Arthritis Other        FH   Diabetes Other        FH   Cancer Other        FH   Hypotension Neg Hx    Malignant hyperthermia Neg Hx     Pseudochol deficiency Neg Hx    Past Surgical History:  Procedure Laterality Date   ABDOMINAL HYSTERECTOMY     COLONOSCOPY  08/25/2011   Procedure: COLONOSCOPY;  Surgeon: Malissa HippoNajeeb U Rehman, MD;  [facility];  Service: Endoscopy;  Laterality: N/A;  12:00   COLONOSCOPY N/A 09/21/2018   Procedure: COLONOSCOPY;  Surgeon: Malissa Hippoehman, Najeeb U, MD;  Location[facility];  Service: Endoscopy;  Laterality: N/A;  830   left knee stable out  10/04/1964   left knee staple in  10/04/1960   VESICOVAGINAL FISTULA CLOSURE W/ TAH  10/04/1984   Social History   Social History Narrative   Retired, Airline pilotaccountant.   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   No biological children, but has fostered many.   Several siblings living.    Immunization History  Administered Date(s) Administered   Fluad Quad(high Dose 65+) 10/02/2020, 07/29/2021   Influenza,inj,Quad PF,6+ Mos 06/19/2019   Influenza-Unspecified 07/13/2016, 08/02/2018   Moderna Sars-Covid-2 Vaccination 11/28/2019, 12/26/2019, 08/26/2020, 05/15/2021   PPD Test 11/06/2019   Pneumococcal Conjugate-13 12/27/2014   Pneumococcal Polysaccharide-23 03/30/2016   Pneumococcal-Unspecified 11/06/2009   Zoster Recombinat (Shingrix) 07/07/2021   Zoster, Live 07/02/2016     Objective: Vital Signs: BP 135/74 (BP Location: Left Arm, Patient Position: Sitting, Cuff Size: Normal)   Pulse (!) 48   Resp 13   Ht 5\' 2"  (1.575 m)   Wt 147 lb 3.2 oz (66.8 kg)   BMI 26.92 kg/m    Physical Exam Vitals and nursing note reviewed.  Constitutional:      Appearance: She is well-developed.  HENT:     Head: Normocephalic and atraumatic.  Eyes:     Conjunctiva/sclera: Conjunctivae normal.  Cardiovascular:     Rate and Rhythm: Normal rate and regular rhythm.     Heart sounds: Normal heart sounds.  Pulmonary:     Effort: Pulmonary effort is normal.     Breath sounds: Normal breath sounds.  Abdominal:     General: Bowel sounds are normal.     Palpations: Abdomen is  soft.  Musculoskeletal:     Cervical back: Normal range of motion.  Skin:    General: Skin is warm and dry.     Capillary Refill: Capillary refill takes less than 2 seconds.     Comments: Fingernail pitting noted.  Neurological:     Mental Status: She is alert and oriented to person, place, and time.  Psychiatric:        Behavior: Behavior normal.      Musculoskeletal Exam: C-spine, thoracic spine, and lumbar spine good range of motion.  No midline spinal tenderness or SI joint tenderness.  Shoulder joints, elbow joints, wrist joints, MCPs, PIPs, DIPs have good range of  motion with no synovitis.  Some PIP and DIP prominence consistent with osteoarthritis hands.  Hip joints have good range of motion with no groin pain.  Knee joints have good range of motion with no warmth fusion.  Ankle joint motion have good ROM with no tenderness or joint swelling.  No achilles tendonitis or plantar fasciitis.   CDAI Exam: CDAI Score: -- Patient Global: --; Provider Global: -- Swollen: --; Tender: -- Joint Exam 09/23/2022   No joint exam has been documented for this visit   There is currently no information documented on the homunculus. Go to the Rheumatology activity and complete the homunculus joint exam.  Investigation: No additional findings.  Imaging: No results found.  Recent Labs: Lab Results  Component Value Date   WBC 7.7 03/15/2022   HGB 12.6 03/15/2022   PLT 318 03/15/2022   NA 143 06/02/2022   K 4.2 06/02/2022   CL 107 (H) 06/02/2022   CO2 18 (L) 06/02/2022   GLUCOSE 85 06/02/2022   BUN 11 06/02/2022   CREATININE 1.05 (H) 06/02/2022   BILITOT 0.4 06/02/2022   ALKPHOS 110 06/02/2022   AST 23 06/02/2022   ALT 12 06/02/2022   PROT 6.8 06/02/2022   ALBUMIN 4.5 06/02/2022   CALCIUM 9.8 06/02/2022   GFRAA 73 08/04/2020    Speciality Comments: No specialty comments available.  Procedures:  No procedures performed Allergies: Etodolac    Assessment / Plan:     Visit  Diagnoses: Psoriatic arthritis (HCC): She has no synovitis or dactylitis on examination today.  She has not had any signs or symptoms of a psoriatic arthritis flare.  Overall she has clinically been doing well on Otezla 30 mg 1 tablet by mouth twice daily.  No evidence of Achilles tendinitis or plantar fasciitis.  No evidence of uveitis.  No SI joint tenderness upon palpation.  She has occasional flares of psoriasis on the extensor surface of her elbows which typically resolves with the use of over-the-counter topical agents.  She remains active walking 3 miles 3 days a week and going to the [facility] for a short size twice a week.  Discussed the importance of remaining compliant taking Otezla as prescribed.  She was advised to notify us if she develops signs or symptoms of a flare.  She will follow-up in the office in 5 months or sooner if needed.  Other psoriasis: She experiences intermittent flares of psoriasis on the extensor surface of her elbow joints.  She has no active patches at this time.  She uses an over-the-counter topical agent as needed which typically clears the patches.  She will remain on Otezla as prescribed.  She is advised to notify us if she would like a prescription strength topical agent to use in the future.  High risk medications (not anticoagulants) long-term use - Otezla 30 mg 1 tablet by mouth twice daily. BMP and hepatic function panel were drawn on 06/02/2022 and were reviewed with the patient today in the office.  CBC and CMP were ordered today.  Results will be forwarded to her PCP to review as requested.  No recent or recurrent infections. No new medical conditions.  - Plan: CBC with Differential/Platelet, COMPLETE METABOLIC PANEL WITH GFR  Primary osteoarthritis of left knee - X-rays of the left knee were obtained on 04/11/2019 which were consistent with severe chondromalacia patella and moderate osteoarthritis.  She has good range of motion of the left knee joint on examination  today.  No warmth or effusion noted.  She has been walking 3 miles 3 days a week and exercising at the [facility] twice a week without difficulty.  Osteopenia of multiple sites -  DEXA updated on 07/26/22: The BMD measured at Forearm Radius 33% is 0.573 g/cm2 with a T-score of -2.0.  Discussed the importance of taking a calcium and vitamin D supplement daily.  She was given a handout of information regarding calcium and vitamin D supplementation.  Previous DEXA 07/18/2020 The BMD measured at Forearm Radius 33% is 0.570 g/cm2 with a T-score of -2.0.   Discussed the importance of taking a calcium and vitamin D supplement daily.  She has been taking vitamin D 2000 units daily.  She was given informational handout with calcium rich foods as well as supplement recommendations.  Vitamin D deficiency: She is taking vitamin D 2000 units daily.   Other medical conditions are listed as follows:   Other insomnia  History of hyperlipidemia  History of anemia  Orders: Orders Placed This Encounter  Procedures   CBC with Differential/Platelet   COMPLETE METABOLIC PANEL WITH GFR   No orders of the defined types were placed in this encounter.    Follow-Up Instructions: Return in about 5 months (around 02/22/2023) for Psoriatic arthritis.   Gearldine Bienenstock, PA-C  Note - This record has been created using Dragon software.  Chart creation errors have been sought, but may not always  have been located. Such creation errors do not reflect on  the standard of medical care.

## 2022-09-23 ENCOUNTER — Telehealth: Payer: Self-pay | Admitting: Pharmacist

## 2022-09-23 ENCOUNTER — Ambulatory Visit: Payer: Medicare Other | Attending: Physician Assistant | Admitting: Physician Assistant

## 2022-09-23 ENCOUNTER — Encounter: Payer: Self-pay | Admitting: Physician Assistant

## 2022-09-23 VITALS — BP 135/74 | HR 48 | Resp 13 | Ht 62.0 in | Wt 147.2 lb

## 2022-09-23 DIAGNOSIS — L408 Other psoriasis: Secondary | ICD-10-CM

## 2022-09-23 DIAGNOSIS — Z79899 Other long term (current) drug therapy: Secondary | ICD-10-CM

## 2022-09-23 DIAGNOSIS — Z8639 Personal history of other endocrine, nutritional and metabolic disease: Secondary | ICD-10-CM

## 2022-09-23 DIAGNOSIS — L4050 Arthropathic psoriasis, unspecified: Secondary | ICD-10-CM | POA: Diagnosis not present

## 2022-09-23 DIAGNOSIS — M1712 Unilateral primary osteoarthritis, left knee: Secondary | ICD-10-CM

## 2022-09-23 DIAGNOSIS — E559 Vitamin D deficiency, unspecified: Secondary | ICD-10-CM

## 2022-09-23 DIAGNOSIS — M8589 Other specified disorders of bone density and structure, multiple sites: Secondary | ICD-10-CM

## 2022-09-23 DIAGNOSIS — Z862 Personal history of diseases of the blood and blood-forming organs and certain disorders involving the immune mechanism: Secondary | ICD-10-CM

## 2022-09-23 DIAGNOSIS — G4709 Other insomnia: Secondary | ICD-10-CM

## 2022-09-23 NOTE — Telephone Encounter (Signed)
Patient had OV today and stated she completed her part of application and has mailed in to [employer] directly latter part of last week. Will prepare provider portion. Pending provider sig from Sherron Ales, PA-C  Will need to submit PA renewal as well. Submitted a Prior Authorization RENEWAL request to [facility] for OTEZLA via CoverMyMeds. Will update once we receive a response.  Key: Victorino Dike, PharmD, MPH, BCPS, CPP Clinical Pharmacist (Rheumatology and Pulmonology)

## 2022-09-23 NOTE — Progress Notes (Signed)
Hemoglobin and hematocrit are low. MCV and MCH. Please notify the patient. Please see if iron panel can be added.

## 2022-09-23 NOTE — Telephone Encounter (Signed)
[employer] called to advise patients Otezla 30 mg is Approved from 09/23/22-09/24/23.[employer] has notified patient and pharmacy per [facility] with [employer]. [phone].

## 2022-09-24 LAB — CBC WITH DIFFERENTIAL/PLATELET
Absolute Monocytes: 496 cells/uL (ref 200–950)
Basophils Absolute: 40 cells/uL (ref 0–200)
Basophils Relative: 0.7 %
Eosinophils Absolute: 68 cells/uL (ref 15–500)
Eosinophils Relative: 1.2 %
HCT: 33.7 % — ABNORMAL LOW (ref 35.0–45.0)
Hemoglobin: 10.9 g/dL — ABNORMAL LOW (ref 11.7–15.5)
Lymphs Abs: 2850 cells/uL (ref 850–3900)
MCH: 24.2 pg — ABNORMAL LOW (ref 27.0–33.0)
MCHC: 32.3 g/dL (ref 32.0–36.0)
MCV: 74.7 fL — ABNORMAL LOW (ref 80.0–100.0)
MPV: 12.7 fL — ABNORMAL HIGH (ref 7.5–12.5)
Monocytes Relative: 8.7 %
Neutro Abs: 2246 cells/uL (ref 1500–7800)
Neutrophils Relative %: 39.4 %
Platelets: 188 10*3/uL (ref 140–400)
RBC: 4.51 10*6/uL (ref 3.80–5.10)
RDW: 15 % (ref 11.0–15.0)
Total Lymphocyte: 50 %
WBC: 5.7 10*3/uL (ref 3.8–10.8)

## 2022-09-24 LAB — COMPLETE METABOLIC PANEL WITH GFR
AG Ratio: 1.6 (calc) (ref 1.0–2.5)
ALT: 11 U/L (ref 6–29)
AST: 20 U/L (ref 10–35)
Albumin: 4.2 g/dL (ref 3.6–5.1)
Alkaline phosphatase (APISO): 88 U/L (ref 37–153)
BUN: 15 mg/dL (ref 7–25)
CO2: 28 mmol/L (ref 20–32)
Calcium: 9.7 mg/dL (ref 8.6–10.4)
Chloride: 108 mmol/L (ref 98–110)
Creat: 0.97 mg/dL (ref 0.60–1.00)
Globulin: 2.6 g/dL (calc) (ref 1.9–3.7)
Glucose, Bld: 79 mg/dL (ref 65–99)
Potassium: 4.2 mmol/L (ref 3.5–5.3)
Sodium: 143 mmol/L (ref 135–146)
Total Bilirubin: 0.4 mg/dL (ref 0.2–1.2)
Total Protein: 6.8 g/dL (ref 6.1–8.1)
eGFR: 61 mL/min/{1.73_m2} (ref >=60)

## 2022-09-24 LAB — IRON, TOTAL/TOTAL IRON BINDING CAP
%SAT: 17 % (calc) (ref 16–45)
Iron: 66 ug/dL (ref 45–160)
TIBC: 395 mcg/dL (calc) (ref 250–450)

## 2022-09-24 LAB — TEST AUTHORIZATION

## 2022-09-24 NOTE — Progress Notes (Signed)
CMP WNL

## 2022-09-24 NOTE — Progress Notes (Signed)
Iron panel WNL

## 2022-09-30 ENCOUNTER — Other Ambulatory Visit (HOSPITAL_COMMUNITY): Payer: Self-pay

## 2022-09-30 NOTE — Telephone Encounter (Signed)
Submitted Patient Assistance RENEWAL Application to [employer] for OTEZLA along with provider portion, PA (CMM print out and test claim), med list, and insurance card copy. Will update patient when we receive a response.  Fax# [phone] Phone# [phone]  Patient had stated she mailed in her portion of application already.  Chesley Mires, PharmD, MPH, BCPS, CPP Clinical Pharmacist (Rheumatology and Pulmonology)

## 2022-10-01 ENCOUNTER — Ambulatory Visit
Admission: RE | Admit: 2022-10-01 | Discharge: 2022-10-01 | Disposition: A | Discharge status: Home / Self Care | Payer: Medicare Other | Source: Ambulatory Visit | Attending: Family Medicine | Admitting: Family Medicine

## 2022-10-01 DIAGNOSIS — Z1231 Encounter for screening mammogram for malignant neoplasm of breast: Secondary | ICD-10-CM

## 2022-10-06 ENCOUNTER — Telehealth: Payer: Self-pay | Admitting: *Deleted

## 2022-10-06 ENCOUNTER — Encounter: Payer: Self-pay | Admitting: Family Medicine

## 2022-10-06 DIAGNOSIS — D509 Iron deficiency anemia, unspecified: Secondary | ICD-10-CM

## 2022-10-06 NOTE — Telephone Encounter (Signed)
Nurses I did review over the message that [facility] sent and I reviewed over the labs Typically when MCV is low and MCH is low that can sometimes be a sign of iron deficiency.  Typically if hemoglobin is staying normal which it was on previous lab work this type of finding does not necessarily generate any major interests.  But if hemoglobin does low he needs further evaluation.  What concerns me more is the drop in hemoglobin it was 12.6 now it is 10.9.  Total iron saturation 17% is on the low side. In my opinion I believe she would benefit from further evaluation for iron deficient anemia. I would recommend the following #1-she had a colonoscopy in 2019 so therefore I would recommend stool test for blood- IFBOT  #2-I also recommend a consultation with hematology for further evaluation-I would recommend going ahead with the referral If stool test for blood comes back positive she will need to have further evaluation including probability of a colonoscopy.  #3-has he had any signs of rectal bleeding lately?  Black or tarry stools?  I am certainly available to do a sitdown visit with [name] to discuss this in further detail if she would like thanks

## 2022-10-06 NOTE — Telephone Encounter (Signed)
Patient  contacted the office stating she received a letter from [employer] that her [place] has been approved through them. Patient states she has been getting her [place] from [employer]. Patient advised we always have to get a PA through her insurance to provide to [place]. Patient advised [employer] should be processing her application and provide approval soon.

## 2022-10-07 NOTE — Telephone Encounter (Signed)
Nurses Although it is possible that giving blood can cause hemoglobin go down typically if it rebounds relatively quickly.  Given that she gave blood at the end of October it should have rebounded by now.  This still makes me concerned regarding the lower hemoglobin.  I would recommend the stool test for blood as previously mentioned.  I would also recommend doing CBC, ferritin, TIBC, serum iron because of iron deficient anemia-this blood work could be done in 4 weeks.  In the meantime she should do the best she can and eating healthy and if possible lean meats such as [place] chicken or fish-or if she does not want to do that vegetarian would be fine but with iron tablet take 1 1 perhaps 3 days/week  If the hemoglobin has rebounded no further action will be necessary if the hemoglobin is getting worse I would recommend further workup.  Stool test for blood is necessary.  It is better to be thorough then to take chances thank you  Obviously if [name] feels worse over the next month or progressively having increased fatigue or any other worrisome signs she needs to let us know and we would move her appointment

## 2022-10-13 NOTE — Telephone Encounter (Signed)
Spoke with [employer] rep. OTEZLA PAP was approved from 10/04/2022-10/04/2023. Reaching out to get the approval document faxed to our office. Rep will refax approval letter to clinic.   Maryan Puls, PharmD PGY-1 [facility] Resident

## 2022-10-15 ENCOUNTER — Other Ambulatory Visit: Payer: Self-pay

## 2022-10-15 DIAGNOSIS — D509 Iron deficiency anemia, unspecified: Secondary | ICD-10-CM

## 2022-10-15 LAB — IFOBT (OCCULT BLOOD): IFOBT: NEGATIVE

## 2022-11-11 LAB — CBC WITH DIFFERENTIAL/PLATELET
Basophils Absolute: 0 10*3/uL (ref 0.0–0.2)
Basos: 1 %
EOS (ABSOLUTE): 0 10*3/uL (ref 0.0–0.4)
Eos: 1 %
Hematocrit: 35.2 % (ref 34.0–46.6)
Hemoglobin: 11.8 g/dL (ref 11.1–15.9)
Immature Grans (Abs): 0 10*3/uL (ref 0.0–0.1)
Immature Granulocytes: 0 %
Lymphocytes Absolute: 3.1 10*3/uL (ref 0.7–3.1)
Lymphs: 54 %
MCH: 24.4 pg — ABNORMAL LOW (ref 26.6–33.0)
MCHC: 33.5 g/dL (ref 31.5–35.7)
MCV: 73 fL — ABNORMAL LOW (ref 79–97)
Monocytes Absolute: 0.4 10*3/uL (ref 0.1–0.9)
Monocytes: 8 %
Neutrophils Absolute: 2 10*3/uL (ref 1.4–7.0)
Neutrophils: 36 %
Platelets: 197 10*3/uL (ref 150–450)
RBC: 4.83 x10E6/uL (ref 3.77–5.28)
RDW: 15 % (ref 11.7–15.4)
WBC: 5.6 10*3/uL (ref 3.4–10.8)

## 2022-11-11 LAB — IRON,TIBC AND FERRITIN PANEL
Ferritin: 38 ng/mL (ref 15–150)
Iron Saturation: 23 % (ref 15–55)
Iron: 78 ug/dL (ref 27–139)
Total Iron Binding Capacity: 336 ug/dL (ref 250–450)
UIBC: 258 ug/dL (ref 118–369)

## 2022-12-07 ENCOUNTER — Ambulatory Visit (INDEPENDENT_AMBULATORY_CARE_PROVIDER_SITE_OTHER): Payer: Medicare Other | Admitting: Family Medicine

## 2022-12-07 VITALS — BP 118/75 | HR 67 | Wt 143.0 lb

## 2022-12-07 DIAGNOSIS — L4050 Arthropathic psoriasis, unspecified: Secondary | ICD-10-CM

## 2022-12-07 DIAGNOSIS — D509 Iron deficiency anemia, unspecified: Secondary | ICD-10-CM | POA: Diagnosis not present

## 2022-12-07 DIAGNOSIS — R7989 Other specified abnormal findings of blood chemistry: Secondary | ICD-10-CM

## 2022-12-07 DIAGNOSIS — E785 Hyperlipidemia, unspecified: Secondary | ICD-10-CM | POA: Diagnosis not present

## 2022-12-07 NOTE — Progress Notes (Signed)
   Subjective:    Patient ID: [name], female    DOB: 1946-04-06, 77 y.o.   MRN: [id number]  HPI Patient arrives today for 6 month follow up. Patient states no concerns or issues today. History of hyperlipidemia Also history of anemia And psoriasis    Review of Systems     Objective:   Physical Exam General-in no acute distress Eyes-no discharge Lungs-respiratory rate normal, CTA CV-no murmurs,RRR Extremities skin warm dry no edema Neuro grossly normal Behavior normal, alert        Assessment & Plan:  1. Hyperlipidemia, unspecified hyperlipidemia type Continue rosuvastatin.  Also healthy diet.  Check labs - Basic Metabolic Panel (7) - Lipid panel  2. Iron deficiency anemia, unspecified iron deficiency anemia type Patient interested to see if her hemoglobin is coming up.  Check labs - Hemoglobin and Hematocrit, Blood  3. Elevated serum creatinine History of serum elevated creatinine but on most recent blood work it looks good repeat lab test send check urine micro protein - Basic Metabolic Panel (7) - Microalbumin/Creatinine Ratio, Urine  4. Psoriatic arthritis ([place]) Continue her [place] N[place].  Tylenol as needed

## 2022-12-08 LAB — HEMOGLOBIN AND HEMATOCRIT, BLOOD
Hematocrit: 37 % (ref 34.0–46.6)
Hemoglobin: 12.2 g/dL (ref 11.1–15.9)

## 2022-12-08 LAB — LIPID PANEL
Chol/HDL Ratio: 2.1 ratio (ref 0.0–4.4)
Cholesterol, Total: 188 mg/dL (ref 100–199)
HDL: 90 mg/dL (ref >39)
LDL Chol Calc (NIH): 88 mg/dL (ref 0–99)
Triglycerides: 52 mg/dL (ref 0–149)
VLDL Cholesterol Cal: 10 mg/dL (ref 5–40)

## 2022-12-08 LAB — BASIC METABOLIC PANEL (7)
BUN/Creatinine Ratio: 8 — ABNORMAL LOW (ref 12–28)
BUN: 8 mg/dL (ref 8–27)
CO2: 23 mmol/L (ref 20–29)
Chloride: 102 mmol/L (ref 96–106)
Creatinine, Ser: 0.98 mg/dL (ref 0.57–1.00)
Glucose: 81 mg/dL (ref 70–99)
Potassium: 4.6 mmol/L (ref 3.5–5.2)
Sodium: 141 mmol/L (ref 134–144)
eGFR: 60 mL/min/{1.73_m2} (ref >59)

## 2022-12-08 LAB — MICROALBUMIN / CREATININE URINE RATIO
Creatinine, Urine: 73.7 mg/dL
Microalb/Creat Ratio: <4 mg/g creat (ref 0–29)
Microalbumin, Urine: <3 ug/mL

## 2023-02-08 NOTE — Progress Notes (Deleted)
Office Visit Note  Patient: [name]             Date of Birth: 05/28/1946           MRN: [id number]             PCP: Babs Sciara, MD Referring: Babs Sciara, MD Visit Date: 02/22/2023 Occupation: @GUAROCC @  Subjective:  No chief complaint on file.   History of Present Illness: [name] is a 77 y.o. female ***   DEXA updated on 07/26/22: The BMD measured at Forearm Radius 33% is 0.573 g/cm2 with a T-score of -2.0.  Discussed the importance of taking a calcium and vitamin D supplement daily.  She was given a handout of information regarding calcium and vitamin D supplementation.  Previous DEXA 07/18/2020 The BMD measured at Forearm Radius 33% is 0.570 g/cm2 with a T-score of -2.0.    Activities of Daily Living:  Patient reports morning stiffness for *** {minute/hour:19697}.   Patient {ACTIONS;DENIES/REPORTS:21021675::"Denies"} nocturnal pain.  Difficulty dressing/grooming: {ACTIONS;DENIES/REPORTS:21021675::"Denies"} Difficulty climbing stairs: {ACTIONS;DENIES/REPORTS:21021675::"Denies"} Difficulty getting out of chair: {ACTIONS;DENIES/REPORTS:21021675::"Denies"} Difficulty using hands for taps, buttons, cutlery, and/or writing: {ACTIONS;DENIES/REPORTS:21021675::"Denies"}  No Rheumatology ROS completed.   PMFS History:  Patient Active Problem List   Diagnosis Date Noted   Generalized weakness 03/16/2022   Bilateral high frequency sensorineural hearing loss 06/28/2019   History of hyperlipidemia 02/09/2017   Other psoriasis 12/30/2016   High risk medications (not anticoagulants) long-term use 12/30/2016   Psoriatic arthritis (HCC) 12/27/2014   Vitamin D deficiency 08/06/2013   Osteopenia 08/06/2013   Insomnia 02/22/2013   Hyperlipidemia 01/09/2013   Osteoarthritis of shoulder 09/15/2010    Past Medical History:  Diagnosis Date   Anemia 1986   Cataract Have but not ready to be removed.   Hx of colonic polyps    Hypercholesterolemia     Psoriatic arthritis (HCC)     Family History  Problem Relation Age of Onset   Anesthesia problems Mother    Cancer Mother    Hypertension Mother    Breast cancer Mother    Arthritis Mother    Obesity Mother    Cancer Father    Diabetes Father    Deep vein thrombosis Father    Hypertension Father    Hypertension Sister    Diabetes Sister    Hearing loss Sister    Obesity Sister    Breast cancer Sister    Migraines Sister    Migraines Sister    Alcohol abuse Maternal Uncle    Hypertension Brother    Heart disease Brother    Cancer Brother    Obesity Brother    Early death Brother    Heart Problems Brother        Leaky valve   Arthritis Other        FH   Diabetes Other        FH   Cancer Other        FH   Hypotension Neg Hx    Malignant hyperthermia Neg Hx    Pseudochol deficiency Neg Hx    Past Surgical History:  Procedure Laterality Date   ABDOMINAL HYSTERECTOMY     COLONOSCOPY  08/25/2011   Procedure: COLONOSCOPY;  Surgeon: Malissa Hippo, MD;  [facility];  Service: Endoscopy;  Laterality: N/A;  12:00   COLONOSCOPY N/A 09/21/2018   Procedure: COLONOSCOPY;  Surgeon: Malissa Hippo, MD;  [facility];  Service: Endoscopy;  Laterality: N/A;  830   left knee stable out  10/04/1964   left knee staple in  10/04/1960   VESICOVAGINAL FISTULA CLOSURE W/ TAH  10/04/1984   Social History   Social History Narrative   Retired, Airline pilot.   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   No biological children, but has fostered many.   Several siblings living.    Immunization History  Administered Date(s) Administered   Fluad Quad(high Dose 65+) 10/02/2020, 07/29/2021   Influenza,inj,Quad PF,6+ Mos 06/19/2019   Influenza-Unspecified 07/13/2016, 08/02/2018, 08/25/2022   Moderna Sars-Covid-2 Vaccination 11/28/2019, 12/26/2019, 08/26/2020, 05/15/2021   PPD Test 11/06/2019   Pneumococcal Conjugate-13 12/27/2014   Pneumococcal Polysaccharide-23 03/30/2016    Pneumococcal-Unspecified 11/06/2009   Zoster Recombinat (Shingrix) 07/07/2021, 08/25/2022   Zoster, Live 07/02/2016     Objective: Vital Signs: There were no vitals taken for this visit.   Physical Exam   Musculoskeletal Exam: ***  CDAI Exam: CDAI Score: -- Patient Global: --; Provider Global: -- Swollen: --; Tender: -- Joint Exam 02/22/2023   No joint exam has been documented for this visit   There is currently no information documented on the homunculus. Go to the Rheumatology activity and complete the homunculus joint exam.  Investigation: No additional findings.  Imaging: No results found.  Recent Labs: Lab Results  Component Value Date   WBC 5.6 11/10/2022   HGB 12.2 12/07/2022   PLT 197 11/10/2022   NA 141 12/07/2022   K 4.6 12/07/2022   CL 102 12/07/2022   CO2 23 12/07/2022   GLUCOSE 81 12/07/2022   BUN 8 12/07/2022   CREATININE 0.98 12/07/2022   BILITOT 0.4 09/23/2022   ALKPHOS 110 06/02/2022   AST 20 09/23/2022   ALT 11 09/23/2022   PROT 6.8 09/23/2022   ALBUMIN 4.5 06/02/2022   CALCIUM 9.7 09/23/2022   GFRAA 73 08/04/2020    Speciality Comments: No specialty comments available.  Procedures:  No procedures performed Allergies: Etodolac   Assessment / Plan:     Visit Diagnoses: Psoriatic arthritis (HCC)  Other psoriasis  High risk medications (not anticoagulants) long-term use  Primary osteoarthritis of left knee  Osteopenia of multiple sites  Vitamin D deficiency  Other insomnia  History of hyperlipidemia  History of anemia  Orders: No orders of the defined types were placed in this encounter.  No orders of the defined types were placed in this encounter.   Face-to-face time spent with patient was *** minutes. Greater than 50% of time was spent in counseling and coordination of care.  Follow-Up Instructions: No follow-ups on file.   Gearldine Bienenstock, PA-C  Note - This record has been created using Dragon software.  Chart  creation errors have been sought, but may not always  have been located. Such creation errors do not reflect on  the standard of medical care.

## 2023-02-21 ENCOUNTER — Other Ambulatory Visit: Payer: Self-pay | Admitting: *Deleted

## 2023-02-21 MED ORDER — OTEZLA 30 MG PO TABS: 1.0000 | ORAL_TABLET | Freq: Two times a day (BID) | ORAL | 0 refills | Status: DC

## 2023-02-21 NOTE — Progress Notes (Signed)
Office Visit Note  Patient: [name]             Date of Birth: July 30, 1946           MRN: [id number]             PCP: Emily Sciara, MD Referring: Emily Sciara, MD Visit Date: 03/03/2023 Occupation: @GUAROCC @  Subjective:  Discuss [name] use   History of Present Illness: [name] is a 77 y.o. female with history of psoriatic arthritis and osteoarthritis.  She is taking Otezla 30 mg 1 tablet by mouth twice daily.  She is tolerating Otezla without any side effects.  Patient states that she occasionally misses the evening dose.  She denies any signs or symptoms of a psoriatic arthritis flare.  She denies any SI joint discomfort at this time.  She denies any Achilles tendinitis or plantar fasciitis.  She denies any new patches of psoriasis.  She remains active walking and hiking on a regular basis for exercise.  She states that she hiked about 1 week ago and has had some ongoing stiffness in the left knee which has gradually been improving.  Patient would like to try reducing the dose of Otezla since she has been flare free and would like to minimize long-term risks and medication use if possible. She denies any new medical conditions.  Activities of Daily Living:  Patient reports morning stiffness for a few minutes.   Patient Denies nocturnal pain.  Difficulty dressing/grooming: Denies Difficulty climbing stairs: Denies Difficulty getting out of chair: Denies Difficulty using hands for taps, buttons, cutlery, and/or writing: Denies  Review of Systems  Constitutional:  Negative for fatigue.  HENT:  Negative for mouth sores and mouth dryness.   Eyes:  Negative for dryness.  Respiratory:  Negative for shortness of breath.   Cardiovascular:  Negative for chest pain and palpitations.  Gastrointestinal:  Negative for blood in stool, constipation and diarrhea.  Endocrine: Negative for increased urination.  Genitourinary:  Negative for involuntary urination.   Musculoskeletal:  Positive for morning stiffness. Negative for joint pain, gait problem, joint pain, joint swelling, myalgias, muscle weakness, muscle tenderness and myalgias.  Skin:  Negative for color change, rash, hair loss and sensitivity to sunlight.  Allergic/Immunologic: Negative for susceptible to infections.  Neurological:  Negative for dizziness and headaches.  Hematological:  Negative for swollen glands.  Psychiatric/Behavioral:  Positive for sleep disturbance. Negative for depressed mood. The patient is not nervous/anxious.     PMFS History:  Patient Active Problem List   Diagnosis Date Noted   Generalized weakness 03/16/2022   Bilateral high frequency sensorineural hearing loss 06/28/2019   History of hyperlipidemia 02/09/2017   Other psoriasis 12/30/2016   High risk medications (not anticoagulants) long-term use 12/30/2016   Psoriatic arthritis (HCC) 12/27/2014   Vitamin D deficiency 08/06/2013   Osteopenia 08/06/2013   Insomnia 02/22/2013   Hyperlipidemia 01/09/2013   Osteoarthritis of shoulder 09/15/2010    Past Medical History:  Diagnosis Date   Anemia 1986   Cataract Have but not ready to be removed.   Hx of colonic polyps    Hypercholesterolemia    Psoriatic arthritis (HCC)     Family History  Problem Relation Age of Onset   Anesthesia problems Mother    Cancer Mother    Hypertension Mother    Breast cancer Mother    Arthritis Mother    Obesity Mother    Cancer Father    Diabetes Father  Deep vein thrombosis Father    Hypertension Father    Hypertension Sister    Diabetes Sister    Hearing loss Sister    Obesity Sister    Breast cancer Sister    Migraines Sister    Migraines Sister    Alcohol abuse Maternal Uncle    Hypertension Brother    Heart disease Brother    Cancer Brother    Obesity Brother    Early death Brother    Heart Problems Brother        Leaky valve   Arthritis Other        FH   Diabetes Other        FH   Cancer Other         FH   Hypotension Neg Hx    Malignant hyperthermia Neg Hx    Pseudochol deficiency Neg Hx    Past Surgical History:  Procedure Laterality Date   ABDOMINAL HYSTERECTOMY     COLONOSCOPY  08/25/2011   Procedure: COLONOSCOPY;  Surgeon: Malissa Hippo, MD;  Location: [facility];  Service: Endoscopy;  Laterality: N/A;  12:00   COLONOSCOPY N/A 09/21/2018   Procedure: COLONOSCOPY;  Surgeon: Malissa Hippo, MD;  Location: [facility];  Service: Endoscopy;  Laterality: N/A;  830   left knee stable out  10/04/1964   left knee staple in  10/04/1960   VESICOVAGINAL FISTULA CLOSURE W/ TAH  10/04/1984   Social History   Social History Narrative   Retired, Airline pilot.   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   No biological children, but has fostered many.   Several siblings living.    Immunization History  Administered Date(s) Administered   Fluad Quad(high Dose 65+) 10/02/2020, 07/29/2021   Influenza,inj,Quad PF,6+ Mos 06/19/2019   Influenza-Unspecified 07/13/2016, 08/02/2018, 08/25/2022   Moderna Sars-Covid-2 Vaccination 11/28/2019, 12/26/2019, 08/26/2020, 05/15/2021   PPD Test 11/06/2019   Pneumococcal Conjugate-13 12/27/2014   Pneumococcal Polysaccharide-23 03/30/2016   Pneumococcal-Unspecified 11/06/2009   Zoster Recombinat (Shingrix) 07/07/2021, 08/25/2022   Zoster, Live 07/02/2016     Objective: Vital Signs: BP 115/69 (BP Location: Left Arm, Patient Position: Sitting, Cuff Size: Normal)   Pulse (!) 52   Resp 17   Ht 5\' 2"  (1.575 m)   Wt 142 lb 3.2 oz (64.5 kg)   BMI 26.01 kg/m    Physical Exam Vitals and nursing note reviewed.  Constitutional:      Appearance: She is well-developed.  HENT:     Head: Normocephalic and atraumatic.  Eyes:     Conjunctiva/sclera: Conjunctivae normal.  Cardiovascular:     Rate and Rhythm: Normal rate and regular rhythm.     Heart sounds: Normal heart sounds.  Pulmonary:     Effort: Pulmonary effort is normal.     Breath sounds: Normal  breath sounds.  Abdominal:     General: Bowel sounds are normal.     Palpations: Abdomen is soft.  Musculoskeletal:     Cervical back: Normal range of motion.  Lymphadenopathy:     Cervical: No cervical adenopathy.  Skin:    General: Skin is warm and dry.     Capillary Refill: Capillary refill takes less than 2 seconds.  Neurological:     Mental Status: She is alert and oriented to person, place, and time.  Psychiatric:        Behavior: Behavior normal.      Musculoskeletal Exam: C-spine, thoracic spine, lumbar spine have good range of motion.  No midline spinal tenderness.  No SI joint tenderness.  Shoulder joints, elbow joints, wrist joints, MCPs, PIPs, DIPs have good range of motion with no synovitis.  Mild PIP and DIP prominence consistent with osteoarthritis of both hands.  Hip joints have good range of motion with no groin pain.  Knee joints have good range of motion with some stiffness in the left knee.  Ankle joints have good range of motion with no tenderness or synovitis.  No evidence of Achilles tendinitis or plantar fasciitis.  PIP and DIP thickening consistent with osteoarthritis of both feet.  CDAI Exam: CDAI Score: -- Patient Global: --; Provider Global: -- Swollen: --; Tender: -- Joint Exam 03/03/2023   No joint exam has been documented for this visit   There is currently no information documented on the homunculus. Go to the Rheumatology activity and complete the homunculus joint exam.  Investigation: No additional findings.  Imaging: No results found.  Recent Labs: Lab Results  Component Value Date   WBC 5.6 11/10/2022   HGB 12.2 12/07/2022   PLT 197 11/10/2022   NA 141 12/07/2022   K 4.6 12/07/2022   CL 102 12/07/2022   CO2 23 12/07/2022   GLUCOSE 81 12/07/2022   BUN 8 12/07/2022   CREATININE 0.98 12/07/2022   BILITOT 0.4 09/23/2022   ALKPHOS 110 06/02/2022   AST 20 09/23/2022   ALT 11 09/23/2022   PROT 6.8 09/23/2022   ALBUMIN 4.5 06/02/2022    CALCIUM 9.7 09/23/2022   GFRAA 73 08/04/2020    Speciality Comments: No specialty comments available.  Procedures:  No procedures performed Allergies: Etodolac   Assessment / Plan:     Visit Diagnoses: Psoriatic arthritis (HCC): She has no synovitis or dactylitis on examination today.  No Achilles tendinitis or plantar fasciitis.  No SI joint tenderness upon palpation.  She remains active walking and hiking on a regular basis.  She has been taking Otezla 30 mg 1 tablet by mouth twice daily.  She has been tolerating Otezla without any side effects.  She often misses the evening dose of Otezla but overall has been clinically been doing well.  Patient would like to try reducing the dose of Otezla from twice daily to once daily since she has clinically been doing well.  A refill of [name] was sent to the pharmacy on 02/03/2023.  She was advised to notify us if she develops any new or worsening symptoms on the reduced dose of Otezla.  She will follow-up in the office in 5 months or sooner if needed.  Other psoriasis: Patches of hyperpigmentation noted.  She uses topical agents as needed.  She will notify us if she has a flare of psoriasis as she tries reducing the dose of Otezla from twice daily to once daily.  High risk medications (not anticoagulants) long-term use - Otezla 30 mg 1 tablet by mouth once daily.  Patient plans on trying to reduce the dose of Otezla from twice daily to once daily.  She is concerned about long-term risks of taking [place].  Discussed that there are no new safety signals with [name].   Reviewed BMP from 12/07/2022.  CBC was updated on 11/10/2022.  Primary osteoarthritis of left knee - X-rays of the left knee were obtained on 04/11/2019 which were consistent with severe chondromalacia patella and moderate osteoarthritis.  Patient remains active walking and hiking on a regular basis for exercise.  She hiked about 1 week ago and has had some increase stiffness in the left knee which  has  gradually been improving.  She was given a list of natural anti-inflammatories which she can start taking.  Osteopenia of multiple sites - DEXA on 07/26/22: The BMD measured at Forearm Radius 33% is 0.573 g/cm2 with a T-scoreof -2.0.  Taking vitamin D 2000 units daily.  Due to update DEXA in October 2025.  Vitamin D deficiency: She is taking vitamin D 2000 units daily.  Other medical conditions are listed as follows:  Other insomnia  History of anemia  History of hyperlipidemia: Taking Crestor 5 mg daily.  Orders: No orders of the defined types were placed in this encounter.  No orders of the defined types were placed in this encounter.     Follow-Up Instructions: Return in about 5 months (around 08/03/2023) for Psoriatic arthritis, Osteoarthritis.   Gearldine Bienenstock, PA-C  Note - This record has been created using Dragon software.  Chart creation errors have been sought, but may not always  have been located. Such creation errors do not reflect on  the standard of medical care.

## 2023-02-21 NOTE — Telephone Encounter (Signed)
Patient contacted the office and request a refill on Otezla.   Last Fill: 05/07/2022  Labs: 12/07/2022 BUN/Creat. Ratio 8, 11/10/2022 MCV 73, MCH 24.4  Next Visit: 03/03/2023  Last Visit: 09/23/2022  DX: Psoriatic arthritis   Current Dose per office note 09/23/2022: [name] 30 mg 1 tablet by mouth twice daily   Okay to refill [name]?

## 2023-02-22 ENCOUNTER — Ambulatory Visit: Payer: Medicare Other | Admitting: Physician Assistant

## 2023-02-22 DIAGNOSIS — Z862 Personal history of diseases of the blood and blood-forming organs and certain disorders involving the immune mechanism: Secondary | ICD-10-CM

## 2023-02-22 DIAGNOSIS — E559 Vitamin D deficiency, unspecified: Secondary | ICD-10-CM

## 2023-02-22 DIAGNOSIS — L408 Other psoriasis: Secondary | ICD-10-CM

## 2023-02-22 DIAGNOSIS — Z79899 Other long term (current) drug therapy: Secondary | ICD-10-CM

## 2023-02-22 DIAGNOSIS — G4709 Other insomnia: Secondary | ICD-10-CM

## 2023-02-22 DIAGNOSIS — M1712 Unilateral primary osteoarthritis, left knee: Secondary | ICD-10-CM

## 2023-02-22 DIAGNOSIS — M8589 Other specified disorders of bone density and structure, multiple sites: Secondary | ICD-10-CM

## 2023-02-22 DIAGNOSIS — L4050 Arthropathic psoriasis, unspecified: Secondary | ICD-10-CM

## 2023-02-22 DIAGNOSIS — Z8639 Personal history of other endocrine, nutritional and metabolic disease: Secondary | ICD-10-CM

## 2023-03-03 ENCOUNTER — Ambulatory Visit: Payer: Medicare Other | Attending: Physician Assistant | Admitting: Physician Assistant

## 2023-03-03 ENCOUNTER — Encounter: Payer: Self-pay | Admitting: Physician Assistant

## 2023-03-03 VITALS — BP 115/69 | HR 52 | Resp 17 | Ht 62.0 in | Wt 142.2 lb

## 2023-03-03 DIAGNOSIS — L4050 Arthropathic psoriasis, unspecified: Secondary | ICD-10-CM

## 2023-03-03 DIAGNOSIS — M8589 Other specified disorders of bone density and structure, multiple sites: Secondary | ICD-10-CM

## 2023-03-03 DIAGNOSIS — G4709 Other insomnia: Secondary | ICD-10-CM

## 2023-03-03 DIAGNOSIS — E559 Vitamin D deficiency, unspecified: Secondary | ICD-10-CM

## 2023-03-03 DIAGNOSIS — Z79899 Other long term (current) drug therapy: Secondary | ICD-10-CM | POA: Diagnosis not present

## 2023-03-03 DIAGNOSIS — L408 Other psoriasis: Secondary | ICD-10-CM

## 2023-03-03 DIAGNOSIS — M1712 Unilateral primary osteoarthritis, left knee: Secondary | ICD-10-CM

## 2023-03-03 DIAGNOSIS — Z8639 Personal history of other endocrine, nutritional and metabolic disease: Secondary | ICD-10-CM

## 2023-03-03 DIAGNOSIS — Z862 Personal history of diseases of the blood and blood-forming organs and certain disorders involving the immune mechanism: Secondary | ICD-10-CM

## 2023-03-18 ENCOUNTER — Ambulatory Visit (INDEPENDENT_AMBULATORY_CARE_PROVIDER_SITE_OTHER): Payer: Medicare Other

## 2023-03-18 VITALS — Ht 62.0 in | Wt 142.0 lb

## 2023-03-18 DIAGNOSIS — Z0000 Encounter for general adult medical examination without abnormal findings: Secondary | ICD-10-CM

## 2023-03-18 NOTE — Progress Notes (Signed)
I connected with  [name] on 03/18/23 by a audio enabled telemedicine application and verified that I am speaking with the correct person using two identifiers.  Patient Location: Home  Provider Location: Home Office  I discussed the limitations of evaluation and management by telemedicine. The patient expressed understanding and agreed to proceed.  Patient Medicare AWV questionnaire was completed by the patient on 03/14/2023; I have confirmed that all information answered by patient is correct and no changes since this date.    Due to this visit being a telehealth visit, certain criteria was not obtained, such a blood pressure, CBG if patient is a diabetic, and timed up and go.   Subjective:   [name] is a 77 y.o. female who presents for Medicare Annual (Subsequent) preventive examination.  Review of Systems     Cardiac Risk Factors include: advanced age (>28men, >33 women);dyslipidemia     Objective:    Today's Vitals   03/18/23 1539  Weight: 142 lb (64.4 kg)  Height: 5\' 2"  (1.575 m)   Body mass index is 25.97 kg/m.     03/18/2023    3:44 PM 02/09/2022   10:58 AM 12/17/2021    3:30 PM 02/03/2021   11:07 AM 11/09/2019    8:24 AM 09/21/2018    7:27 AM 06/30/2016   12:47 PM  Advanced Directives  Does Patient Have a Medical Advance Directive? No No No No No No Yes  Type of Advance Directive       Living will;Healthcare Power of Attorney  Does patient want to make changes to medical advance directive?       No - Patient declined  Copy of Healthcare Power of Attorney in Chart?       No - copy requested  Would patient like information on creating a medical advance directive? No - Patient declined No - Patient declined No - Patient declined No - Patient declined No - Patient declined No - Patient declined     Current Medications (verified) Outpatient Encounter Medications as of 03/18/2023  Medication Sig   Apremilast (OTEZLA) 30 MG TABS Take 1 tablet (30  mg total) by mouth 2 (two) times daily.   Cholecalciferol (VITAMIN D3) 50 MCG (2000 UT) TABS Take 2,000 Units by mouth every evening.   rosuvastatin (CRESTOR) 5 MG tablet Take 1 tablet (5 mg total) by mouth daily.   No facility-administered encounter medications on file as of 03/18/2023.    Allergies (verified) Etodolac   History: Past Medical History:  Diagnosis Date   Anemia 1986   Cataract Have but not ready to be removed.   Hx of colonic polyps    Hypercholesterolemia    Psoriatic arthritis (HCC)    Past Surgical History:  Procedure Laterality Date   ABDOMINAL HYSTERECTOMY     COLONOSCOPY  08/25/2011   Procedure: COLONOSCOPY;  Surgeon: Malissa Hippo, MD;  Location: [facility];  Service: Endoscopy;  Laterality: N/A;  12:00   COLONOSCOPY N/A 09/21/2018   Procedure: COLONOSCOPY;  Surgeon: Malissa Hippo, MD;  Location: [facility];  Service: Endoscopy;  Laterality: N/A;  830   left knee stable out  10/04/1964   left knee staple in  10/04/1960   VESICOVAGINAL FISTULA CLOSURE W/ TAH  10/04/1984   Family History  Problem Relation Age of Onset   Anesthesia problems Mother    Cancer Mother    Hypertension Mother    Breast cancer Mother    Arthritis Mother  Obesity Mother    Cancer Father    Diabetes Father    Deep vein thrombosis Father    Hypertension Father    Hypertension Sister    Diabetes Sister    Hearing loss Sister    Obesity Sister    Breast cancer Sister    Migraines Sister    Migraines Sister    Alcohol abuse Maternal Uncle    Hypertension Brother    Heart disease Brother    Cancer Brother    Obesity Brother    Early death Brother    Heart Problems Brother        Leaky valve   Arthritis Other        FH   Diabetes Other        FH   Cancer Other        FH   Hypotension Neg Hx    Malignant hyperthermia Neg Hx    Pseudochol deficiency Neg Hx    Social History   Socioeconomic History   Marital status: Widowed    Spouse name: Not on  file   Number of children: 0   Years of education: Not on file   Highest education level: Not on file  Occupational History   Not on file  Tobacco Use   Smoking status: Never    Passive exposure: Past   Smokeless tobacco: Never   Tobacco comments:    Never smoked.  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   No biological children, but has fostered many.   Several siblings living.    Social Determinants of Health   Financial Resource Strain: Low Risk  (03/18/2023)   Overall Financial Resource Strain (CARDIA)    Difficulty of Paying Living Expenses: Not hard at all  Food Insecurity: No Food Insecurity (03/18/2023)   Hunger Vital Sign    Worried About Running Out of Food in the Last Year: Never true    Ran Out of Food in the Last Year: Never true  Transportation Needs: No Transportation Needs (03/18/2023)   PRAPARE - Administrator, Civil Service (Medical): No    Lack of Transportation (Non-Medical): No  Physical Activity: Sufficiently Active (03/18/2023)   Exercise Vital Sign    Days of Exercise per Week: 5 days    Minutes of Exercise per Session: 60 min  Stress: Patient Declined (03/18/2023)   [employer] of Occupational Health - Occupational Stress Questionnaire    Feeling of Stress : Patient declined  Social Connections: Moderately Integrated (03/18/2023)   Social Connection and Isolation Panel [NHANES]    Frequency of Communication with Friends and Family: More than three times a week    Frequency of Social Gatherings with Friends and Family: Once a week    Attends Religious Services: More than 4 times per year    Active Member of [employer] or Organizations: Yes    Attends Banker Meetings: More than 4  times per year    Marital Status: Widowed    Tobacco Counseling Counseling given: Yes Tobacco comments: Never smoked.   Clinical Intake:  Pre-visit preparation completed: Yes  Pain : No/denies pain     BMI - recorded: 25.97 Nutritional  Status: BMI 25 -29 Overweight Nutritional Risks: None Diabetes: No  How often do you need to have someone help you when you read instructions, pamphlets, or other written materials from your doctor or pharmacy?: 1 - Never  Diabetic?no  Interpreter Needed?: No  Information entered by :: Abby Kiano Terrien   Activities of Daily Living    03/18/2023    3:44 PM 03/14/2023    9:56 AM  In your present state of health, do you have any difficulty performing the following activities:  Hearing? 0 0  Vision? 0 0  Difficulty concentrating or making decisions? 0 0  Walking or climbing stairs? 0 0  Dressing or bathing? 0 0  Doing errands, shopping? 0 0  Preparing Food and eating ? N N  Using the Toilet? N N  In the past six months, have you accidently leaked urine? N N  Do you have problems with loss of bowel control? N N  Managing your Medications? N N  Managing your Finances? N N  Housekeeping or managing your Housekeeping? N N    Patient Care Team: Babs Sciara, MD as PCP - General (Family Medicine)  Indicate any recent Medical Services you may have received from other than [facility] providers in the past year (date may be approximate).     Assessment:   This is a routine wellness examination for Sarena.  Hearing/Vision screen Hearing Screening - Comments:: Patient denies any hearing difficulties.   Vision Screening - Comments:: Wears rx glasses - up to date with routine eye exams with  [facility]  Dietary issues and exercise activities discussed: Current Exercise Habits: Home exercise routine, Type of exercise: walking, Time (Minutes): 30, Frequency (Times/Week): 5, Weekly Exercise (Minutes/Week): 150, Intensity: Mild, Exercise  limited by: None identified   Goals Addressed               This Visit's Progress     Patient Stated (pt-stated)        Patients goal is to get her cholesterol numbers lower       Depression Screen    03/18/2023    3:42 PM 12/07/2022    8:31 AM 06/08/2022    8:42 AM 02/09/2022   10:55 AM 05/18/2021    1:57 PM 02/03/2021   11:09 AM 06/19/2019    3:37 PM  PHQ 2/9 Scores  PHQ - 2 Score 0 0 0 0 0 0 1  PHQ- 9 Score  4         Fall Risk    03/18/2023    3:44 PM 03/14/2023    9:56 AM 12/07/2022    8:31 AM 06/08/2022    8:42 AM 02/09/2022   11:00 AM  Fall Risk   Falls in the past year? 0 0 0 0 0  Number falls in past yr: 0 0 0 0 0  Injury with Fall? 0  0 0 0  Risk for fall due to : No Fall Risks   No Fall Risks No Fall Risks  Follow up Falls prevention discussed   Falls evaluation completed Falls prevention discussed    FALL RISK PREVENTION PERTAINING TO THE HOME:  Any stairs in or around the home? No  If so, are there any without handrails? No  Home free of loose throw rugs in walkways, pet beds, electrical cords, etc? Yes  Adequate lighting in your home to reduce risk of falls? Yes   ASSISTIVE DEVICES UTILIZED TO PREVENT FALLS:  Life alert? No  Use  of a cane, walker or w/c? No  Grab bars in the bathroom? Yes  Shower chair or bench in shower? Yes  Elevated toilet seat or a handicapped toilet? Yes   TIMED UP AND GO:  Was the test performed? No .   Cognitive Function:        03/18/2023    3:45 PM 02/09/2022   11:06 AM  6CIT Screen  What Year? 0 points 0 points  What month? 0 points 0 points  What time? 0 points 0 points  Count back from 20 0 points 0 points  Months in reverse 0 points 0 points  Repeat phrase 0 points 0 points  Total Score 0 points 0 points    Immunizations Immunization History  Administered Date(s) Administered   Fluad Quad(high Dose 65+) 10/02/2020, 07/29/2021   Influenza,inj,Quad PF,6+ Mos 06/19/2019   Influenza-Unspecified 07/13/2016,  08/02/2018, 08/25/2022   Moderna Sars-Covid-2 Vaccination 11/28/2019, 12/26/2019, 08/26/2020, 05/15/2021   PPD Test 11/06/2019   Pneumococcal Conjugate-13 12/27/2014   Pneumococcal Polysaccharide-23 03/30/2016   Pneumococcal-Unspecified 11/06/2009   Zoster Recombinat (Shingrix) 07/07/2021, 08/25/2022   Zoster, Live 07/02/2016    TDAP status: Due, Education has been provided regarding the importance of this vaccine. Advised may receive this vaccine at local pharmacy or Health Dept. Aware to provide a copy of the vaccination record if obtained from local pharmacy or Health Dept. Verbalized acceptance and understanding.  Flu Vaccine status: Up to date  Pneumococcal vaccine status: Up to date  Covid-19 vaccine status: Information provided on how to obtain vaccines.   Qualifies for Shingles Vaccine? Yes   Zostavax completed Yes   Shingrix Completed?: Yes  Screening Tests Health Maintenance  Topic Date Due   DTaP/Tdap/Td (1 - Tdap) Never done   COVID-19 Vaccine (5 - 2023-24 season) 06/04/2022   INFLUENZA VACCINE  05/05/2023   MAMMOGRAM  10/02/2023   Medicare Annual Wellness (AWV)  03/17/2024   Pneumonia Vaccine 52+ Years old  Completed   DEXA SCAN  Completed   Hepatitis C Screening  Completed   Zoster Vaccines- Shingrix  Completed   HPV VACCINES  Aged Out    Health Maintenance  Health Maintenance Due  Topic Date Due   DTaP/Tdap/Td (1 - Tdap) Never done   COVID-19 Vaccine (5 - 2023-24 season) 06/04/2022    Colorectal cancer screening: Type of screening: Colonoscopy. Completed 09/21/2018. Repeat every d/c years  Mammogram status: Completed 10/01/2022. Repeat every year  Bone Density status: Completed 07/26/2022. Results reflect: Bone density results: OSTEOPENIA. Repeat every 2 years.  Lung Cancer Screening: (Low Dose CT Chest recommended if Age 13-80 years, 30 pack-year currently smoking OR have quit w/in 15years.) does not qualify.    Additional Screening:  Hepatitis  C Screening: does qualify; Completed 01/05/2017  Vision Screening: Recommended annual ophthalmology exams for early detection of glaucoma and other disorders of the eye. Is the patient up to date with their annual eye exam?  Yes  Who is the provider or what is the name of the office in which the patient attends annual eye exams? Dr. Dione Booze Dental Screening: Recommended annual dental exams for proper oral hygiene  Community Resource Referral / Chronic Care Management: CRR required this visit?  No   CCM required this visit?  No      Plan:     I have personally reviewed and noted the following in the patient's chart:   Medical and social history Use of alcohol, tobacco or illicit drugs  Current medications and supplements including opioid  prescriptions. Patient is not currently taking opioid prescriptions. Functional ability and status Nutritional status Physical activity Advanced directives List of other physicians Hospitalizations, surgeries, and ER visits in previous 12 months Vitals Screenings to include cognitive, depression, and falls Referrals and appointments  In addition, I have reviewed and discussed with patient certain preventive protocols, quality metrics, and best practice recommendations. A written personalized care plan for preventive services as well as general preventive health recommendations were provided to patient.   Due to this being a telephonic visit, the after visit summary with patients personalized plan was offered to patient via mail or my-chart. Patient would like to access their AVS via my-chart    Jordan Hawks Mackinze Criado, CMA   03/18/2023   Nurse Notes: patient advised where to get vaccines

## 2023-03-18 NOTE — Patient Instructions (Addendum)
[name] , Thank you for taking time to come for your Medicare Wellness Visit. I appreciate your ongoing commitment to your health goals. Please review the following plan we discussed and let me know if I can assist you in the future.   These are the goals we discussed:  Goals       DIET - EAT MORE FRUITS AND VEGETABLES      DIET - REDUCE SUGAR INTAKE      Patient Stated (pt-stated)      Patients goal is to get her cholesterol numbers lower        This is a list of the screening recommended for you and due dates:  Health Maintenance  Topic Date Due   DTaP/Tdap/Td vaccine (1 - Tdap) Never done   COVID-19 Vaccine (5 - 2023-24 season) 06/04/2022   Flu Shot  05/05/2023   Mammogram  10/02/2023   Medicare Annual Wellness Visit  03/17/2024   Pneumonia Vaccine  Completed   DEXA scan (bone density measurement)  Completed   Hepatitis C Screening  Completed   Zoster (Shingles) Vaccine  Completed   HPV Vaccine  Aged Out    Advanced directives: Advance directive discussed with you today. Even though you declined this today, please call our office should you change your mind, and we can give you the proper paperwork for you to fill out. Advance care planning is a way to make decisions about medical care that fits your values in case you are ever unable to make these decisions for yourself.  Information on Advanced Care Planning can be found at [facility] of [place] Advance Health Care Directives Advance Health Care Directives ([email or web address]    Conditions/risks identified: Aim for 30 minutes of exercise or brisk walking, 6-8 glasses of water, and 5 servings of fruits and vegetables each day.  You are due for a Tetanus and Covid booster. You can get these as your preferred pharmacy. Please have them send [place] the documentation so that we can update your chart if you get these done.   Next appointment: Follow up in one year for your annual wellness visit  March 23, 2024 at 3:30pm  telephone visit   Preventive Care 65 Years and Older, Female Preventive care refers to lifestyle choices and visits with your health care provider that can promote health and wellness. What does preventive care include? A yearly physical exam. This is also called an annual well check. Dental exams once or twice a year. Routine eye exams. Ask your health care provider how often you should have your eyes checked. Personal lifestyle choices, including: Daily care of your teeth and gums. Regular physical activity. Eating a healthy diet. Avoiding tobacco and drug use. Limiting alcohol use. Practicing safe sex. Taking low-dose aspirin every day. Taking vitamin and mineral supplements as recommended by your health care provider. What happens during an annual well check? The services and screenings done by your health care provider during your annual well check will depend on your age, overall health, lifestyle risk factors, and family history of disease. Counseling  Your health care provider may ask you questions about your: Alcohol use. Tobacco use. Drug use. Emotional well-being. Home and relationship well-being. Sexual activity. Eating habits. History of falls. Memory and ability to understand (cognition). Work and work Astronomer. Reproductive health. Screening  You may have the following tests or measurements: Height, weight, and BMI. Blood pressure. Lipid and cholesterol levels. These may be checked every 5 years, or more frequently  if you are over 64 years old. Skin check. Lung cancer screening. You may have this screening every year starting at age 38 if you have a 30-pack-year history of smoking and currently smoke or have quit within the past 15 years. Fecal occult blood test (FOBT) of the stool. You may have this test every year starting at age 43. Flexible sigmoidoscopy or colonoscopy. You may have a sigmoidoscopy every 5 years or a colonoscopy every 10 years starting  at age 40. Hepatitis C blood test. Hepatitis B blood test. Sexually transmitted disease (STD) testing. Diabetes screening. This is done by checking your blood sugar (glucose) after you have not eaten for a while (fasting). You may have this done every 1-3 years. Bone density scan. This is done to screen for osteoporosis. You may have this done starting at age 16. Mammogram. This may be done every 1-2 years. Talk to your health care provider about how often you should have regular mammograms. Talk with your health care provider about your test results, treatment options, and if necessary, the need for more tests. Vaccines  Your health care provider may recommend certain vaccines, such as: Influenza vaccine. This is recommended every year. Tetanus, diphtheria, and acellular pertussis (Tdap, Td) vaccine. You may need a Td booster every 10 years. Zoster vaccine. You may need this after age 70. Pneumococcal 13-valent conjugate (PCV13) vaccine. One dose is recommended after age 35. Pneumococcal polysaccharide (PPSV23) vaccine. One dose is recommended after age 35. Talk to your health care provider about which screenings and vaccines you need and how often you need them. This information is not intended to replace advice given to you by your health care provider. Make sure you discuss any questions you have with your health care provider. Document Released: 10/17/2015 Document Revised: 06/09/2016 Document Reviewed: 07/22/2015 Elsevier Interactive Patient Education  2017 [employer].  Fall Prevention in the Home Falls can cause injuries. They can happen to people of all ages. There are many things you can do to make your home safe and to help prevent falls. What can I do on the outside of my home? Regularly fix the edges of walkways and driveways and fix any cracks. Remove anything that might make you trip as you walk through a door, such as a raised step or threshold. Trim any bushes or trees on  the path to your home. Use bright outdoor lighting. Clear any walking paths of anything that might make someone trip, such as rocks or tools. Regularly check to see if handrails are loose or broken. Make sure that both sides of any steps have handrails. Any raised decks and porches should have guardrails on the edges. Have any leaves, snow, or ice cleared regularly. Use sand or salt on walking paths during winter. Clean up any spills in your garage right away. This includes oil or grease spills. What can I do in the bathroom? Use night lights. Install grab bars by the toilet and in the tub and shower. Do not use towel bars as grab bars. Use non-skid mats or decals in the tub or shower. If you need to sit down in the shower, use a plastic, non-slip stool. Keep the floor dry. Clean up any water that spills on the floor as soon as it happens. Remove soap buildup in the tub or shower regularly. Attach bath mats securely with double-sided non-slip rug tape. Do not have throw rugs and other things on the floor that can make you trip. What can I  do in the bedroom? Use night lights. Make sure that you have a light by your bed that is easy to reach. Do not use any sheets or blankets that are too big for your bed. They should not hang down onto the floor. Have a firm chair that has side arms. You can use this for support while you get dressed. Do not have throw rugs and other things on the floor that can make you trip. What can I do in the kitchen? Clean up any spills right away. Avoid walking on wet floors. Keep items that you use a lot in easy-to-reach places. If you need to reach something above you, use a strong step stool that has a grab bar. Keep electrical cords out of the way. Do not use floor polish or wax that makes floors slippery. If you must use wax, use non-skid floor wax. Do not have throw rugs and other things on the floor that can make you trip. What can I do with my stairs? Do  not leave any items on the stairs. Make sure that there are handrails on both sides of the stairs and use them. Fix handrails that are broken or loose. Make sure that handrails are as long as the stairways. Check any carpeting to make sure that it is firmly attached to the stairs. Fix any carpet that is loose or worn. Avoid having throw rugs at the top or bottom of the stairs. If you do have throw rugs, attach them to the floor with carpet tape. Make sure that you have a light switch at the top of the stairs and the bottom of the stairs. If you do not have them, ask someone to add them for you. What else can I do to help prevent falls? Wear shoes that: Do not have high heels. Have rubber bottoms. Are comfortable and fit you well. Are closed at the toe. Do not wear sandals. If you use a stepladder: Make sure that it is fully opened. Do not climb a closed stepladder. Make sure that both sides of the stepladder are locked into place. Ask someone to hold it for you, if possible. Clearly mark and make sure that you can see: Any grab bars or handrails. First and last steps. Where the edge of each step is. Use tools that help you move around (mobility aids) if they are needed. These include: Canes. Walkers. Scooters. Crutches. Turn on the lights when you go into a dark area. Replace any light bulbs as soon as they burn out. Set up your furniture so you have a clear path. Avoid moving your furniture around. If any of your floors are uneven, fix them. If there are any pets around you, be aware of where they are. Review your medicines with your doctor. Some medicines can make you feel dizzy. This can increase your chance of falling. Ask your doctor what other things that you can do to help prevent falls. This information is not intended to replace advice given to you by your health care provider. Make sure you discuss any questions you have with your health care provider. Document Released:  07/17/2009 Document Revised: 02/26/2016 Document Reviewed: 10/25/2014 Elsevier Interactive Patient Education  2017 [employer].

## 2023-06-09 ENCOUNTER — Ambulatory Visit: Payer: Medicare Other | Admitting: Family Medicine

## 2023-06-09 ENCOUNTER — Ambulatory Visit: Payer: Medicare Other | Admitting: Nurse Practitioner

## 2023-06-14 ENCOUNTER — Ambulatory Visit (INDEPENDENT_AMBULATORY_CARE_PROVIDER_SITE_OTHER): Payer: Medicare Other | Admitting: Family Medicine

## 2023-06-14 ENCOUNTER — Encounter: Payer: Self-pay | Admitting: Family Medicine

## 2023-06-14 VITALS — BP 119/68 | HR 60 | Temp 98.3°F | Ht 62.0 in | Wt 145.0 lb

## 2023-06-14 DIAGNOSIS — Z131 Encounter for screening for diabetes mellitus: Secondary | ICD-10-CM

## 2023-06-14 DIAGNOSIS — Z79899 Other long term (current) drug therapy: Secondary | ICD-10-CM

## 2023-06-14 DIAGNOSIS — E785 Hyperlipidemia, unspecified: Secondary | ICD-10-CM | POA: Diagnosis not present

## 2023-06-14 DIAGNOSIS — L4050 Arthropathic psoriasis, unspecified: Secondary | ICD-10-CM | POA: Diagnosis not present

## 2023-06-14 MED ORDER — ROSUVASTATIN CALCIUM 5 MG PO TABS: 5.0000 mg | ORAL_TABLET | Freq: Every day | ORAL | 3 refills | Status: DC

## 2023-06-14 NOTE — Progress Notes (Signed)
   Subjective:    Patient ID: [name], female    DOB: 13-Jan-1946, 77 y.o.   MRN: [id number]  Hyperlipidemia This is a chronic problem. Treatments tried: crestor.  Refills needed  Patient here for follow-up regarding cholesterol.    Patient relates taking medication on a regular basis Denies problems with medication Importance of dietary measures discussed Regular lab work regarding lipid and liver was checked and if needing additional labs was appropriately ordered   Right sided chest pulled muscle last week - worse when exerts She relates she was doing a high intensity exercise felt a pull in her chest muscle.  This happened 1 additional time.  She has been doing interval training  She is taking [name] currently it seems to be helping her psoriasis and her arthritis but not necessarily doing a fantastic job but doing an adequate job in the.  She does see her specialist on a regular basis. Review of Systems     Objective:   Physical Exam General-in no acute distress Eyes-no discharge Lungs-respiratory rate normal, CTA CV-no murmurs,RRR Extremities skin warm dry no edema Neuro grossly normal Behavior normal, alert        Assessment & Plan:   Patient with pulm muscle in the chest related to her exercise we did talk about safe approaches with exercise in regards to intensity and heart rate  Hyperlipidemia she is doing a good job taking her medicine she is going to work really hard on eating healthy for the next several months and then check her lab work later in the fall time we will send her a new narrative on the results  Refills were sent in  We did discuss immunizations she defers she states she will do these later in the year

## 2023-06-14 NOTE — Addendum Note (Signed)
Addended byTrinidad Curet on: 06/14/2023 12:04 PM   Modules accepted: Orders

## 2023-06-23 ENCOUNTER — Other Ambulatory Visit: Payer: Self-pay

## 2023-06-23 MED ORDER — ROSUVASTATIN CALCIUM 5 MG PO TABS: 5.0000 mg | ORAL_TABLET | Freq: Every day | ORAL | 3 refills | Status: DC

## 2023-07-13 ENCOUNTER — Other Ambulatory Visit: Payer: Self-pay | Admitting: *Deleted

## 2023-07-13 MED ORDER — OTEZLA 30 MG PO TABS: 1.0000 | ORAL_TABLET | Freq: Two times a day (BID) | ORAL | 0 refills | Status: DC

## 2023-07-13 NOTE — Telephone Encounter (Signed)
Last Fill: 02/21/2023  Labs: 12/07/2022 BUN/Creat. Ratio 8, 11/10/2022 MCV 73, MCH 24.4  Next Visit: 08/04/2023  Last Visit: 03/03/2023  DX: Psoriatic arthritis   Current Dose per office note 03/03/2023:  Otezla 30 mg 1 tablet by mouth once daily.   Okay to refill [name]?

## 2023-07-13 NOTE — Telephone Encounter (Signed)
Patient contacted the office to request a medication refill.   1. Name of Medication: Otezla  2. How are you currently taking this medication (dosage and times per day)? 1 tablet po BID   3. What pharmacy would you like for that to be sent to? [facility]

## 2023-07-21 NOTE — Progress Notes (Deleted)
Office Visit Note  Patient: [name]             Date of Birth: 20-Jan-1946           MRN: [id number]             PCP: Babs Sciara, MD Referring: Babs Sciara, MD Visit Date: 08/04/2023 Occupation: @GUAROCC @  Subjective:  No chief complaint on file.   History of Present Illness: [name] is a 77 y.o. female ***     Activities of Daily Living:  Patient reports morning stiffness for *** {minute/hour:19697}.   Patient {ACTIONS;DENIES/REPORTS:21021675::"Denies"} nocturnal pain.  Difficulty dressing/grooming: {ACTIONS;DENIES/REPORTS:21021675::"Denies"} Difficulty climbing stairs: {ACTIONS;DENIES/REPORTS:21021675::"Denies"} Difficulty getting out of chair: {ACTIONS;DENIES/REPORTS:21021675::"Denies"} Difficulty using hands for taps, buttons, cutlery, and/or writing: {ACTIONS;DENIES/REPORTS:21021675::"Denies"}  No Rheumatology ROS completed.   PMFS History:  Patient Active Problem List   Diagnosis Date Noted   Generalized weakness 03/16/2022   Bilateral high frequency sensorineural hearing loss 06/28/2019   History of hyperlipidemia 02/09/2017   Other psoriasis 12/30/2016   High risk medications (not anticoagulants) long-term use 12/30/2016   Psoriatic arthritis (HCC) 12/27/2014   Vitamin D deficiency 08/06/2013   Osteopenia 08/06/2013   Insomnia 02/22/2013   Hyperlipidemia 01/09/2013   Osteoarthritis of shoulder 09/15/2010    Past Medical History:  Diagnosis Date   Anemia 1986   Cataract Have but not ready to be removed.   Hx of colonic polyps    Hypercholesterolemia    Psoriatic arthritis (HCC)     Family History  Problem Relation Age of Onset   Anesthesia problems Mother    Cancer Mother    Hypertension Mother    Breast cancer Mother    Arthritis Mother    Obesity Mother    Cancer Father    Diabetes Father    Deep vein thrombosis Father    Hypertension Father    Hypertension Sister    Diabetes Sister    Hearing loss Sister     Obesity Sister    Breast cancer Sister    Migraines Sister    Migraines Sister    Alcohol abuse Maternal Uncle    Hypertension Brother    Heart disease Brother    Cancer Brother    Obesity Brother    Early death Brother    Heart Problems Brother        Leaky valve   Arthritis Other        FH   Diabetes Other        FH   Cancer Other        FH   Hypotension Neg Hx    Malignant hyperthermia Neg Hx    Pseudochol deficiency Neg Hx    Past Surgical History:  Procedure Laterality Date   ABDOMINAL HYSTERECTOMY     COLONOSCOPY  08/25/2011   Procedure: COLONOSCOPY;  Surgeon: Malissa Hippo, MD;  [facility];  Service: Endoscopy;  Laterality: N/A;  12:00   COLONOSCOPY N/A 09/21/2018   Procedure: COLONOSCOPY;  Surgeon: Malissa Hippo, MD;  [facility];  Service: Endoscopy;  Laterality: N/A;  830   left knee stable out  10/04/1964   left knee staple in  10/04/1960   VESICOVAGINAL FISTULA CLOSURE W/ TAH  10/04/1984   Social History   Social History Narrative   Retired, Airline pilot.   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   No biological children, but has fostered many.   Several siblings living.    Immunization History  Administered  Date(s) Administered   Fluad Quad(high Dose 65+) 10/02/2020, 07/29/2021   Influenza,inj,Quad PF,6+ Mos 06/19/2019   Influenza-Unspecified 07/13/2016, 08/02/2018, 08/25/2022   Moderna Sars-Covid-2 Vaccination 11/28/2019, 12/26/2019, 08/26/2020, 05/15/2021   PPD Test 11/06/2019   Pneumococcal Conjugate-13 12/27/2014   Pneumococcal Polysaccharide-23 03/30/2016   Pneumococcal-Unspecified 11/06/2009   Zoster Recombinant(Shingrix) 07/07/2021, 08/25/2022   Zoster, Live 07/02/2016     Objective: Vital Signs: There were no vitals taken for this visit.   Physical Exam   Musculoskeletal Exam: ***  CDAI Exam: CDAI Score: -- Patient Global: --; Provider Global: -- Swollen: --; Tender: -- Joint Exam 08/04/2023   No joint exam has been  documented for this visit   There is currently no information documented on the homunculus. Go to the Rheumatology activity and complete the homunculus joint exam.  Investigation: No additional findings.  Imaging: No results found.  Recent Labs: Lab Results  Component Value Date   WBC 5.6 11/10/2022   HGB 12.2 12/07/2022   PLT 197 11/10/2022   NA 141 12/07/2022   K 4.6 12/07/2022   CL 102 12/07/2022   CO2 23 12/07/2022   GLUCOSE 81 12/07/2022   BUN 8 12/07/2022   CREATININE 0.98 12/07/2022   BILITOT 0.4 09/23/2022   ALKPHOS 110 06/02/2022   AST 20 09/23/2022   ALT 11 09/23/2022   PROT 6.8 09/23/2022   ALBUMIN 4.5 06/02/2022   CALCIUM 9.7 09/23/2022   GFRAA 73 08/04/2020    Speciality Comments: No specialty comments available.  Procedures:  No procedures performed Allergies: Etodolac   Assessment / Plan:     Visit Diagnoses: Psoriatic arthritis (HCC)  Other psoriasis  High risk medications (not anticoagulants) long-term use  Primary osteoarthritis of left knee  Osteopenia of multiple sites  Vitamin D deficiency  Other insomnia  History of anemia  History of hyperlipidemia  Orders: No orders of the defined types were placed in this encounter.  No orders of the defined types were placed in this encounter.   Face-to-face time spent with patient was *** minutes. Greater than 50% of time was spent in counseling and coordination of care.  Follow-Up Instructions: No follow-ups on file.   Gearldine Bienenstock, PA-C  Note - This record has been created using Dragon software.  Chart creation errors have been sought, but may not always  have been located. Such creation errors do not reflect on  the standard of medical care.

## 2023-07-29 NOTE — Progress Notes (Unsigned)
Office Visit Note  Patient: [name]             Date of Birth: 04/12/1946           MRN: [id number]             PCP: Babs Sciara, MD Referring: Babs Sciara, MD Visit Date: 08/11/2023 Occupation: @GUAROCC @  Subjective:  Medication monitoring   History of Present Illness: [name] is a 77 y.o. female with history of psoriatic arthritis and osteoarthritis.  Patient is taking otezla 30 mg 1 tablet by mouth twice daily.  She is tolerating Otezla without any side effects.  States that she tried reducing Otezla to once daily but developed a flare of psoriasis.  She continues to have small patches of psoriasis on her right elbow and anterior aspect of her right knee.  Patient states that the psoriasis resolves with the use of topical agents.  Patient denies any Achilles tendinitis or plantar fasciitis.  She denies any SI joint pain.  Patient continues to exercise on a regular basis.  She denies any recent falls or fractures.  She is taking vitamin D 2000 units daily.   Activities of Daily Living:  Patient reports morning stiffness for 0  none .   Patient Denies nocturnal pain.  Difficulty dressing/grooming: Denies Difficulty climbing stairs: Denies Difficulty getting out of chair: Denies Difficulty using hands for taps, buttons, cutlery, and/or writing: Denies  Review of Systems  Constitutional:  Negative for fatigue.  HENT:  Negative for mouth sores and mouth dryness.   Eyes:  Negative for dryness.  Respiratory:  Negative for shortness of breath.   Cardiovascular:  Negative for chest pain and palpitations.  Gastrointestinal:  Negative for blood in stool, constipation and diarrhea.  Endocrine: Negative for increased urination.  Genitourinary:  Negative for involuntary urination.  Musculoskeletal:  Negative for joint pain, gait problem, joint pain, joint swelling, myalgias, muscle weakness, morning stiffness, muscle tenderness and myalgias.  Skin:  Negative  for color change, rash, hair loss and sensitivity to sunlight.  Allergic/Immunologic: Negative for susceptible to infections.  Neurological:  Negative for dizziness and headaches.  Hematological:  Negative for swollen glands.  Psychiatric/Behavioral:  Positive for sleep disturbance. Negative for depressed mood. The patient is not nervous/anxious.     PMFS History:  Patient Active Problem List   Diagnosis Date Noted   Generalized weakness 03/16/2022   Bilateral high frequency sensorineural hearing loss 06/28/2019   History of hyperlipidemia 02/09/2017   Other psoriasis 12/30/2016   High risk medications (not anticoagulants) long-term use 12/30/2016   Psoriatic arthritis (HCC) 12/27/2014   Vitamin D deficiency 08/06/2013   Osteopenia 08/06/2013   Insomnia 02/22/2013   Hyperlipidemia 01/09/2013   Osteoarthritis of shoulder 09/15/2010    Past Medical History:  Diagnosis Date   Anemia 1986   Cataract Have but not ready to be removed.   Hx of colonic polyps    Hypercholesterolemia    Psoriatic arthritis (HCC)     Family History  Problem Relation Age of Onset   Anesthesia problems Mother    Cancer Mother    Hypertension Mother    Breast cancer Mother    Arthritis Mother    Obesity Mother    Cancer Father    Diabetes Father    Deep vein thrombosis Father    Hypertension Father    Hypertension Sister    Diabetes Sister    Hearing loss Sister    Obesity Sister  Breast cancer Sister    Migraines Sister    Migraines Sister    Alcohol abuse Maternal Uncle    Hypertension Brother    Heart disease Brother    Cancer Brother    Obesity Brother    Early death Brother    Heart Problems Brother        Leaky valve   Arthritis Other        FH   Diabetes Other        FH   Cancer Other        FH   Hypotension Neg Hx    Malignant hyperthermia Neg Hx    Pseudochol deficiency Neg Hx    Past Surgical History:  Procedure Laterality Date   ABDOMINAL HYSTERECTOMY      COLONOSCOPY  08/25/2011   Procedure: COLONOSCOPY;  Surgeon: Malissa Hippo, MD;  [facility];  Service: Endoscopy;  Laterality: N/A;  12:00   COLONOSCOPY N/A 09/21/2018   Procedure: COLONOSCOPY;  Surgeon: Malissa Hippo, MD;  [facility];  Service: Endoscopy;  Laterality: N/A;  830   left knee stable out  10/04/1964   left knee staple in  10/04/1960   VESICOVAGINAL FISTULA CLOSURE W/ TAH  10/04/1984   Social History   Social History Narrative   Retired, Airline pilot.   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   No biological children, but has fostered many.   Several siblings living.    Immunization History  Administered Date(s) Administered   Fluad Quad(high Dose 65+) 10/02/2020, 07/29/2021   Influenza,inj,Quad PF,6+ Mos 06/19/2019   Influenza-Unspecified 07/13/2016, 08/02/2018, 08/25/2022   Moderna Sars-Covid-2 Vaccination 11/28/2019, 12/26/2019, 08/26/2020, 05/15/2021   PPD Test 11/06/2019   Pneumococcal Conjugate-13 12/27/2014   Pneumococcal Polysaccharide-23 03/30/2016   Pneumococcal-Unspecified 11/06/2009   Zoster Recombinant(Shingrix) 07/07/2021, 08/25/2022   Zoster, Live 07/02/2016     Objective: Vital Signs: BP 118/65 (BP Location: Left Arm, Patient Position: Sitting, Cuff Size: Normal)   Pulse (!) 56   Resp 16   Ht 5\' 2"  (1.575 m)   Wt 137 lb (62.1 kg)   BMI 25.06 kg/m    Physical Exam Vitals and nursing note reviewed.  Constitutional:      Appearance: She is well-developed.  HENT:     Head: Normocephalic and atraumatic.  Eyes:     Conjunctiva/sclera: Conjunctivae normal.  Cardiovascular:     Rate and Rhythm: Normal rate and regular rhythm.     Heart sounds: Normal heart sounds.  Pulmonary:     Effort: Pulmonary effort is normal.     Breath sounds: Normal breath sounds.  Abdominal:     General: Bowel sounds are normal.     Palpations: Abdomen is soft.  Musculoskeletal:     Cervical back: Normal range of motion.  Lymphadenopathy:      Cervical: No cervical adenopathy.  Skin:    General: Skin is warm and dry.     Capillary Refill: Capillary refill takes less than 2 seconds.     Comments: Psoriasis noted on the anterior aspect of the right knee and extensor surface of the right elbow.   Fingernail pitting noted.  Neurological:     Mental Status: She is alert and oriented to person, place, and time.  Psychiatric:        Behavior: Behavior normal.      Musculoskeletal Exam: C-spine, thoracic spine, lumbar spine have good range of motion.  No midline spinal tenderness.  No SI joint tenderness.  Shoulder joints, elbow  joints, wrist joints, MCPs, PIPs, DIPs have good range of motion with no synovitis.  Complete fist formation bilaterally.  PIP and DIP thickening noted.  Hip joints have slightly limited range of motion.  Warmth of the left knee noted.  Ankle joints have good range of motion with no tenderness or joint swelling.  No evidence of Achilles tendinitis or plantar fasciitis.  No tenderness over MTP joints.  CDAI Exam: CDAI Score: -- Patient Global: --; Provider Global: -- Swollen: --; Tender: -- Joint Exam 08/11/2023   No joint exam has been documented for this visit   There is currently no information documented on the homunculus. Go to the Rheumatology activity and complete the homunculus joint exam.  Investigation: No additional findings.  Imaging: No results found.  Recent Labs: Lab Results  Component Value Date   WBC 5.6 11/10/2022   HGB 12.2 12/07/2022   PLT 197 11/10/2022   NA 141 12/07/2022   K 4.6 12/07/2022   CL 102 12/07/2022   CO2 23 12/07/2022   GLUCOSE 81 12/07/2022   BUN 8 12/07/2022   CREATININE 0.98 12/07/2022   BILITOT 0.4 09/23/2022   ALKPHOS 110 06/02/2022   AST 20 09/23/2022   ALT 11 09/23/2022   PROT 6.8 09/23/2022   ALBUMIN 4.5 06/02/2022   CALCIUM 9.7 09/23/2022   GFRAA 73 08/04/2020    Speciality Comments: No specialty comments available.  Procedures:  No  procedures performed Allergies: Etodolac   Assessment / Plan:     Visit Diagnoses: Psoriatic arthritis (HCC): Warmth of the left knee noted on examination today.  Patient continues to have active psoriasis on the extensor surface of her right elbow and anterior aspect of the right knee.  She is taking Otezla 30 mg 1 tablet by mouth twice daily.  She continues to tolerate [place] without any side effects.  Patient tried reducing Otezla to once daily for several days after her last visit but had a flare of psoriasis and resumed taking Otezla twice daily. Discussed that she may benefit from switching to [place] in the future.  Reviewed indications, contraindications, and potential side effects today.  She was given informational handout about Cristy Folks to review.  She will notify us when and if she is ready to proceed with [place] in the future.  For now she would like to remain on [place] as prescribed.  She will follow-up in the office in 5 months or sooner if needed.  Other psoriasis: Patient has a few small patches of psoriasis on extensor surface of her right elbow and anterior aspect of the right knee.  Patient tried reducing Otezla from 30 mg 1 tablet twice daily to 1 tablet once daily but had a flare of psoriasis and has resumed taking Otezla twice daily.  Her psoriasis has resolved with the use of topical agents in the past.  She declined a prescription strength topical agent today.   Discussed that in the future she may benefit from switching to [place].  She was given a handout of information about Cristy Folks to review.  High risk medications (not anticoagulants) long-term use - Otezla 30 mg 1 tablet by mouth once daily.  Orders for CBC and CMP released today.  - Plan: CBC with Differential/Platelet, Hepatic function panel, BASIC METABOLIC PANEL WITH GFR  Primary osteoarthritis of left knee: Warmth of the left knee noted.  No effusion noted today.  Osteopenia of multiple sites - DEXA on 07/26/22:  The BMD measured at Forearm Radius 33% is 0.573 g/cm2  with a T-scoreof -2.0.  No recent falls or fractures.  She remains active.  Taking vitamin D 2000 units daily.  Patient was given a handout about calcium and vitamin D supplementation.  A list of calcium rich foods was provided.  Plan to update DEXA in October 2025 for further evaluation.  Vitamin D deficiency: She is taking vitamin D 2000 units daily.  Other medical conditions are listed as follows:  Other insomnia  History of anemia -CBC updated today.  Plan: CBC with Differential/Platelet  History of hyperlipidemia -Lipid panel updated today as requested.  Results will be forwarded to her PCP.  Plan: Lipid panel  Orders: Orders Placed This Encounter  Procedures   CBC with Differential/Platelet   Lipid panel   Hepatic function panel   BASIC METABOLIC PANEL WITH GFR   No orders of the defined types were placed in this encounter.   Follow-Up Instructions: Return in about 5 months (around 01/09/2024) for Psoriatic arthritis.   Gearldine Bienenstock, PA-C  Note - This record has been created using Dragon software.  Chart creation errors have been sought, but may not always  have been located. Such creation errors do not reflect on  the standard of medical care.

## 2023-08-04 ENCOUNTER — Ambulatory Visit: Payer: Medicare Other | Admitting: Physician Assistant

## 2023-08-04 DIAGNOSIS — E559 Vitamin D deficiency, unspecified: Secondary | ICD-10-CM

## 2023-08-04 DIAGNOSIS — Z862 Personal history of diseases of the blood and blood-forming organs and certain disorders involving the immune mechanism: Secondary | ICD-10-CM

## 2023-08-04 DIAGNOSIS — Z79899 Other long term (current) drug therapy: Secondary | ICD-10-CM

## 2023-08-04 DIAGNOSIS — L4050 Arthropathic psoriasis, unspecified: Secondary | ICD-10-CM

## 2023-08-04 DIAGNOSIS — L408 Other psoriasis: Secondary | ICD-10-CM

## 2023-08-04 DIAGNOSIS — M8589 Other specified disorders of bone density and structure, multiple sites: Secondary | ICD-10-CM

## 2023-08-04 DIAGNOSIS — Z8639 Personal history of other endocrine, nutritional and metabolic disease: Secondary | ICD-10-CM

## 2023-08-04 DIAGNOSIS — G4709 Other insomnia: Secondary | ICD-10-CM

## 2023-08-04 DIAGNOSIS — M1712 Unilateral primary osteoarthritis, left knee: Secondary | ICD-10-CM

## 2023-08-11 ENCOUNTER — Ambulatory Visit: Payer: Medicare Other | Attending: Physician Assistant | Admitting: Physician Assistant

## 2023-08-11 ENCOUNTER — Encounter: Payer: Self-pay | Admitting: Physician Assistant

## 2023-08-11 ENCOUNTER — Telehealth: Payer: Self-pay | Admitting: Pharmacist

## 2023-08-11 VITALS — BP 118/65 | HR 56 | Resp 16 | Ht 62.0 in | Wt 137.0 lb

## 2023-08-11 DIAGNOSIS — L4050 Arthropathic psoriasis, unspecified: Secondary | ICD-10-CM | POA: Diagnosis not present

## 2023-08-11 DIAGNOSIS — M1712 Unilateral primary osteoarthritis, left knee: Secondary | ICD-10-CM | POA: Diagnosis not present

## 2023-08-11 DIAGNOSIS — G4709 Other insomnia: Secondary | ICD-10-CM

## 2023-08-11 DIAGNOSIS — M8589 Other specified disorders of bone density and structure, multiple sites: Secondary | ICD-10-CM

## 2023-08-11 DIAGNOSIS — E559 Vitamin D deficiency, unspecified: Secondary | ICD-10-CM

## 2023-08-11 DIAGNOSIS — L408 Other psoriasis: Secondary | ICD-10-CM

## 2023-08-11 DIAGNOSIS — Z862 Personal history of diseases of the blood and blood-forming organs and certain disorders involving the immune mechanism: Secondary | ICD-10-CM

## 2023-08-11 DIAGNOSIS — Z79899 Other long term (current) drug therapy: Secondary | ICD-10-CM

## 2023-08-11 DIAGNOSIS — Z8639 Personal history of other endocrine, nutritional and metabolic disease: Secondary | ICD-10-CM

## 2023-08-11 NOTE — Telephone Encounter (Signed)
Patient had OV today. She states she did receive renewal application from [employer] for Otezla PAP. She will mail back to clinic so that we may complete MD portion  Chesley Mires, PharmD, MPH, BCPS, CPP Clinical Pharmacist (Rheumatology and Pulmonology)

## 2023-08-12 LAB — BASIC METABOLIC PANEL WITH GFR
BUN: 14 mg/dL (ref 7–25)
CO2: 26 mmol/L (ref 20–32)
Calcium: 9.6 mg/dL (ref 8.6–10.4)
Chloride: 106 mmol/L (ref 98–110)
Creat: 0.97 mg/dL (ref 0.60–1.00)
Glucose, Bld: 89 mg/dL (ref 65–99)
Potassium: 4.4 mmol/L (ref 3.5–5.3)
Sodium: 141 mmol/L (ref 135–146)
eGFR: 61 mL/min/{1.73_m2} (ref >=60)

## 2023-08-12 LAB — CBC WITH DIFFERENTIAL/PLATELET
Absolute Lymphocytes: 2309 {cells}/uL (ref 850–3900)
Absolute Monocytes: 398 {cells}/uL (ref 200–950)
Basophils Absolute: 29 {cells}/uL (ref 0–200)
Basophils Relative: 0.6 %
Eosinophils Absolute: 38 {cells}/uL (ref 15–500)
Eosinophils Relative: 0.8 %
HCT: 38.4 % (ref 35.0–45.0)
Hemoglobin: 12 g/dL (ref 11.7–15.5)
MCH: 24.1 pg — ABNORMAL LOW (ref 27.0–33.0)
MCHC: 31.3 g/dL — ABNORMAL LOW (ref 32.0–36.0)
MCV: 77.1 fL — ABNORMAL LOW (ref 80.0–100.0)
MPV: 12.3 fL (ref 7.5–12.5)
Monocytes Relative: 8.3 %
Neutro Abs: 2026 {cells}/uL (ref 1500–7800)
Neutrophils Relative %: 42.2 %
Platelets: 214 10*3/uL (ref 140–400)
RBC: 4.98 10*6/uL (ref 3.80–5.10)
RDW: 15.2 % — ABNORMAL HIGH (ref 11.0–15.0)
Total Lymphocyte: 48.1 %
WBC: 4.8 10*3/uL (ref 3.8–10.8)

## 2023-08-12 LAB — LIPID PANEL
Cholesterol: 177 mg/dL (ref <200)
HDL: 84 mg/dL (ref >=50)
LDL Cholesterol (Calc): 80 mg/dL
Non-HDL Cholesterol (Calc): 93 mg/dL (ref <130)
Total CHOL/HDL Ratio: 2.1 (calc) (ref <5.0)
Triglycerides: 44 mg/dL (ref <150)

## 2023-08-12 LAB — HEPATIC FUNCTION PANEL
AG Ratio: 1.8 (calc) (ref 1.0–2.5)
ALT: 11 U/L (ref 6–29)
AST: 21 U/L (ref 10–35)
Albumin: 4.4 g/dL (ref 3.6–5.1)
Alkaline phosphatase (APISO): 98 U/L (ref 37–153)
Bilirubin, Direct: 0.1 mg/dL (ref 0.0–0.2)
Globulin: 2.4 g/dL (ref 1.9–3.7)
Indirect Bilirubin: 0.3 mg/dL (ref 0.2–1.2)
Total Bilirubin: 0.4 mg/dL (ref 0.2–1.2)
Total Protein: 6.8 g/dL (ref 6.1–8.1)

## 2023-08-12 NOTE — Progress Notes (Signed)
CBC stable. BMP WNL Hepatic function panel WNL  Lipid panel WNL

## 2023-08-24 ENCOUNTER — Telehealth: Payer: Self-pay | Admitting: *Deleted

## 2023-08-24 ENCOUNTER — Encounter: Payer: Self-pay | Admitting: *Deleted

## 2023-08-24 NOTE — Telephone Encounter (Signed)
Patient notified of provider's recommendations via my chart

## 2023-08-24 NOTE — Telephone Encounter (Signed)
Source  [place], [place]aren [place] (Patient)   Subject  [name] (Patient)   Topic  Clinical - Lab/Test Results    Communication  Reason for CRM: patient did blood work labs at her rhuemtologist Dr.Deveshwar, Janalyn Rouse, MD and want to know the labs that was done is it enough are sufficent for Dr. Lilyan Punt are do patient need more blood work

## 2023-08-24 NOTE — Telephone Encounter (Signed)
Cholesterol profile looks good, liver function looks good, no additional lab work necessary from our standpoint Next follow-up is in the spring time Keep thing as is follow-up sooner if any issues

## 2023-09-19 ENCOUNTER — Telehealth: Payer: Self-pay | Admitting: Rheumatology

## 2023-09-19 NOTE — Telephone Encounter (Signed)
Pt came in with her patient application for [facility] completed. Pt stated she would like to receive a call when the form is sent in. Pt also states she has a couple questions to ask the pharmacist when she gets a chance. Form is on [employer].

## 2023-09-21 NOTE — Telephone Encounter (Signed)
Received signed patient forms. Provider form placed in Sherron Ales, PA-C's folder for signature  Submitted a Prior Authorization request to [facility] for OTEZLA via CoverMyMeds. Will update once we receive a response.  Key: [id number] Per automated response: PA is not required  Chesley Mires, PharmD, MPH, BCPS, CPP Clinical Pharmacist (Rheumatology and Pulmonology)

## 2023-09-21 NOTE — Telephone Encounter (Signed)
Attempted to return patient's call but VM box is not full and call is not receiving calls

## 2023-09-23 ENCOUNTER — Telehealth: Payer: Self-pay | Admitting: *Deleted

## 2023-09-23 NOTE — Telephone Encounter (Signed)
Nothing additional needed. We are waiting for provider signature  Chesley Mires, PharmD, MPH, BCPS, CPP Clinical Pharmacist (Rheumatology and Pulmonology)

## 2023-09-23 NOTE — Telephone Encounter (Signed)
Patient contacted the office and requested a call back to ensure you received her patient assistance application and that you did not need anything additional.

## 2023-09-26 ENCOUNTER — Other Ambulatory Visit (HOSPITAL_COMMUNITY): Payer: Self-pay

## 2023-09-26 NOTE — Telephone Encounter (Signed)
Received notification from [facility] regarding a prior authorization for OTEZLA. Authorization has been APPROVED from 09/26/23 to 09/25/24. Approval letter sent to scan center.  Submitted Patient Assistance RENEWAL Application to [employer] for OTEZLA along with provider portion, patient portion, medication list, insurance card copy. Will update patient when we receive a response.  Phone #: [phone] Fax #: [phone]

## 2023-10-25 NOTE — Telephone Encounter (Signed)
Spoke with [employer] to discuss status of OTEZLA PAP application. Representative stated the application is complete on the clinic's end. They are currently working with patient's payer to move the case forward. Once this is complete they will call patient and send documentation to clinic with their final decision.   Sofie Rower, PharmD [facility] PGY-1

## 2023-11-01 ENCOUNTER — Other Ambulatory Visit: Payer: Self-pay | Admitting: Family Medicine

## 2023-11-01 DIAGNOSIS — Z0000 Encounter for general adult medical examination without abnormal findings: Secondary | ICD-10-CM

## 2023-11-01 NOTE — Telephone Encounter (Signed)
Received a fax from  [employer] regarding an approval for OTEZLA patient assistance from 11/01/2023 to 10/03/2024. Approval letter sent to scan center.  Phone #: [phone] Fax #: [phone]  Chesley Mires, PharmD, MPH, BCPS, CPP Clinical Pharmacist (Rheumatology and Pulmonology)

## 2023-11-14 ENCOUNTER — Ambulatory Visit
Admission: RE | Admit: 2023-11-14 | Discharge: 2023-11-14 | Disposition: A | Discharge status: Home / Self Care | Payer: Medicare Other | Source: Ambulatory Visit | Attending: Family Medicine | Admitting: Family Medicine

## 2023-11-14 DIAGNOSIS — Z0000 Encounter for general adult medical examination without abnormal findings: Secondary | ICD-10-CM

## 2023-12-12 ENCOUNTER — Encounter: Payer: Self-pay | Admitting: Family Medicine

## 2023-12-12 ENCOUNTER — Ambulatory Visit: Payer: Medicare Other | Admitting: Family Medicine

## 2023-12-12 VITALS — BP 116/62 | HR 53 | Temp 96.8°F | Ht 62.0 in | Wt 142.0 lb

## 2023-12-12 DIAGNOSIS — R42 Dizziness and giddiness: Secondary | ICD-10-CM

## 2023-12-12 DIAGNOSIS — E785 Hyperlipidemia, unspecified: Secondary | ICD-10-CM | POA: Diagnosis not present

## 2023-12-12 DIAGNOSIS — Z79899 Other long term (current) drug therapy: Secondary | ICD-10-CM | POA: Diagnosis not present

## 2023-12-12 DIAGNOSIS — L4050 Arthropathic psoriasis, unspecified: Secondary | ICD-10-CM | POA: Diagnosis not present

## 2023-12-12 DIAGNOSIS — R430 Anosmia: Secondary | ICD-10-CM

## 2023-12-12 MED ORDER — ROSUVASTATIN CALCIUM 5 MG PO TABS: 5.0000 mg | ORAL_TABLET | Freq: Every day | ORAL | 3 refills | Status: AC

## 2023-12-12 NOTE — Progress Notes (Signed)
 Subjective:    Patient ID: [name], female    DOB: Sep 07, 1946, 78 y.o.   MRN: [id number]  Discussed the use of AI scribe software for clinical note transcription with the patient, who gave verbal consent to proceed.  History of Present Illness   The patient is a 78 year old who presents with episodes of dizziness and decreased sense of smell.  She experiences episodes of dizziness characterized by a feeling of unsteadiness rather than vertigo. These episodes last approximately five seconds and occur both while lying in bed and after stepping out of the bathroom. She recalls similar issues in the past related to an inner ear problem but notes that the current sensation feels different. Her balance is generally stable when walking or moving around the house.  She has noticed a significant decrease in her sense of smell over the past few years, which she attributes to overuse of Dupixent, a medication she has since discontinued. Currently, she can occasionally detect smells but often cannot, including strong odors like onions or unpleasant smells.  She manages her psoriasis with [name], taking one or two tablets daily. She has experimented with reducing the dosage, which led to the return of patches. Consistent use of the medication helps manage the condition effectively.  She is actively trying to maintain a healthy diet, focusing on reducing sweets, breads, and sweet drinks to manage her cholesterol and hemoglobin levels. She also monitors her water intake to ensure adequate hydration. She engages in regular physical activity, including walking and occasional balance exercises, although she is not consistent with the latter. She feels her balance is generally good and does not experience issues during her daily activities.         Review of Systems     Objective:    Physical Exam        General-in no acute distress Eyes-no discharge Lungs-respiratory rate normal,  CTA CV-no murmurs,RRR Extremities skin warm dry no edema Neuro grossly normal Behavior normal, alert        Assessment & Plan:  Assessment and Plan    Episodes of Dizziness Brief, intermittent episodes of unsteadiness. No associated vertigo or imbalance during daily activities. No other neurological symptoms reported. -Provided patient with balance exercises to maintain current balance abilities and prevent future deterioration.  Hyposmia Reduced sense of smell for several years, possibly related to previous nasal spray use. No other associated symptoms. -Referral to an Ear, Nose, and Throat specialist for further evaluation.  Psoriasis Patient reports occasional skipping of Otezla medication leading to increased patches. -Advised to continue Otezla as prescribed to manage psoriasis symptoms.  Hyperlipidemia Patient's LDL cholesterol at 80, with a desire to further lower cholesterol levels. Currently on cholesterol medication. -Advised to continue current cholesterol medication at the same dose.  General Health Maintenance -Encouraged to continue healthy diet and active lifestyle. -Advised to get annual flu shot. -Plan to follow up in the fall. -Refill cholesterol medication prescription to [facility].      Results for orders placed or performed in visit on 08/11/23  CBC with Differential/Platelet   Collection Time: 08/11/23  8:28 AM  Result Value Ref Range   WBC 4.8 3.8 - 10.8 Thousand/uL   RBC 4.98 3.80 - 5.10 Million/uL   Hemoglobin 12.0 11.7 - 15.5 g/dL   HCT 40.9 81.1 - 91.4 %   MCV 77.1 (L) 80.0 - 100.0 fL   MCH 24.1 (L) 27.0 - 33.0 pg   MCHC 31.3 (L) 32.0 -  36.0 g/dL   RDW 86.5 (H) 78.4 - 69.6 %   Platelets 214 140 - 400 Thousand/uL   MPV 12.3 7.5 - 12.5 fL   Neutro Abs 2,026 1,500 - 7,800 cells/uL   Absolute Lymphocytes 2,309 850 - 3,900 cells/uL   Absolute Monocytes 398 200 - 950 cells/uL   Eosinophils Absolute 38 15 - 500 cells/uL   Basophils  Absolute 29 0 - 200 cells/uL   Neutrophils Relative % 42.2 %   Total Lymphocyte 48.1 %   Monocytes Relative 8.3 %   Eosinophils Relative 0.8 %   Basophils Relative 0.6 %  Lipid panel   Collection Time: 08/11/23  8:28 AM  Result Value Ref Range   Cholesterol 177 <200 mg/dL   HDL 84 > OR = 50 mg/dL   Triglycerides 44 <295 mg/dL   LDL Cholesterol (Calc) 80 mg/dL (calc)   Total CHOL/HDL Ratio 2.1 <5.0 (calc)   Non-HDL Cholesterol (Calc) 93 <284 mg/dL (calc)  Hepatic function panel   Collection Time: 08/11/23  8:28 AM  Result Value Ref Range   Total Protein 6.8 6.1 - 8.1 g/dL   Albumin 4.4 3.6 - 5.1 g/dL   Globulin 2.4 1.9 - 3.7 g/dL (calc)   AG Ratio 1.8 1.0 - 2.5 (calc)   Total Bilirubin 0.4 0.2 - 1.2 mg/dL   Bilirubin, Direct 0.1 0.0 - 0.2 mg/dL   Indirect Bilirubin 0.3 0.2 - 1.2 mg/dL (calc)   Alkaline phosphatase (APISO) 98 37 - 153 U/L   AST 21 10 - 35 U/L   ALT 11 6 - 29 U/L  BASIC METABOLIC PANEL WITH GFR   Collection Time: 08/11/23  8:28 AM  Result Value Ref Range   Glucose, Bld 89 65 - 99 mg/dL   BUN 14 7 - 25 mg/dL   Creat 1.32 4.40 - 1.02 mg/dL   eGFR 61 > OR = 60 VO/ZDG/6.44I3   BUN/Creatinine Ratio SEE NOTE: 6 - 22 (calc)   Sodium 141 135 - 146 mmol/L   Potassium 4.4 3.5 - 5.3 mmol/L   Chloride 106 98 - 110 mmol/L   CO2 26 20 - 32 mmol/L   Calcium 9.6 8.6 - 10.4 mg/dL   1. Hyperlipidemia, unspecified hyperlipidemia type (Primary) Continue statin healthy diet  2. Psoriatic arthritis [facility]) Patient sees rheumatology every 6 months  3. High risk medication use Previous labs in November looked good no need to repeat currently  4. Loss of smell To see ENT for further evaluation - Ambulatory referral to ENT

## 2024-01-10 NOTE — Progress Notes (Signed)
 Office Visit Note  Patient: [name]             Date of Birth: [birth year]           MRN: [id number]             PCP: Bennet Brasil, MD Referring: Bennet Brasil, MD Visit Date: 01/24/2024 Occupation: @GUAROCC @  Subjective:  Medication management  History of Present Illness: LEON GOODNOW is a 78 y.o. female with psoriatic arthritis, psoriasis and osteoarthritis.  She returns today after last visit in November 2024.  She is on Otezla  30 mg p.o. twice daily.  She states her next shipment of Otezla  is coming and she has missed 2 days of Otezla .  She noticed some breakout of psoriasis on her elbows and her knees.  She denies any history of inflammatory arthritis, Achilles tendinitis or plantar fasciitis.  She denies any SI joint discomfort.    Activities of Daily Living:  Patient reports morning stiffness for 0 minutes.   Patient Denies nocturnal pain.  Difficulty dressing/grooming: Denies Difficulty climbing stairs: Denies Difficulty getting out of chair: Denies Difficulty using hands for taps, buttons, cutlery, and/or writing: Denies  Review of Systems  Constitutional:  Negative for fatigue.  HENT:  Negative for mouth sores and mouth dryness.   Eyes:  Negative for dryness.  Respiratory:  Negative for shortness of breath.   Cardiovascular:  Negative for chest pain and palpitations.  Gastrointestinal:  Negative for blood in stool, constipation and diarrhea.  Endocrine: Negative for increased urination.  Genitourinary:  Negative for involuntary urination.  Musculoskeletal:  Negative for joint pain, gait problem, joint pain, joint swelling, myalgias, muscle weakness, morning stiffness, muscle tenderness and myalgias.  Skin:  Positive for rash. Negative for color change, hair loss and sensitivity to sunlight.  Allergic/Immunologic: Negative for susceptible to infections.  Neurological:  Negative for dizziness and headaches.  Hematological:  Negative for swollen  glands.  Psychiatric/Behavioral:  Positive for sleep disturbance. Negative for depressed mood. The patient is not nervous/anxious.     PMFS History:  Patient Active Problem List   Diagnosis Date Noted   Generalized weakness 03/16/2022   Bilateral high frequency sensorineural hearing loss 06/28/2019   History of hyperlipidemia 02/09/2017   Other psoriasis 12/30/2016   High risk medications (not anticoagulants) long-term use 12/30/2016   Psoriatic arthritis (HCC) 12/27/2014   Vitamin D  deficiency 08/06/2013   Osteopenia 08/06/2013   Insomnia 02/22/2013   Hyperlipidemia 01/09/2013   Osteoarthritis of shoulder 09/15/2010    Past Medical History:  Diagnosis Date   Anemia 1986   Cataract Have but not ready to be removed.   Hx of colonic polyps    Hypercholesterolemia    Psoriatic arthritis (HCC)     Family History  Problem Relation Age of Onset   Anesthesia problems Mother    Cancer Mother    Hypertension Mother    Breast cancer Mother    Arthritis Mother    Obesity Mother    Cancer Father    Diabetes Father    Deep vein thrombosis Father    Hypertension Father    Hypertension Sister    Diabetes Sister    Hearing loss Sister    Obesity Sister    Kidney failure Sister    Breast cancer Sister    Migraines Sister    Migraines Sister    Breast cancer Sister    Hypertension Brother    Heart disease Brother    Cancer Brother  Obesity Brother    Early death Brother    Heart Problems Brother        Leaky valve   Alcohol abuse Maternal Uncle    Arthritis Other        FH   Diabetes Other        FH   Cancer Other        FH   Hypotension Neg Hx    Malignant hyperthermia Neg Hx    Pseudochol deficiency Neg Hx    Past Surgical History:  Procedure Laterality Date   ABDOMINAL HYSTERECTOMY     COLONOSCOPY  08/25/2011   Procedure: COLONOSCOPY;  Surgeon: Ruby Corporal, MD;  [facility];  Service: Endoscopy;  Laterality: N/A;  12:00   COLONOSCOPY N/A  09/21/2018   Procedure: COLONOSCOPY;  Surgeon: Ruby Corporal, MD;  [facility];  Service: Endoscopy;  Laterality: N/A;  830   left knee stable out  10/04/1964   left knee staple in  10/04/1960   VESICOVAGINAL FISTULA CLOSURE W/ TAH  10/04/1984   Social History   Social History Narrative   Retired, Airline pilot.   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   No biological children, but has fostered many.   Several siblings living.    Immunization History  Administered Date(s) Administered   Fluad Quad(high Dose 65+) 10/02/2020, 07/29/2021   Influenza,inj,Quad PF,6+ Mos 06/19/2019   Influenza-Unspecified 07/13/2016, 08/02/2018, 08/25/2022   Moderna Sars-Covid-2 Vaccination 11/28/2019, 12/26/2019, 08/26/2020, 05/15/2021   PPD Test 11/06/2019   Pneumococcal Conjugate-13 12/27/2014   Pneumococcal Polysaccharide-23 03/30/2016   Pneumococcal-Unspecified 11/06/2009   Zoster Recombinant(Shingrix) 07/07/2021, 08/25/2022   Zoster, Live 07/02/2016     Objective: Vital Signs: BP 118/77 (BP Location: Left Arm, Patient Position: Sitting, Cuff Size: Normal)   Pulse (!) 56   Resp 16   Ht 5\' 2"  (1.575 m)   Wt 142 lb 9.6 oz (64.7 kg)   BMI 26.08 kg/m    Physical Exam Vitals and nursing note reviewed.  Constitutional:      Appearance: She is well-developed.  HENT:     Head: Normocephalic and atraumatic.  Eyes:     Conjunctiva/sclera: Conjunctivae normal.  Cardiovascular:     Rate and Rhythm: Normal rate and regular rhythm.     Heart sounds: Normal heart sounds.  Pulmonary:     Effort: Pulmonary effort is normal.     Breath sounds: Normal breath sounds.  Abdominal:     General: Bowel sounds are normal.     Palpations: Abdomen is soft.  Musculoskeletal:     Cervical back: Normal range of motion.  Lymphadenopathy:     Cervical: No cervical adenopathy.  Skin:    General: Skin is warm and dry.     Capillary Refill: Capillary refill takes less than 2 seconds.     Comments: Psoriasis  noted on ankles and knees.  Dystrophy of toenails was noted.  Neurological:     Mental Status: She is alert and oriented to person, place, and time.  Psychiatric:        Behavior: Behavior normal.      Musculoskeletal Exam: Cervical, thoracic and lumbar spine with good range of motion.  Shoulder joints, elbow joints, wrist joints, MCPs PIPs and DIPs with good range of motion with no synovitis.  Bilateral PIP and DIP thickening with no dactylitis was noted.  She had limited range of motion of the hip joints without discomfort.  Knee joints were in good range of motion without any  warmth swelling or effusion.  There was no tenderness over ankles and MTPs.  There was no SI joint tenderness.  There was no plantar fasciitis or Achilles tendinitis.  CDAI Exam: CDAI Score: -- Patient Global: --; Provider Global: -- Swollen: --; Tender: -- Joint Exam 01/24/2024   No joint exam has been documented for this visit   There is currently no information documented on the homunculus. Go to the Rheumatology activity and complete the homunculus joint exam.  Investigation: No additional findings.  Imaging: No results found.  Recent Labs: Lab Results  Component Value Date   WBC 4.8 08/11/2023   HGB 12.0 08/11/2023   PLT 214 08/11/2023   NA 141 08/11/2023   K 4.4 08/11/2023   CL 106 08/11/2023   CO2 26 08/11/2023   GLUCOSE 89 08/11/2023   BUN 14 08/11/2023   CREATININE 0.97 08/11/2023   BILITOT 0.4 08/11/2023   ALKPHOS 110 06/02/2022   AST 21 08/11/2023   ALT 11 08/11/2023   PROT 6.8 08/11/2023   ALBUMIN 4.5 06/02/2022   CALCIUM  9.6 08/11/2023   GFRAA 73 08/04/2020   August 31, 2023 CBC and CMP were normal.  Speciality Comments: No specialty comments available.  Procedures:  No procedures performed Allergies: Etodolac   Assessment / Plan:     Visit Diagnoses: Psoriatic arthritis (HCC) -patient had no synovitis on the examination today.  Patient states that she has not had a  flare of psoriatic arthritis since the last visit.  She ran out of Otezla  2 days ago and is having mild flare of psoriasis.  She denies Achilles tendinitis, plantar fasciitis or sacroiliitis.  Psoriasis - Psoriasis patches on extensor surface of right elbow, anterior aspect of right knee.  She uses topical agents.  Patient does not wish to change treatment.  The Sheilla Der was discussed with the previous visits.  High risk medications (not anticoagulants) long-term use - Otezla  30 mg p.o. twice daily.  CBC and CMP were normal on August 11, 2023.  Primary osteoarthritis of left knee-she has intermittent discomfort.  No warmth swelling or effusion was noted.  Osteopenia of multiple sites - DEXA on 07/26/22: The BMD measured at Forearm Radius 33% is 0.573 g/cm2 with a T-scoreof -2.0.  She will have repeat DEXA scan later this year.  Use of calcium  rich diet and vitamin D  was discussed.  Resistive exercises were discussed.  She will have water  aerobics on a regular basis.  Vitamin D  deficiency-she has been taking vitamin D .  Other insomnia  History of hyperlipidemia  History of anemia  Orders: No orders of the defined types were placed in this encounter.  No orders of the defined types were placed in this encounter.    Follow-Up Instructions: Return in about 6 months (around 07/25/2024) for Psoriatic arthritis.   Nicholas Bari, MD  Note - This record has been created using Animal nutritionist.  Chart creation errors have been sought, but may not always  have been located. Such creation errors do not reflect on  the standard of medical care.

## 2024-01-24 ENCOUNTER — Ambulatory Visit: Payer: Medicare Other | Attending: Rheumatology | Admitting: Rheumatology

## 2024-01-24 ENCOUNTER — Encounter: Payer: Self-pay | Admitting: Rheumatology

## 2024-01-24 VITALS — BP 118/77 | HR 56 | Resp 16 | Ht 62.0 in | Wt 142.6 lb

## 2024-01-24 DIAGNOSIS — L409 Psoriasis, unspecified: Secondary | ICD-10-CM

## 2024-01-24 DIAGNOSIS — L4050 Arthropathic psoriasis, unspecified: Secondary | ICD-10-CM

## 2024-01-24 DIAGNOSIS — M1712 Unilateral primary osteoarthritis, left knee: Secondary | ICD-10-CM | POA: Diagnosis not present

## 2024-01-24 DIAGNOSIS — G4709 Other insomnia: Secondary | ICD-10-CM

## 2024-01-24 DIAGNOSIS — Z8639 Personal history of other endocrine, nutritional and metabolic disease: Secondary | ICD-10-CM

## 2024-01-24 DIAGNOSIS — Z79899 Other long term (current) drug therapy: Secondary | ICD-10-CM | POA: Diagnosis not present

## 2024-01-24 DIAGNOSIS — Z862 Personal history of diseases of the blood and blood-forming organs and certain disorders involving the immune mechanism: Secondary | ICD-10-CM

## 2024-01-24 DIAGNOSIS — E559 Vitamin D deficiency, unspecified: Secondary | ICD-10-CM

## 2024-01-24 DIAGNOSIS — M8589 Other specified disorders of bone density and structure, multiple sites: Secondary | ICD-10-CM

## 2024-02-22 ENCOUNTER — Telehealth (INDEPENDENT_AMBULATORY_CARE_PROVIDER_SITE_OTHER): Payer: Self-pay | Admitting: Otolaryngology

## 2024-02-22 NOTE — Telephone Encounter (Signed)
 Confirmed appt & location [id number] afm

## 2024-02-24 ENCOUNTER — Encounter (INDEPENDENT_AMBULATORY_CARE_PROVIDER_SITE_OTHER): Payer: Self-pay | Admitting: Otolaryngology

## 2024-02-24 ENCOUNTER — Ambulatory Visit (INDEPENDENT_AMBULATORY_CARE_PROVIDER_SITE_OTHER): Admitting: Otolaryngology

## 2024-02-24 VITALS — BP 136/72 | HR 49

## 2024-02-24 DIAGNOSIS — J343 Hypertrophy of nasal turbinates: Secondary | ICD-10-CM

## 2024-02-24 DIAGNOSIS — J342 Deviated nasal septum: Secondary | ICD-10-CM

## 2024-02-24 DIAGNOSIS — R0982 Postnasal drip: Secondary | ICD-10-CM

## 2024-02-24 DIAGNOSIS — R0981 Nasal congestion: Secondary | ICD-10-CM | POA: Diagnosis not present

## 2024-02-24 DIAGNOSIS — R439 Unspecified disturbances of smell and taste: Secondary | ICD-10-CM | POA: Diagnosis not present

## 2024-02-24 DIAGNOSIS — J3089 Other allergic rhinitis: Secondary | ICD-10-CM

## 2024-02-24 MED ORDER — FLUTICASONE PROPIONATE 50 MCG/ACT NA SUSP: 2.0000 | Freq: Two times a day (BID) | NASAL | 6 refills | Status: DC

## 2024-02-24 MED ORDER — LEVOCETIRIZINE DIHYDROCHLORIDE 5 MG PO TABS: 5.0000 mg | ORAL_TABLET | Freq: Every evening | ORAL | 3 refills | Status: DC

## 2024-02-24 NOTE — Patient Instructions (Signed)
.  Olfactory Training: Several times per day, place the following up to your nose and make attempts to smell them for at least 20 seconds at a time. Do this at least twice per day with each odorant. You may substitute or add other strong odorants if you prefer.You also may place them in a paper bag and pace the open end of the bag to your face to smell the odor. Lemon peel Rose Cloves Eucalyptus  I also have people try this as frequently as possible with other odorants (coffee, orange peel, etc).  Other ideas for anosmia: topical treatment with vitamin A, nasal steroid irrigations (not just spray), omega-3, topical sodium citrate

## 2024-02-24 NOTE — Progress Notes (Unsigned)
 ENT CONSULT:  Reason for Consult: loss of smell  for a few years   HPI: Discussed the use of AI scribe software for clinical note transcription with the patient, who gave verbal consent to proceed.  History of Present Illness [name] is a 78 year old female who presents with difficulty smelling.  She has experienced difficulty with her sense of smell for several years, describing it as inconsistent, with occasional ability to detect odors. The onset of her symptoms is attributed to frequent use of a Vicks inhaler, which she used upon her sister's suggestion to relieve nasal congestion. She has since discontinued its use.  There is no history of allergies to dust or pollen, and she has not used any nasal sprays or allergy medications in the past. No nasal pain, epistaxis, or postnasal drip. She has no history of nasal or facial surgeries, nor any nasal trauma.  She was diagnosed with COVID-19 in the past, but her symptoms of smell loss began prior to this diagnosis. She did not experience any noticeable side effects from COVID-19 and was unaware of having it at the time.  No history of stroke and she has not undergone any imaging studies related to her condition.     Records Reviewed:  ***    Past Medical History:  Diagnosis Date   Anemia 1986   Cataract Have but not ready to be removed.   Hx of colonic polyps    Hypercholesterolemia    Psoriatic arthritis (HCC)     Past Surgical History:  Procedure Laterality Date   ABDOMINAL HYSTERECTOMY     COLONOSCOPY  08/25/2011   Procedure: COLONOSCOPY;  Surgeon: Ruby Corporal, MD;  [facility];  Service: Endoscopy;  Laterality: N/A;  12:00   COLONOSCOPY N/A 09/21/2018   Procedure: COLONOSCOPY;  Surgeon: Ruby Corporal, MD;  [facility];  Service: Endoscopy;  Laterality: N/A;  830   left knee stable out  10/04/1964   left knee staple in  10/04/1960   VESICOVAGINAL FISTULA CLOSURE W/ TAH   10/04/1984    Family History  Problem Relation Age of Onset   Anesthesia problems Mother    Cancer Mother    Hypertension Mother    Breast cancer Mother    Arthritis Mother    Obesity Mother    Cancer Father    Diabetes Father    Deep vein thrombosis Father    Hypertension Father    Hypertension Sister    Diabetes Sister    Hearing loss Sister    Obesity Sister    Kidney failure Sister    Breast cancer Sister    Migraines Sister    Migraines Sister    Breast cancer Sister    Hypertension Brother    Heart disease Brother    Cancer Brother    Obesity Brother    Early death Brother    Heart Problems Brother        Leaky valve   Alcohol abuse Maternal Uncle    Arthritis Other        FH   Diabetes Other        FH   Cancer Other        FH   Hypotension Neg Hx    Malignant hyperthermia Neg Hx    Pseudochol deficiency Neg Hx     Social History:  reports that she has never smoked. She has been exposed to tobacco smoke. She has never used smokeless tobacco.  She reports that she does not drink alcohol and does not use drugs.  Allergies:  Allergies  Allergen Reactions   Etodolac Other (See Comments)    REACTION: chest pain and nausea    Medications: I have reviewed the patient's current medications.  The PMH, PSH, Medications, Allergies, and SH were reviewed and updated.  ROS: Constitutional: Negative for fever, weight loss and weight gain. Cardiovascular: Negative for chest pain and dyspnea on exertion. Respiratory: Is not experiencing shortness of breath at rest. Gastrointestinal: Negative for nausea and vomiting. Neurological: Negative for headaches. Psychiatric: The patient is not nervous/anxious  There were no vitals taken for this visit. There is no height or weight on file to calculate BMI.  PHYSICAL EXAM:  Exam: General: Well-developed, well-nourished Communication and Voice: Clear pitch and clarity Respiratory Respiratory effort: Equal inspiration  and expiration without stridor Cardiovascular Peripheral Vascular: Warm extremities with equal color/perfusion Eyes: No nystagmus with equal extraocular motion bilaterally Neuro/Psych/Balance: Patient oriented to person, place, and time; Appropriate mood and affect; Gait is intact with no imbalance; Cranial nerves I-XII are intact Head and Face Inspection: Normocephalic and atraumatic without mass or lesion Palpation: Facial skeleton intact without bony stepoffs Salivary Glands: No mass or tenderness Facial Strength: Facial motility symmetric and full bilaterally ENT Pinna: External ear intact and fully developed External canal: Canal is patent with intact skin Tympanic Membrane: Clear and mobile External Nose: No scar or anatomic deformity Internal Nose: Septum is ***. No polyp, or purulence. Mucosal edema and erythema present.  Bilateral inferior turbinate hypertrophy.  Lips, Teeth, and gums: Mucosa and teeth intact and viable TMJ: No pain to palpation with full mobility Oral cavity/oropharynx: No erythema or exudate, no lesions present Nasopharynx: No mass or lesion with intact mucosa Hypopharynx: Intact mucosa without pooling of secretions Larynx Glottic: Full true vocal cord mobility without lesion or mass Supraglottic: Normal appearing epiglottis and AE folds Interarytenoid Space: Moderate pachydermia&edema Subglottic Space: Patent without lesion or edema Neck Neck and Trachea: Midline trachea without mass or lesion Thyroid: No mass or nodularity Lymphatics: No lymphadenopathy  Procedure:   PROCEDURE NOTE: nasal endoscopy  Preoperative diagnosis: chronic sinusitis symptoms  Postoperative diagnosis: same  Procedure: Diagnostic nasal endoscopy (16109)  Surgeon: Artice Last, M.D.  Anesthesia: Topical lidocaine and Afrin  H&P REVIEW: The patient's history and physical were reviewed today prior to procedure. All medications were reviewed and updated as  well. Complications: None Condition is stable throughout exam Indications and consent: The patient presents with symptoms of chronic sinusitis not responding to previous therapies. All the risks, benefits, and potential complications were reviewed with the patient preoperatively and informed consent was obtained. The time out was completed with confirmation of the correct procedure.   Procedure: The patient was seated upright in the clinic. Topical lidocaine and Afrin were applied to the nasal cavity. After adequate anesthesia had occurred, the rigid nasal endoscope was passed into the nasal cavity. The nasal mucosa, turbinates, septum, and sinus drainage pathways were visualized bilaterally. This revealed *** purulence or significant secretions that might be cultured. There were *** polyps or sites of significant inflammation. The mucosa was intact and there *** crusting present. The scope was then slowly withdrawn and the patient tolerated the procedure well. There were no complications or blood loss.      Studies Reviewed:***  Assessment/Plan: No diagnosis found.  Assessment and Plan Assessment & Plan Nasal congestion Chronic nasal congestion without allergies or decongestant spray use. Likely exacerbated by past Vicks use. Prioritized as  the most common and treatable cause of symptoms. - Prescribe daily allergy pill and nasal spray for 2-3 months. - Provide information on olfactory retraining techniques. - Refer for allergy testing. - Consider neurology referral if initial treatment is ineffective.  Loss of sense of smell Intermittent anosmia for several years. Differential includes nasal congestion, age-related changes, medication side effects, or neurological issues. Smell retraining suggested as initial approach. - Include olfactory retraining information in after visit summary. - Consider neurology referral if initial treatment is ineffective. - Advise discussing medication side  effects with pharmacist.      Thank you for allowing me to participate in the care of this patient. Please do not hesitate to contact me with any questions or concerns.   Artice Last, MD Otolaryngology [facility] Phone: [phone] Fax: [phone]    02/24/2024, 10:52 AM

## 2024-03-23 ENCOUNTER — Ambulatory Visit: Payer: Medicare Other

## 2024-03-23 VITALS — Ht 62.0 in | Wt 142.0 lb

## 2024-03-23 DIAGNOSIS — Z0000 Encounter for general adult medical examination without abnormal findings: Secondary | ICD-10-CM

## 2024-03-23 NOTE — Patient Instructions (Signed)
 [name] , Thank you for taking time out of your busy schedule to complete your Annual Wellness Visit with me. I enjoyed our conversation and look forward to speaking with you again next year. I, as well as your care team,  appreciate your ongoing commitment to your health goals. Please review the following plan we discussed and let me know if I can assist you in the future. Your Game plan/ To Do List    Follow up Visits: Next Medicare AWV with our clinical staff: In 1 year    Have you seen your provider in the last 6 months (3 months if uncontrolled diabetes)? Yes Next Office Visit with your provider: 06/13/24@ 11  Clinician Recommendations:  Aim for 30 minutes of exercise or brisk walking, 6-8 glasses of water , and 5 servings of fruits and vegetables each day.       This is a list of the screening recommended for you and due dates:  Health Maintenance  Topic Date Due   DTaP/Tdap/Td vaccine (1 - Tdap) Never done   COVID-19 Vaccine (5 - 2024-25 season) 06/05/2023   Flu Shot  05/04/2024   Mammogram  11/13/2024   Medicare Annual Wellness Visit  03/23/2025   Pneumococcal Vaccine for age over 74  Completed   DEXA scan (bone density measurement)  Completed   Hepatitis C Screening  Completed   Zoster (Shingles) Vaccine  Completed   HPV Vaccine  Aged Out   Meningitis B Vaccine  Aged Out    Advanced directives: (ACP Link)Information on Advanced Care Planning can be found at [place]  Secretary of [facility] Advance Health Care Directives Advance Health Care Directives. [email or web address]   Advance Care Planning is important because it:  [x]  Makes sure you receive the medical care that is consistent with your values, goals, and preferences  [x]  It provides guidance to your family and loved ones and reduces their decisional burden about whether or not they are making the right decisions based on your wishes.  Follow the link provided in your after visit summary or read over the paperwork we have  mailed to you to help you started getting your Advance Directives in place. If you need assistance in completing these, please reach out to us  so that we can help you!  See attachments for Preventive Care and Fall Prevention Tips.

## 2024-03-23 NOTE — Progress Notes (Signed)
 Subjective:   [name] is a 79 y.o. who presents for a Medicare Wellness preventive visit.  As a reminder, Annual Wellness Visits don't include a physical exam, and some assessments may be limited, especially if this visit is performed virtually. We may recommend an in-person follow-up visit with your provider if needed.  Visit Complete: Virtual I connected with  [name] on 03/23/24 by a audio enabled telemedicine application and verified that I am speaking with the correct person using two identifiers.  Patient Location: Home  Provider Location: Home Office  I discussed the limitations of evaluation and management by telemedicine. The patient expressed understanding and agreed to proceed.  Vital Signs: Because this visit was a virtual/telehealth visit, some criteria may be missing or patient reported. Any vitals not documented were not able to be obtained and vitals that have been documented are patient reported.  VideoDeclined- This patient declined Librarian, academic. Therefore the visit was completed with audio only.  Persons Participating in Visit: Patient.  AWV Questionnaire: Yes: Patient Medicare AWV questionnaire was completed by the patient on 03/19/24; I have confirmed that all information answered by patient is correct and no changes since this date.  Cardiac Risk Factors include: advanced age (>49men, >22 women);dyslipidemia     Objective:    Today's Vitals   03/23/24 1600  Weight: 142 lb (64.4 kg)  Height: 5' 2 (1.575 m)   Body mass index is 25.97 kg/m.     03/23/2024    4:04 PM 03/18/2023    3:44 PM 02/09/2022   10:58 AM 12/17/2021    3:30 PM 02/03/2021   11:07 AM 11/09/2019    8:24 AM 09/21/2018    7:27 AM  Advanced Directives  Does Patient Have a Medical Advance Directive? No No No No No No No   Would patient like information on creating a medical advance directive? Yes (MAU/Ambulatory/Procedural Areas -  Information given) No - Patient declined No - Patient declined No - Patient declined No - Patient declined No - Patient declined No - Patient declined      Data saved with a previous flowsheet row definition    Current Medications (verified) Outpatient Encounter Medications as of 03/23/2024  Medication Sig   Apremilast  (OTEZLA ) 30 MG TABS Take 1 tablet (30 mg total) by mouth 2 (two) times daily.   Cholecalciferol (VITAMIN D3) 50 MCG (2000 UT) TABS Take 2,000 Units by mouth every evening.   fluticasone  (FLONASE ) 50 MCG/ACT nasal spray Place 2 sprays into both nostrils 2 (two) times daily.   levocetirizine (XYZAL  ALLERGY 24HR) 5 MG tablet Take 1 tablet (5 mg total) by mouth every evening.   rosuvastatin  (CRESTOR ) 5 MG tablet Take 1 tablet (5 mg total) by mouth daily.   No facility-administered encounter medications on file as of 03/23/2024.    Allergies (verified) Etodolac and Acetic acid   History: Past Medical History:  Diagnosis Date   Anemia 1986   Cataract Have but not ready to be removed.   Hx of colonic polyps    Hypercholesterolemia    Psoriatic arthritis (HCC)    Past Surgical History:  Procedure Laterality Date   ABDOMINAL HYSTERECTOMY     COLONOSCOPY  08/25/2011   Procedure: COLONOSCOPY;  Surgeon: Ruby Corporal, MD;  [facility];  Service: Endoscopy;  Laterality: N/A;  12:00   COLONOSCOPY N/A 09/21/2018   Procedure: COLONOSCOPY;  Surgeon: Ruby Corporal, MD;  [facility];  Service:  Endoscopy;  Laterality: N/A;  830   left knee stable out  10/04/1964   left knee staple in  10/04/1960   VESICOVAGINAL FISTULA CLOSURE W/ TAH  10/04/1984   Family History  Problem Relation Age of Onset   Anesthesia problems Mother    Cancer Mother    Hypertension Mother    Breast cancer Mother    Arthritis Mother    Obesity Mother    Cancer Father    Diabetes Father    Deep vein thrombosis Father    Hypertension Father    Hypertension Sister     Diabetes Sister    Hearing loss Sister    Obesity Sister    Kidney failure Sister    Breast cancer Sister    Migraines Sister    Cancer Sister    Migraines Sister    Cancer Sister    Breast cancer Sister    Hypertension Brother    Heart disease Brother    Cancer Brother    Obesity Brother    Early death Brother    Heart Problems Brother        Leaky valve   Alcohol abuse Maternal Uncle    Arthritis Other        FH   Diabetes Other        FH   Cancer Other        FH   Hypotension Neg Hx    Malignant hyperthermia Neg Hx    Pseudochol deficiency Neg Hx    Social History   Socioeconomic History   Marital status: Widowed    Spouse name: Not on file   Number of children: 0   Years of education: Not on file   Highest education level: Associate degree: academic program  Occupational History   Not on file  Tobacco Use   Smoking status: Never    Passive exposure: Past   Smokeless tobacco: Never   Tobacco comments:    Never smoked.  Vaping Use   Vaping status: Never Used  Substance and Sexual Activity   Alcohol use: No   Drug use: No   Sexual activity: Not Currently    Birth control/protection: Abstinence, None  Other Topics Concern   Not on file  Social History Narrative   Retired, Airline pilot.   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   No biological children, but has fostered many.   Several siblings living.    Social Drivers of Corporate investment banker Strain: Low Risk  (03/19/2024)   Overall Financial Resource Strain (CARDIA)    Difficulty of Paying Living Expenses: Not hard at all  Food Insecurity: No Food Insecurity (03/19/2024)   Hunger Vital Sign    Worried About Running Out of Food in the Last Year: Never true    Ran Out of Food in the Last Year: Never true  Transportation Needs: No Transportation Needs (03/19/2024)   PRAPARE - Administrator, Civil Service (Medical): No    Lack of Transportation (Non-Medical): No  Physical Activity: Insufficiently  Active (03/19/2024)   Exercise Vital Sign    Days of Exercise per Week: 3 days    Minutes of Exercise per Session: 40 min  Stress: No Stress Concern Present (03/19/2024)   [employer] of Occupational Health - Occupational Stress Questionnaire    Feeling of Stress: Only a little  Social Connections: Moderately Integrated (03/19/2024)   Social Connection and Isolation Panel    Frequency of Communication with Friends and Family: Three times  a week    Frequency of Social Gatherings with Friends and Family: Three times a week    Attends Religious Services: More than 4 times per year    Active Member of Clubs or Organizations: Yes    Attends Banker Meetings: 1 to 4 times per year    Marital Status: Widowed    Tobacco Counseling Counseling given: Not Answered Tobacco comments: Never smoked.    Clinical Intake:  Pre-visit preparation completed: Yes  Pain : No/denies pain     Diabetes: No  No results found for: HGBA1C   How often do you need to have someone help you when you read instructions, pamphlets, or other written materials from your doctor or pharmacy?: 1 - Never  Interpreter Needed?: No  Information entered by :: Seabron Cypress LPN   Activities of Daily Living     03/23/2024    4:03 PM  In your present state of health, do you have any difficulty performing the following activities:  Hearing? 0  Vision? 0  Difficulty concentrating or making decisions? 0  Walking or climbing stairs? 0  Dressing or bathing? 0  Doing errands, shopping? 0  Preparing Food and eating ? N  Using the Toilet? N  In the past six months, have you accidently leaked urine? N  Do you have problems with loss of bowel control? N  Managing your Medications? N  Managing your Finances? N  Housekeeping or managing your Housekeeping? N    Patient Care Team: Bennet Brasil, MD as PCP - General (Family Medicine) Nicholas Bari, MD as Consulting Physician  (Rheumatology) Artice Last, MD as Consulting Physician (Otolaryngology)  I have updated your Care Teams any recent Medical Services you may have received from other providers in the past year.     Assessment:   This is a routine wellness examination for [name].  Hearing/Vision screen Hearing Screening - Comments:: Denies hearing difficulties    Vision Screening - Comments::  up to date with routine eye exams     Goals Addressed             This Visit's Progress    DIET - EAT MORE FRUITS AND VEGETABLES   On track      Depression Screen     03/23/2024    4:02 PM 12/12/2023   10:19 AM 06/14/2023    8:42 AM 03/18/2023    3:42 PM 12/07/2022    8:31 AM 06/08/2022    8:42 AM 02/09/2022   10:55 AM  PHQ 2/9 Scores  PHQ - 2 Score 1 1 1  0 0 0 0  PHQ- 9 Score  5 4  4       Fall Risk     03/23/2024    4:03 PM 06/14/2023    8:42 AM 03/18/2023    3:44 PM 03/14/2023    9:56 AM 12/07/2022    8:31 AM  Fall Risk   Falls in the past year? 0 0 0 0 0  Number falls in past yr: 0  0 0 0  Injury with Fall? 0  0  0  Risk for fall due to : No Fall Risks  No Fall Risks    Follow up Falls prevention discussed;Education provided;Falls evaluation completed  Falls prevention discussed      MEDICARE RISK AT HOME:  Medicare Risk at Home Any stairs in or around the home?: No If so, are there any without handrails?: No Home free of loose throw rugs in walkways,  pet beds, electrical cords, etc?: Yes Adequate lighting in your home to reduce risk of falls?: Yes Life alert?: No Use of a cane, walker or w/c?: No Grab bars in the bathroom?: Yes Shower chair or bench in shower?: No Elevated toilet seat or a handicapped toilet?: Yes  TIMED UP AND GO:  Was the test performed?  No  Cognitive Function: Declined/Normal: No cognitive concerns noted by patient or family. Patient alert, oriented, able to answer questions appropriately and recall recent events. No signs of memory loss or confusion.         03/18/2023    3:45 PM 02/09/2022   11:06 AM  6CIT Screen  What Year? 0 points 0 points  What month? 0 points 0 points  What time? 0 points 0 points  Count back from 20 0 points 0 points  Months in reverse 0 points 0 points  Repeat phrase 0 points 0 points  Total Score 0 points 0 points    Immunizations Immunization History  Administered Date(s) Administered   Fluad Quad(high Dose 65+) 10/02/2020, 07/29/2021   Influenza,inj,Quad PF,6+ Mos 06/19/2019   Influenza-Unspecified 07/13/2016, 08/02/2018, 08/25/2022   Moderna Sars-Covid-2 Vaccination 11/28/2019, 12/26/2019, 08/26/2020, 05/15/2021   PPD Test 11/06/2019   Pneumococcal Conjugate-13 12/27/2014   Pneumococcal Polysaccharide-23 03/30/2016   Pneumococcal-Unspecified 11/06/2009   Zoster Recombinant(Shingrix) 07/07/2021, 08/25/2022   Zoster, Live 07/02/2016    Screening Tests Health Maintenance  Topic Date Due   DTaP/Tdap/Td (1 - Tdap) Never done   COVID-19 Vaccine (5 - 2024-25 season) 06/05/2023   INFLUENZA VACCINE  05/04/2024   MAMMOGRAM  11/13/2024   Medicare Annual Wellness (AWV)  03/23/2025   Pneumococcal Vaccine: 50+ Years  Completed   DEXA SCAN  Completed   Hepatitis C Screening  Completed   Zoster Vaccines- Shingrix  Completed   HPV VACCINES  Aged Out   Meningococcal B Vaccine  Aged Out    Health Maintenance  Health Maintenance Due  Topic Date Due   DTaP/Tdap/Td (1 - Tdap) Never done   COVID-19 Vaccine (5 - 2024-25 season) 06/05/2023    Additional Screening:  Vision Screening: Recommended annual ophthalmology exams for early detection of glaucoma and other disorders of the eye. Would you like a referral to an eye doctor? No    Dental Screening: Recommended annual dental exams for proper oral hygiene  Community Resource Referral / Chronic Care Management: CRR required this visit?  No   CCM required this visit?  No   Plan:    I have personally reviewed and noted the following in the patient's  chart:   Medical and social history Use of alcohol, tobacco or illicit drugs  Current medications and supplements including opioid prescriptions. Patient is not currently taking opioid prescriptions. Functional ability and status Nutritional status Physical activity Advanced directives List of other physicians Hospitalizations, surgeries, and ER visits in previous 12 months Vitals Screenings to include cognitive, depression, and falls Referrals and appointments  In addition, I have reviewed and discussed with patient certain preventive protocols, quality metrics, and best practice recommendations. A written personalized care plan for preventive services as well as general preventive health recommendations were provided to patient.   Seabron Cypress [place], [place]   04/02/5283   After Visit Summary: (MyChart) Due to this being a telephonic visit, the after visit summary with patients personalized plan was offered to patient via MyChart   Notes: Nothing significant to report at this time.

## 2024-04-12 ENCOUNTER — Ambulatory Visit: Admitting: Allergy & Immunology

## 2024-04-12 ENCOUNTER — Other Ambulatory Visit: Payer: Self-pay

## 2024-04-12 ENCOUNTER — Encounter: Payer: Self-pay | Admitting: Allergy & Immunology

## 2024-04-12 VITALS — BP 122/76 | HR 62 | Temp 98.2°F | Resp 18 | Ht 61.42 in | Wt 141.6 lb

## 2024-04-12 DIAGNOSIS — L4050 Arthropathic psoriasis, unspecified: Secondary | ICD-10-CM | POA: Diagnosis not present

## 2024-04-12 DIAGNOSIS — R438 Other disturbances of smell and taste: Secondary | ICD-10-CM

## 2024-04-12 DIAGNOSIS — J310 Chronic rhinitis: Secondary | ICD-10-CM | POA: Diagnosis not present

## 2024-04-12 NOTE — Patient Instructions (Addendum)
 1. Chronic rhinitis - Because of insurance stipulations, we cannot do skin testing on the same day as your first visit. - We are all working to fight this, but for now we need to do two separate visits.  - We are going to get some labs to look for environmental allergies. - We can do skin testing if this is negative, but I am not convinced that this is even related to allergies at all.  - Once we have the blood work, we can figure out a plan for further management.   2. Psoriasis - We will send this note to Dr. Dolphus to keep her in the loop.   3. Follow up to be determined based on the labs.    Please inform us  of any Emergency Department visits, hospitalizations, or changes in symptoms. Call us  before going to the ED for breathing or allergy symptoms since we might be able to fit you in for a sick visit. Feel free to contact us  anytime with any questions, problems, or concerns.  It was a pleasure to meet you today!  Websites that have reliable patient information: 1. American Academy of Asthma, Allergy, and Immunology: www.aaaai.org 2. Food Allergy Research and Education (FARE): foodallergy.org 3. Mothers of Asthmatics: http://www.asthmacommunitynetwork.org 4. American College of Allergy, Asthma, and Immunology: www.acaai.org      "Like" us  on Facebook and Instagram for our latest updates!      A healthy democracy works best when [employer] participate! Make sure you are registered to vote! If you have moved or changed any of your contact information, you will need to get this updated before voting! Scan the QR codes below to learn more!

## 2024-04-12 NOTE — Progress Notes (Deleted)
 NEW PATIENT  Date of Service/Encounter:  04/12/24  Consult requested by: Alphonsa Glendia LABOR, MD   Assessment:   No diagnosis found.  Plan/Recommendations:   There are no Patient Instructions on file for this visit.   {Blank single:19197::This note in its entirety was forwarded to the Provider who requested this consultation.}  Subjective:   [name] is a 78 y.o. female presenting today for evaluation of No chief complaint on file.   [name] has a history of the following: Patient Active Problem List   Diagnosis Date Noted   Generalized weakness 03/16/2022   Bilateral high frequency sensorineural hearing loss 06/28/2019   History of hyperlipidemia 02/09/2017   Other psoriasis 12/30/2016   High risk medications (not anticoagulants) long-term use 12/30/2016   Psoriatic arthritis (HCC) 12/27/2014   Vitamin D  deficiency 08/06/2013   Osteopenia 08/06/2013   Insomnia 02/22/2013   Hyperlipidemia 01/09/2013   Osteoarthritis of shoulder 09/15/2010    History obtained from: chart review and {Persons; PED relatives w/patient:19415::patient}.  Discussed the use of AI scribe software for clinical note transcription with the patient and/or guardian, who gave verbal consent to proceed.  [name] was referred by Alphonsa Glendia LABOR, MD.     [name] is a 78 y.o. female presenting for {Blank single:19197::a food challenge,a drug challenge,skin testing,a sick visit,an evaluation of ***,a follow up visit}.    Asthma/Respiratory Symptom History: ***  Allergic Rhinitis Symptom History: ***  Food Allergy Symptom History: ***  Skin Symptom History: ***  GERD Symptom History: ***  Infection Symptom History: ***  ***Otherwise, there is no history of other atopic diseases, including {Blank multiple:19196:o:asthma,food allergies,drug allergies,environmental allergies,stinging insect allergies,eczema,urticaria,contact  dermatitis}. There is no significant infectious history. ***Vaccinations are up to date.    Past Medical History: Patient Active Problem List   Diagnosis Date Noted   Generalized weakness 03/16/2022   Bilateral high frequency sensorineural hearing loss 06/28/2019   History of hyperlipidemia 02/09/2017   Other psoriasis 12/30/2016   High risk medications (not anticoagulants) long-term use 12/30/2016   Psoriatic arthritis (HCC) 12/27/2014   Vitamin D  deficiency 08/06/2013   Osteopenia 08/06/2013   Insomnia 02/22/2013   Hyperlipidemia 01/09/2013   Osteoarthritis of shoulder 09/15/2010    Medication List:  Allergies as of 04/12/2024       Reactions   Etodolac Other (See Comments)   REACTION: chest pain and nausea   Acetic Acid Itching   OVER CONSUMPTION OF ACIDS CAUSES RASH        Medication List        Accurate as of April 12, 2024 10:33 AM. If you have any questions, ask your nurse or doctor.          fluticasone  50 MCG/ACT nasal spray Commonly known as: FLONASE  Place 2 sprays into both nostrils 2 (two) times daily.   levocetirizine 5 MG tablet Commonly known as: Xyzal  Allergy 24HR Take 1 tablet (5 mg total) by mouth every evening.   Otezla  30 MG Tabs Generic drug: Apremilast  Take 1 tablet (30 mg total) by mouth 2 (two) times daily.   rosuvastatin  5 MG tablet Commonly known as: CRESTOR  Take 1 tablet (5 mg total) by mouth daily.   Vitamin D3 50 MCG (2000 UT) Tabs Take 2,000 Units by mouth every evening.        Birth History: {Blank single:19197::non-contributory,born premature and spent time in the NICU,born at term without complications}  Developmental History: Stanley has met all milestones on time. She has required  no {Blank multiple:19196:a:speech therapy,occupational therapy,physical therapy}. ***non-contributory  Past Surgical History: Past Surgical History:  Procedure Laterality Date   ABDOMINAL HYSTERECTOMY     COLONOSCOPY   08/25/2011   Procedure: COLONOSCOPY;  Surgeon: Claudis RAYMOND Rivet, MD;  [facility];  Service: Endoscopy;  Laterality: N/A;  12:00   COLONOSCOPY N/A 09/21/2018   Procedure: COLONOSCOPY;  Surgeon: Rivet Claudis RAYMOND, MD;  [facility];  Service: Endoscopy;  Laterality: N/A;  830   left knee stable out  10/04/1964   left knee staple in  10/04/1960   VESICOVAGINAL FISTULA CLOSURE W/ TAH  10/04/1984     Family History: Family History  Problem Relation Age of Onset   Anesthesia problems Mother    Cancer Mother    Hypertension Mother    Breast cancer Mother    Arthritis Mother    Obesity Mother    Cancer Father    Diabetes Father    Deep vein thrombosis Father    Hypertension Father    Hypertension Sister    Diabetes Sister    Hearing loss Sister    Obesity Sister    Kidney failure Sister    Breast cancer Sister    Migraines Sister    Cancer Sister    Migraines Sister    Cancer Sister    Breast cancer Sister    Hypertension Brother    Heart disease Brother    Cancer Brother    Obesity Brother    Early death Brother    Heart Problems Brother        Leaky valve   Alcohol abuse Maternal Uncle    Arthritis Other        FH   Diabetes Other        FH   Cancer Other        FH   Hypotension Neg Hx    Malignant hyperthermia Neg Hx    Pseudochol deficiency Neg Hx      Social History: Crosby lives at home with ***.    Review of systems otherwise negative other than that mentioned in the HPI.    Objective:   There were no vitals taken for this visit. There is no height or weight on file to calculate BMI.     Physical Exam   Diagnostic studies: {Blank single:19197::none,deferred due to recent antihistamine use,deferred due to insurance stipulations that require a separate visit for testing,labs sent instead, }  Spirometry: {Blank single:19197::results normal (FEV1: ***%, FVC: ***%, FEV1/FVC: ***%),results abnormal (FEV1: ***%,  FVC: ***%, FEV1/FVC: ***%)}.    {Blank single:19197::Spirometry consistent with mild obstructive disease,Spirometry consistent with moderate obstructive disease,Spirometry consistent with severe obstructive disease,Spirometry consistent with possible restrictive disease,Spirometry consistent with mixed obstructive and restrictive disease,Spirometry uninterpretable due to technique,Spirometry consistent with normal pattern}. {Blank single:19197::Albuterol /Atrovent nebulizer,Xopenex/Atrovent nebulizer,Albuterol  nebulizer,Albuterol  four puffs via MDI,Xopenex four puffs via MDI} treatment given in clinic with {Blank single:19197::significant improvement in FEV1 per ATS criteria,significant improvement in FVC per ATS criteria,significant improvement in FEV1 and FVC per ATS criteria,improvement in FEV1, but not significant per ATS criteria,improvement in FVC, but not significant per ATS criteria,improvement in FEV1 and FVC, but not significant per ATS criteria,no improvement}.  Allergy Studies: {Blank single:19197::none,deferred due to recent antihistamine use,deferred due to insurance stipulations that require a separate visit for testing,labs sent instead, }    {Blank single:19197::Allergy testing results were read and interpreted by myself, documented by clinical staff., }         Marty Shaggy, MD [facility] 

## 2024-04-12 NOTE — Progress Notes (Signed)
 NEW PATIENT  Date of Service/Encounter:  04/12/24  Consult requested by: [name] LABOR, MD   Assessment:   Chronic rhinitis - getting blood work (no atopic history, so I doubt that this will be positive)  Psoriatic arthritis - followed by Dr. Dolphus   We are going to get some environmental allergy testing via the blood.  We did discuss skin testing, but she does not really think this is necessary given the fact that she has never had evidence of allergies.  I think this is reasonable, so we will contact her after the testing is done and make a decision from there.  I do not see an indication for starting any kind of biologic, so we do not even discuss that today.  Plan/Recommendations:   1. Chronic rhinitis - Because of insurance stipulations, we cannot do skin testing on the same day as your first visit. - We are all working to fight this, but for now we need to do two separate visits.  - We are going to get some labs to look for environmental allergies. - We can do skin testing if this is negative, but I am not convinced that this is even related to allergies at all.  - Once we have the blood work, we can figure out a plan for further management.   2. Psoriasis - We will send this note to Dr. Dolphus to keep her in the loop.   3. Follow up to be determined based on the labs.    This note in its entirety was forwarded to the Provider who requested this consultation.  Subjective:   [name] is a 78 y.o. female presenting today for  Chief Complaint  Patient presents with   Establish Care    Pt reports that she does not have a sense of smell. Went to ENT and was referred here from there.     [name] has a history of the following: Patient Active Problem List   Diagnosis Date Noted   Generalized weakness 03/16/2022   Bilateral high frequency sensorineural hearing loss 06/28/2019   History of hyperlipidemia 02/09/2017   Other psoriasis  12/30/2016   High risk medications (not anticoagulants) long-term use 12/30/2016   Psoriatic arthritis (HCC) 12/27/2014   Vitamin D  deficiency 08/06/2013   Osteopenia 08/06/2013   Insomnia 02/22/2013   Hyperlipidemia 01/09/2013   Osteoarthritis of shoulder 09/15/2010    History obtained from: chart review and patient.  Discussed the use of AI scribe software for clinical note transcription with the patient and/or guardian, who gave verbal consent to proceed.  [name] was referred by [name] LABOR, MD.     [name] is a 78 y.o. female presenting for an evaluation of lack of smell for 5 years. She states that it has been about 5 years since she had a full sense of smell. Her taste is not decreased. Rarely, she is able to smell one thing, such as a strong perfume or food, but never has periods where her smell is generally improved. She notes that about 5 years ago, she had used some Vick's salve for intermittent one-sided nasal congestion at night that cycled from side to side. She wiped vick's under the affected nose side enough to use about 1.5 bottles begore she noticed her smell was decreased. Since then, her smell has not returned. She us  not able to identify any specific fragrance that she can consistently smell.  She did undergo a nasal endoscopy  in May 2025 with Dr. Soldatova.  At that time, she found that the nasal mucosa, turbinates, septum, and sinus drainage pathways were visualized and normal without purulence.  There were no polyps.  Mucosa was intact.  There was no crusting.  She was started on Xyzal  5 mg daily and Flonase .  She was referred for allergy testing.  For her loss of sense of smell, olfactory training was recommended.   Allergic Rhinitis Symptom History: No history of rhinitis or consistent congestive symptoms  Skin Symptom History: Psoriasis 2/2 psoriatic arthritis. She sees Dr. Dolphus.   Infection Symptom History: No recurrent sinusitis, rhinitis  infections  Otherwise, there is no history of other atopic diseases, including asthma, food allergies, drug allergies, environmental allergies, stinging insect allergies, eczema, urticaria, or contact dermatitis. There is no significant infectious history.   Past Medical History: Patient Active Problem List   Diagnosis Date Noted   Generalized weakness 03/16/2022   Bilateral high frequency sensorineural hearing loss 06/28/2019   History of hyperlipidemia 02/09/2017   Other psoriasis 12/30/2016   High risk medications (not anticoagulants) long-term use 12/30/2016   Psoriatic arthritis (HCC) 12/27/2014   Vitamin D  deficiency 08/06/2013   Osteopenia 08/06/2013   Insomnia 02/22/2013   Hyperlipidemia 01/09/2013   Osteoarthritis of shoulder 09/15/2010    Medication List:  Allergies as of 04/12/2024       Reactions   Etodolac Other (See Comments)   REACTION: chest pain and nausea   Acetic Acid Itching   OVER CONSUMPTION OF ACIDS CAUSES RASH        Medication List        Accurate as of April 12, 2024  4:51 PM. If you have any questions, ask your nurse or doctor.          fluticasone  50 MCG/ACT nasal spray Commonly known as: FLONASE  Place 2 sprays into both nostrils 2 (two) times daily.   levocetirizine 5 MG tablet Commonly known as: Xyzal  Allergy 24HR Take 1 tablet (5 mg total) by mouth every evening.   Otezla  30 MG Tabs Generic drug: Apremilast  Take 1 tablet (30 mg total) by mouth 2 (two) times daily.   rosuvastatin  5 MG tablet Commonly known as: CRESTOR  Take 1 tablet (5 mg total) by mouth daily.   Vitamin D3 50 MCG (2000 UT) Tabs Take 2,000 Units by mouth every evening.        Past Surgical History: Past Surgical History:  Procedure Laterality Date   ABDOMINAL HYSTERECTOMY     COLONOSCOPY  08/25/2011   Procedure: COLONOSCOPY;  Surgeon: Claudis RAYMOND Rivet, MD;  [facility];  Service: Endoscopy;  Laterality: N/A;  12:00   COLONOSCOPY N/A  09/21/2018   Procedure: COLONOSCOPY;  Surgeon: Rivet Claudis RAYMOND, MD;  Location: [facility];  Service: Endoscopy;  Laterality: N/A;  830   left knee stable out  10/04/1964   left knee staple in  10/04/1960   VESICOVAGINAL FISTULA CLOSURE W/ TAH  10/04/1984     Family History: Family History  Problem Relation Age of Onset   Anesthesia problems Mother    Cancer Mother    Hypertension Mother    Breast cancer Mother    Arthritis Mother    Obesity Mother    Cancer Father    Diabetes Father    Deep vein thrombosis Father    Hypertension Father    Hypertension Sister    Diabetes Sister    Hearing loss Sister    Obesity Sister  Kidney failure Sister    Breast cancer Sister    Migraines Sister    Cancer Sister    Migraines Sister    Cancer Sister    Breast cancer Sister    Hypertension Brother    Heart disease Brother    Cancer Brother    Obesity Brother    Early death Brother    Heart Problems Brother        Leaky valve   Alcohol abuse Maternal Uncle    Arthritis Other        FH   Diabetes Other        FH   Cancer Other        FH   Hypotension Neg Hx    Malignant hyperthermia Neg Hx    Pseudochol deficiency Neg Hx     Social History: Catlyn lives at home.  She lives in a house that was built in 1972.  There is carpeting in the main living areas and hardwood in the bedroom.  She has a heat pump for heating and cooling.  There are no animals inside or outside of the home.  There are no dust mite covers on the bedding.  There is no exposure to fumes, chemicals, or dust.  There is no tobacco exposure.  She is currently retired.  There is no HEPA filter in her home.  She does not live near an interstate or industrial area.   Review of systems otherwise negative other than that mentioned in the HPI.    Objective:   Blood pressure 122/76, pulse 62, temperature 98.2 F (36.8 C), temperature source Temporal, resp. rate 18, height 5' 1.42 (1.56 m), weight 141 lb 9.6  oz (64.2 kg), SpO2 100%. Body mass index is 26.39 kg/m.     Physical Exam Constitutional:      Appearance: Normal appearance.  HENT:     Head: Normocephalic and atraumatic.     Right Ear: Tympanic membrane normal.     Left Ear: Tympanic membrane normal.     Ears:     Comments: Grossly normal hearing    Nose: Nose normal.     Comments: No grossly visible obstruction, polyps, or turbinate hypertrophy. Normal mucosa    Mouth/Throat:     Mouth: Mucous membranes are moist.  Eyes:     Comments: Eye movements grossly intact  Cardiovascular:     Rate and Rhythm: Normal rate and regular rhythm.  Pulmonary:     Effort: Pulmonary effort is normal.     Breath sounds: Normal breath sounds.  Abdominal:     Palpations: Abdomen is soft.  Musculoskeletal:        General: Normal range of motion.     Cervical back: Normal range of motion.  Skin:    General: Skin is warm.  Neurological:     General: No focal deficit present.     Mental Status: She is alert.     Gait: Gait normal.  Psychiatric:        Mood and Affect: Mood normal.        Behavior: Behavior normal.      Allergy Studies: to be obtained next visit   Donnice Mutter, MS4 [facility]   Marty Shaggy, MD [facility] 

## 2024-04-15 LAB — ALLERGENS W/COMP RFLX AREA 2

## 2024-04-17 ENCOUNTER — Ambulatory Visit: Payer: Self-pay | Admitting: Allergy & Immunology

## 2024-04-17 LAB — ALLERGENS W/COMP RFLX AREA 2
Bermuda Grass IgE: <0.1 kU/L
Cockroach, German IgE: <0.1 kU/L
Cottonwood IgE: <0.1 kU/L
D Farinae IgE: <0.1 kU/L
D Pteronyssinus IgE: <0.1 kU/L
E001-IgE Cat Dander: 0.38 kU/L — AB
E094-IgE Fel d 1: 0.39 kU/L — AB
E101-IgE Can f 1: <0.1 kU/L
E220-IgE Fel d 2: <0.1 kU/L
E221-IgE Can f 3: <0.1 kU/L
E226-IgE Can f 5: <0.1 kU/L
Elm, American IgE: <0.1 kU/L
IgE (Immunoglobulin E), Serum: 123 [IU]/mL (ref 6–495)
Johnson Grass IgE: <0.1 kU/L
Johnson Grass IgE: <0.1 kU/L
Mouse Urine IgE: <0.1 kU/L
Pecan, Hickory IgE: <0.1 kU/L
Penicillium Chrysogen IgE: <0.1 kU/L
Penicillium Chrysogen IgE: <0.1 kU/L
Pigweed, Rough IgE: <0.1 kU/L
Ragweed, Short IgE: <0.1 kU/L
Sheep Sorrel IgE Qn: <0.1 kU/L
Timothy Grass IgE: <0.1 kU/L
Timothy Grass IgE: <0.1 kU/L
White Mulberry IgE: <0.1 kU/L

## 2024-04-17 LAB — PANEL 606648
E101-IgE Can f 1: <0.1 kU/L
E102-IgE Can f 2: <0.1 kU/L
E221-IgE Can f 3: <0.1 kU/L
E226-IgE Can f 5: <0.1 kU/L

## 2024-04-17 LAB — PANEL 606578
E094-IgE Fel d 1: 0.19 kU/L — AB
E220-IgE Fel d 2: <0.1 kU/L
E228-IgE Fel d 4: <0.1 kU/L

## 2024-04-17 LAB — ALLERGEN COMPONENT COMMENTS

## 2024-06-06 ENCOUNTER — Telehealth: Payer: Self-pay | Admitting: *Deleted

## 2024-06-06 DIAGNOSIS — D509 Iron deficiency anemia, unspecified: Secondary | ICD-10-CM

## 2024-06-06 DIAGNOSIS — Z79899 Other long term (current) drug therapy: Secondary | ICD-10-CM

## 2024-06-06 NOTE — Telephone Encounter (Signed)
 Lipid, liver, metabolic 7, CBC, TIBC, ferritin  Microcytic anemia, hyperlipidemia, high risk med  Please order, please notify patient

## 2024-06-06 NOTE — Telephone Encounter (Signed)
 Copied from CRM #[id number]. Topic: Clinical - Request for Lab/Test Order >> Jun 06, 2024  2:25 PM Wess RAMAN wrote: Reason for CRM: Patient would like her lab order put in for tomorrow. She has her in person appt on 9/10  Callback #: [phone]

## 2024-06-06 NOTE — Telephone Encounter (Signed)
 Orders placed.

## 2024-06-13 ENCOUNTER — Ambulatory Visit (INDEPENDENT_AMBULATORY_CARE_PROVIDER_SITE_OTHER): Admitting: Family Medicine

## 2024-06-13 ENCOUNTER — Encounter: Payer: Self-pay | Admitting: Family Medicine

## 2024-06-13 VITALS — BP 129/78 | HR 67 | Temp 97.5°F | Ht 61.42 in | Wt 142.0 lb

## 2024-06-13 DIAGNOSIS — Z79899 Other long term (current) drug therapy: Secondary | ICD-10-CM | POA: Diagnosis not present

## 2024-06-13 DIAGNOSIS — E785 Hyperlipidemia, unspecified: Secondary | ICD-10-CM

## 2024-06-13 DIAGNOSIS — D509 Iron deficiency anemia, unspecified: Secondary | ICD-10-CM

## 2024-06-13 DIAGNOSIS — L4050 Arthropathic psoriasis, unspecified: Secondary | ICD-10-CM | POA: Diagnosis not present

## 2024-06-13 DIAGNOSIS — E559 Vitamin D deficiency, unspecified: Secondary | ICD-10-CM | POA: Diagnosis not present

## 2024-06-13 NOTE — Progress Notes (Signed)
   Subjective:    Patient ID: [name], female    DOB: 1946/08/16, 78 y.o.   MRN: [id number]  HPI 6 month f/u  Hyperlipidemia-taking her medicine tolerating the medicine well  Psoriasis-only using Otezla  once per day we did discuss this   Cough and congestion x's 5 days-cough congestion symptoms over the past several days denies high fever chills sweats denies wheezing difficulty breathing denies sinus pain energy level fair    Review of Systems     Objective:   Physical Exam  General-in no acute distress Eyes-no discharge Lungs-respiratory rate normal, CTA CV-no murmurs,RRR Extremities skin warm dry no edema Neuro grossly normal Behavior normal, alert Psoriasis noted      Assessment & Plan:  1. Vitamin D  deficiency (Primary) Patient has history of vitamin D  deficiency she request checking this we will do lab work - VITAMIN D  25 Hydroxy (Vit-D Deficiency, Fractures)  2. Psoriatic arthritis (HCC) She does take the Otezla  but only once per day there is no real particular reason why she is only doing once a day she is open to doing twice a day she will initiate twice per day  3. Iron deficiency anemia, unspecified iron deficiency anemia type Previous lab work looked good.  Additional labs were ordered several days ago she will do these in the near future  4. High risk medication use She will do labs as ordered previously  Lab work was just ordered a few days ago she will get this completed in the next several days we will look at the results and send her notification continue current medicines for now increase dose of Otezla  to twice a day as discussed  Follow-up 6 months

## 2024-06-18 ENCOUNTER — Other Ambulatory Visit: Payer: Self-pay | Admitting: *Deleted

## 2024-06-18 DIAGNOSIS — D509 Iron deficiency anemia, unspecified: Secondary | ICD-10-CM

## 2024-06-18 DIAGNOSIS — Z79899 Other long term (current) drug therapy: Secondary | ICD-10-CM

## 2024-06-19 ENCOUNTER — Ambulatory Visit: Payer: Self-pay | Admitting: Family Medicine

## 2024-06-19 LAB — HEPATIC FUNCTION PANEL
ALT: 15 IU/L (ref 0–32)
AST: 30 IU/L (ref 0–40)
Albumin: 4.6 g/dL (ref 3.8–4.8)
Alkaline Phosphatase: 115 IU/L (ref 49–135)
Bilirubin Total: 0.4 mg/dL (ref 0.0–1.2)
Bilirubin, Direct: 0.15 mg/dL (ref 0.00–0.40)
Total Protein: 7.4 g/dL (ref 6.0–8.5)

## 2024-06-19 LAB — CBC WITH DIFFERENTIAL/PLATELET
Basophils Absolute: 0 x10E3/uL (ref 0.0–0.2)
Basos: 1 %
EOS (ABSOLUTE): 0 x10E3/uL (ref 0.0–0.4)
Eos: 0 %
Hemoglobin: 12.9 g/dL (ref 11.1–15.9)
Immature Grans (Abs): 0 x10E3/uL (ref 0.0–0.1)
Immature Granulocytes: 0 %
Lymphocytes Absolute: 3.5 x10E3/uL — ABNORMAL HIGH (ref 0.7–3.1)
Lymphs: 50 %
MCH: 24.3 pg — ABNORMAL LOW (ref 26.6–33.0)
MCHC: 32.7 g/dL (ref 31.5–35.7)
MCV: 75 fL — ABNORMAL LOW (ref 79–97)
Monocytes Absolute: 0.5 x10E3/uL (ref 0.1–0.9)
Monocytes: 7 %
Neutrophils Absolute: 2.9 x10E3/uL (ref 1.4–7.0)
Neutrophils: 42 %
Platelets: 257 x10E3/uL (ref 150–450)
RBC: 5.3 x10E6/uL — ABNORMAL HIGH (ref 3.77–5.28)
RDW: 15.7 % — ABNORMAL HIGH (ref 11.7–15.4)
WBC: 6.9 x10E3/uL (ref 3.4–10.8)

## 2024-06-19 LAB — LIPID PANEL
Chol/HDL Ratio: 2.3 ratio (ref 0.0–4.4)
Cholesterol, Total: 175 mg/dL (ref 100–199)
HDL: 75 mg/dL (ref >39)
LDL Chol Calc (NIH): 88 mg/dL (ref 0–99)
Triglycerides: 63 mg/dL (ref 0–149)
VLDL Cholesterol Cal: 12 mg/dL (ref 5–40)

## 2024-06-19 LAB — ANEMIA PANEL
Ferritin: 85 ng/mL (ref 15–150)
Folate, Hemolysate: 402 ng/mL
Folate, RBC: 1018 ng/mL (ref >498)
Hematocrit: 39.5 % (ref 34.0–46.6)
Iron Saturation: 38 % (ref 15–55)
Iron: 146 ug/dL — ABNORMAL HIGH (ref 27–139)
Retic Ct Pct: 1.2 % (ref 0.6–2.6)
Total Iron Binding Capacity: 382 ug/dL (ref 250–450)
UIBC: 236 ug/dL (ref 118–369)
Vitamin B-12: 1107 pg/mL (ref 232–1245)

## 2024-06-19 LAB — BASIC METABOLIC PANEL WITH GFR
BUN/Creatinine Ratio: 9 — ABNORMAL LOW (ref 12–28)
BUN: 10 mg/dL (ref 8–27)
CO2: 20 mmol/L (ref 20–29)
Calcium: 10.2 mg/dL (ref 8.7–10.3)
Chloride: 102 mmol/L (ref 96–106)
Creatinine, Ser: 1.13 mg/dL — ABNORMAL HIGH (ref 0.57–1.00)
Glucose: 80 mg/dL (ref 70–99)
Potassium: 5.1 mmol/L (ref 3.5–5.2)
Sodium: 141 mmol/L (ref 134–144)
eGFR: 50 mL/min/1.73 — ABNORMAL LOW (ref >59)

## 2024-06-19 LAB — VITAMIN D 25 HYDROXY (VIT D DEFICIENCY, FRACTURES): Vit D, 25-Hydroxy: 51.6 ng/mL (ref 30.0–100.0)

## 2024-06-20 ENCOUNTER — Encounter: Payer: Self-pay | Admitting: Family Medicine

## 2024-06-20 DIAGNOSIS — R7989 Other specified abnormal findings of blood chemistry: Secondary | ICD-10-CM

## 2024-06-20 DIAGNOSIS — D509 Iron deficiency anemia, unspecified: Secondary | ICD-10-CM

## 2024-06-22 ENCOUNTER — Encounter: Payer: Self-pay | Admitting: *Deleted

## 2024-06-22 NOTE — Telephone Encounter (Signed)
 Nurses Please assist with the following 1.  Referral to hematology-microcytosis diagnosis #2-please order metabolic 7 and urine micro protein-reason elevated serum creatinine  May also pass the following message to Dorothyann Erskin Dorothyann  I was out of the office for multiple days when your lab results came back-now that I am back in town I am able to give a more thorough discussion with you regarding the results  Part of the reason that I checked a more test regarding your iron levels was your MCV was low-this can sometimes be seen with iron deficiency.  Your saturation of the TIBC was in the normal range but the lower end of normal your iron was elevated but your ferritin which is reflective of your storage of iron was in the low range of normal.  It should be noted that iron levels can be elevated due to inflammation which can go along with arthritis that you are dealing with.  Nonetheless I think it is reasonable to get a hematology consult to get their input regarding this.  As for the serum creatinine this can certainly be reflective of not drinking enough liquids-I would not recommend over hydrating with water  but it is reasonable to consume enough water  so that when you are urinating during the day it is a light yellow color.  Typically just drinking water  with your meals would give you a decent amount of water .  Occasionally serum creatinine does go up as we get older which can be a reflection of her kidneys slowing down.  By no means is your results within a dangerous range.  More than likely on follow-up tests it will look better.  I would like for you to blood tests to relook at your kidney function along with a urine test to check for protein-I would recommend that you do these test somewhere within the next 30 days and it would be advisable to do it midmorning or later when you are more likely to be well-hydrated rather than first thing in the morning.  You do not need to be fasting for  this test.  Our staff will put the orders in within the next several days then you should be able to go to the lab anywhere starting in the middle of next week as per above.  Should you have additional questions let me know.  Please take care-Dr. Glendia

## 2024-06-22 NOTE — Telephone Encounter (Signed)
 Blood work and referral ordered in [employer]. Dr Freda response sent to patient via my chart

## 2024-07-06 ENCOUNTER — Inpatient Hospital Stay: Attending: Hematology and Oncology | Admitting: Hematology and Oncology

## 2024-07-06 ENCOUNTER — Inpatient Hospital Stay

## 2024-07-06 VITALS — BP 134/81 | HR 58 | Temp 97.9°F | Resp 18 | Ht 62.0 in | Wt 140.0 lb

## 2024-07-06 DIAGNOSIS — R718 Other abnormality of red blood cells: Secondary | ICD-10-CM

## 2024-07-06 NOTE — Assessment & Plan Note (Addendum)
 Lab review: 09/23/2022: Hemoglobin 10.9, MCV 74.7, RDW 15 08/11/2023: Hemoglobin 12, MCV 77, RDW 15.2 06/18/2024: Hemoglobin 12.9, MCV 75, RDW 15.7, iron saturation 38%, TIBC 382, B12 1107, red cell folate 1018, ferritin 85, reticulocyte 1.2%  Microcytosis without evidence of iron deficiency anemia Most likely cause is thalassemia.  Recommendation: Hemoglobin electrophoresis Alpha thalassemia globulin analysis  I discussed with the patient extensively about the pathophysiology of thalassemia's.  Since these are inherited hemoglobinopathies, there are no specific treatments required.  Patient does not appear to have any significant anemia and hence it can be information to help explain the microcytosis but no definite interventions would be required.  Follow-up in 4 weeks to review the results of today's blood work. Patient can be followed periodically to monitor if she develops significant anemia.

## 2024-07-06 NOTE — Progress Notes (Signed)
 [facility] CONSULT NOTE  Patient Care Team: Alphonsa Glendia LABOR, MD as PCP - General (Family Medicine) Dolphus Reiter, MD as Consulting Physician (Rheumatology) Okey Burns, MD as Consulting Physician (Otolaryngology)  CHIEF COMPLAINTS/PURPOSE OF CONSULTATION:  Microcytosis evaluation  HISTORY OF PRESENTING ILLNESS:   History of Present Illness [name] is a 78 year old female with a history of microcytic anemia who presents with persistent low MCV levels. She was referred by Dr. Lippman for evaluation of abnormal blood test results.  She has experienced persistent low mean corpuscular volume (MCV) levels over the years, with recent hemoglobin improvement from 10.9 g/dL in December 2023 to 87.0 g/dL currently. Despite normal iron, B12, and folic acid  levels, microcytosis persists.  She underwent a hysterectomy in 1986 and had iron deficiency in the past, treated with liquid iron prior to surgery. She has not received iron infusions or taken iron supplements recently. Her blood donation history includes being advised against donating blood due to her condition.  Her mother had anemia, though the specific type is unclear.    I reviewed her records extensively and collaborated the history with the patient.   MEDICAL HISTORY:  Past Medical History:  Diagnosis Date   Anemia 1986   Cataract Have but not ready to be removed.   Hx of colonic polyps    Hypercholesterolemia    Psoriatic arthritis (HCC)     SURGICAL HISTORY: Past Surgical History:  Procedure Laterality Date   ABDOMINAL HYSTERECTOMY     COLONOSCOPY  08/25/2011   Procedure: COLONOSCOPY;  Surgeon: Claudis RAYMOND Rivet, MD;  [facility];  Service: Endoscopy;  Laterality: N/A;  12:00   COLONOSCOPY N/A 09/21/2018   Procedure: COLONOSCOPY;  Surgeon: Rivet Claudis RAYMOND, MD;  [facility];  Service: Endoscopy;  Laterality: N/A;  830   left knee stable out  10/04/1964   left knee  staple in  10/04/1960   VESICOVAGINAL FISTULA CLOSURE W/ TAH  10/04/1984    SOCIAL HISTORY: Social History   Socioeconomic History   Marital status: Widowed    Spouse name: Not on file   Number of children: 0   Years of education: Not on file   Highest education level: Associate degree: academic program  Occupational History   Not on file  Tobacco Use   Smoking status: Never    Passive exposure: Past   Smokeless tobacco: Never   Tobacco comments:    Never smoked.  Vaping Use   Vaping status: Never Used  Substance and Sexual Activity   Alcohol use: No   Drug use: No   Sexual activity: Not Currently    Birth control/protection: Abstinence, None  Other Topics Concern   Not on file  Social History Narrative   Retired, Airline pilot.   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   No biological children, but has fostered many.   Several siblings living.    Social Drivers of Corporate investment banker Strain: Low Risk  (03/19/2024)   Overall Financial Resource Strain (CARDIA)    Difficulty of Paying Living Expenses: Not hard at all  Food Insecurity: No Food Insecurity (03/19/2024)   Hunger Vital Sign    Worried About Running Out of Food in the Last Year: Never true    Ran Out of Food in the Last Year: Never true  Transportation Needs: No Transportation Needs (03/19/2024)   PRAPARE - Administrator, Civil Service (Medical): No    Lack of Transportation (Non-Medical): No  Physical Activity: Insufficiently Active (03/19/2024)   Exercise Vital Sign    Days of Exercise per Week: 3 days    Minutes of Exercise per Session: 40 min  Stress: No Stress Concern Present (03/19/2024)   [employer] of Occupational Health - Occupational Stress Questionnaire    Feeling of Stress: Only a little  Social Connections: Moderately Integrated (03/19/2024)   Social Connection and Isolation Panel    Frequency of Communication with Friends and Family: Three times a week    Frequency of Social  Gatherings with Friends and Family: Three times a week    Attends Religious Services: More than 4 times per year    Active Member of Clubs or Organizations: Yes    Attends Banker Meetings: 1 to 4 times per year    Marital Status: Widowed  Intimate Partner Violence: Not At Risk (03/23/2024)   Humiliation, Afraid, Rape, and Kick questionnaire    Fear of Current or Ex-Partner: No    Emotionally Abused: No    Physically Abused: No    Sexually Abused: No    FAMILY HISTORY: Family History  Problem Relation Age of Onset   Anesthesia problems Mother    Cancer Mother    Hypertension Mother    Breast cancer Mother    Arthritis Mother    Obesity Mother    Cancer Father    Diabetes Father    Deep vein thrombosis Father    Hypertension Father    Hypertension Sister    Diabetes Sister    Hearing loss Sister    Obesity Sister    Kidney failure Sister    Breast cancer Sister    Migraines Sister    Cancer Sister    Migraines Sister    Cancer Sister    Breast cancer Sister    Hypertension Brother    Heart disease Brother    Cancer Brother    Obesity Brother    Early death Brother    Heart Problems Brother        Leaky valve   Alcohol abuse Maternal Uncle    Arthritis Other        FH   Diabetes Other        FH   Cancer Other        FH   Hypotension Neg Hx    Malignant hyperthermia Neg Hx    Pseudochol deficiency Neg Hx     ALLERGIES:  is allergic to etodolac and acetic acid.  MEDICATIONS:  Current Outpatient Medications  Medication Sig Dispense Refill   Apremilast  (OTEZLA ) 30 MG TABS Take 1 tablet (30 mg total) by mouth 2 (two) times daily. 180 tablet 0   Cholecalciferol (VITAMIN D3) 50 MCG (2000 UT) TABS Take 2,000 Units by mouth every evening.     rosuvastatin  (CRESTOR ) 5 MG tablet Take 1 tablet (5 mg total) by mouth daily. 90 tablet 3   No current facility-administered medications for this visit.    REVIEW OF SYSTEMS:   Constitutional: Denies fevers,  chills or abnormal night sweats All other systems were reviewed with the patient and are negative.  PHYSICAL EXAMINATION: ECOG PERFORMANCE STATUS: 1 - Symptomatic but completely ambulatory  Vitals:   07/06/24 1440  BP: 134/81  Pulse: (!) 58  Resp: 18  Temp: 97.9 F (36.6 C)  SpO2: 98%   Filed Weights   07/06/24 1440  Weight: 140 lb (63.5 kg)    GENERAL:alert, no distress and comfortable  LABORATORY DATA:  I have  reviewed the data as listed Lab Results  Component Value Date   WBC 6.9 06/18/2024   HGB 12.9 06/18/2024   HCT 39.5 06/18/2024   MCV 75 (L) 06/18/2024   PLT 257 06/18/2024   Lab Results  Component Value Date   NA 141 06/18/2024   K 5.1 06/18/2024   CL 102 06/18/2024   CO2 20 06/18/2024    RADIOGRAPHIC STUDIES: I have personally reviewed the radiological reports and agreed with the findings in the report.  ASSESSMENT AND PLAN:  Microcytosis Lab review: 09/23/2022: Hemoglobin 10.9, MCV 74.7, RDW 15 08/11/2023: Hemoglobin 12, MCV 77, RDW 15.2 06/18/2024: Hemoglobin 12.9, MCV 75, RDW 15.7, iron saturation 38%, TIBC 382, B12 1107, red cell folate 1018, ferritin 85, reticulocyte 1.2%  Microcytosis without evidence of iron deficiency anemia Most likely cause is thalassemia.  Recommendation: Hemoglobin electrophoresis Alpha thalassemia globulin analysis  I discussed with the patient extensively about the pathophysiology of thalassemia's.  Since these are inherited hemoglobinopathies, there are no specific treatments required.  Patient does not appear to have any significant anemia and hence it can be information to help explain the microcytosis but no definite interventions would be required.  Follow-up in 4 weeks to review the results of today's blood work. Patient can be followed periodically to monitor if she develops significant anemia.   All questions were answered. The patient knows to call the clinic with any problems, questions or concerns.     Viinay K Camauri Craton, MD 07/06/24

## 2024-07-09 LAB — HGB FRACTIONATION CASCADE
Hgb A2: 4.7 % — ABNORMAL HIGH (ref 1.8–3.2)
Hgb A: 94.2 % — ABNORMAL LOW (ref 96.4–98.8)
Hgb F: 1.1 % (ref 0.0–2.0)
Hgb S: 0 %

## 2024-07-11 NOTE — Progress Notes (Signed)
 Office Visit Note  Patient: [name]             Date of Birth: 1946-05-18           MRN: [id number]             PCP: Alphonsa Glendia LABOR, MD Referring: Alphonsa Glendia LABOR, MD Visit Date: 07/25/2024 Occupation: Data Unavailable  Subjective:  Medication monitoring  History of Present Illness: [name] is a 78 y.o. female with history of psoriatic arthritis.  Patient is taking otezla  30 mg 1 tablet by mouth twice daily.  She is tolerating Otezla  without any side effects and has not had any recent gaps in therapy.  She denies any signs or symptoms of a flare.  Patient has been going to water  aerobics twice a week and has been walking for exercise.  She denies any nocturnal pain, morning stiffness, or difficulty performing ADLs.  She denies any joint swelling at this time.  She denies any SI joint pain.  She denies any Achilles tendinitis or plantar fasciitis.  Patient has some psoriasis on the right elbow and uses an Otc topical agent.   Patient states that she establish care with Dr. Godina due to longstanding history of microcytic anemia.  Patient states that she has been diagnosed with thalassemia.  She will be following back up with hematology on 08/01/2024 to discuss lab results   Activities of Daily Living:  Patient reports morning stiffness for 0 minutes.   Patient Denies nocturnal pain.  Difficulty dressing/grooming: Denies Difficulty climbing stairs: Denies Difficulty getting out of chair: Denies Difficulty using hands for taps, buttons, cutlery, and/or writing: Denies  Review of Systems  Constitutional:  Negative for fatigue.  HENT:  Negative for mouth sores and mouth dryness.   Eyes:  Negative for dryness.  Respiratory:  Negative for shortness of breath.   Cardiovascular:  Negative for chest pain and palpitations.  Gastrointestinal:  Negative for blood in stool, constipation and diarrhea.  Endocrine: Negative for increased urination.  Genitourinary:   Negative for involuntary urination.  Musculoskeletal:  Negative for joint pain, gait problem, joint pain, joint swelling, myalgias, muscle weakness, morning stiffness, muscle tenderness and myalgias.  Skin:  Negative for color change, rash, hair loss and sensitivity to sunlight.  Allergic/Immunologic: Negative for susceptible to infections.  Neurological:  Negative for dizziness and headaches.  Hematological:  Negative for swollen glands.  Psychiatric/Behavioral:  Positive for sleep disturbance. Negative for depressed mood. The patient is not nervous/anxious.     PMFS History:  Patient Active Problem List   Diagnosis Date Noted   Microcytosis 07/06/2024   Generalized weakness 03/16/2022   Bilateral high frequency sensorineural hearing loss 06/28/2019   History of hyperlipidemia 02/09/2017   Other psoriasis 12/30/2016   High risk medications (not anticoagulants) long-term use 12/30/2016   Psoriatic arthritis (HCC) 12/27/2014   Vitamin D  deficiency 08/06/2013   Osteopenia 08/06/2013   Insomnia 02/22/2013   Hyperlipidemia 01/09/2013   Osteoarthritis of shoulder 09/15/2010    Past Medical History:  Diagnosis Date   Anemia 1986   Cataract Have but not ready to be removed.   Hx of colonic polyps    Hypercholesterolemia    Psoriatic arthritis (HCC)     Family History  Problem Relation Age of Onset   Anesthesia problems Mother    Cancer Mother    Hypertension Mother    Breast cancer Mother    Arthritis Mother    Obesity Mother    Cancer  Father    Diabetes Father    Deep vein thrombosis Father    Hypertension Father    Hypertension Sister    Diabetes Sister    Hearing loss Sister    Obesity Sister    Kidney failure Sister    Breast cancer Sister    Migraines Sister    Breast cancer Sister    Migraines Sister    Breast cancer Sister    Hypertension Brother    Heart disease Brother    Cancer Brother    Obesity Brother    Early death Brother    Heart Problems Brother         Leaky valve   Alcohol abuse Maternal Uncle    Arthritis Other        FH   Diabetes Other        FH   Cancer Other        FH   Hypotension Neg Hx    Malignant hyperthermia Neg Hx    Pseudochol deficiency Neg Hx    Past Surgical History:  Procedure Laterality Date   ABDOMINAL HYSTERECTOMY     COLONOSCOPY  08/25/2011   Procedure: COLONOSCOPY;  Surgeon: Claudis RAYMOND Rivet, MD;  Location: [facility];  Service: Endoscopy;  Laterality: N/A;  12:00   COLONOSCOPY N/A 09/21/2018   Procedure: COLONOSCOPY;  Surgeon: Rivet Claudis RAYMOND, MD;  Location: [facility];  Service: Endoscopy;  Laterality: N/A;  830   left knee stable out  10/04/1964   left knee staple in  10/04/1960   VESICOVAGINAL FISTULA CLOSURE W/ TAH  10/04/1984   Social History   Tobacco Use   Smoking status: Never    Passive exposure: Past   Smokeless tobacco: Never   Tobacco comments:    Never smoked.  Vaping Use   Vaping status: Never Used  Substance Use Topics   Alcohol use: No   Drug use: No   Social History   Social History Narrative   Retired, Airline pilot.   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   No biological children, but has fostered many.   Several siblings living.      Immunization History  Administered Date(s) Administered   Fluad Quad(high Dose 65+) 10/02/2020, 07/29/2021   Influenza,inj,Quad PF,6+ Mos 06/19/2019   Influenza-Unspecified 07/13/2016, 08/02/2018, 08/25/2022   Moderna Sars-Covid-2 Vaccination 11/28/2019, 12/26/2019, 08/26/2020, 05/15/2021   PPD Test 11/06/2019   Pneumococcal Conjugate-13 12/27/2014   Pneumococcal Polysaccharide-23 03/30/2016   Pneumococcal-Unspecified 11/06/2009   Zoster Recombinant(Shingrix) 07/07/2021, 08/25/2022   Zoster, Live 07/02/2016     Objective: Vital Signs: BP 129/76   Pulse (!) 53   Temp (!) 97 F (36.1 C)   Ht 5' 2 (1.575 m)   Wt 140 lb 3.2 oz (63.6 kg)   BMI 25.64 kg/m    Physical Exam Vitals and nursing note reviewed.  Constitutional:       Appearance: She is well-developed.  HENT:     Head: Normocephalic and atraumatic.  Eyes:     Conjunctiva/sclera: Conjunctivae normal.  Cardiovascular:     Rate and Rhythm: Normal rate and regular rhythm.     Heart sounds: Normal heart sounds.  Pulmonary:     Effort: Pulmonary effort is normal.     Breath sounds: Normal breath sounds.  Abdominal:     General: Bowel sounds are normal.     Palpations: Abdomen is soft.  Musculoskeletal:     Cervical back: Normal range of motion.  Lymphadenopathy:     Cervical: No  cervical adenopathy.  Skin:    General: Skin is warm and dry.     Capillary Refill: Capillary refill takes less than 2 seconds.     Comments: Fingernail pitting noted.  Plaque of psoriasis on extensor surface of the right elbow.   Neurological:     Mental Status: She is alert and oriented to person, place, and time.  Psychiatric:        Behavior: Behavior normal.      Musculoskeletal Exam: C-spine, thoracic spine, lumbar spine have good range of motion.  No midline spinal tenderness.  No SI joint tenderness.  Shoulder joints, elbow joints, wrist joints, MCPs, PIPs, DIPs have good range of motion with no synovitis.  Complete fist formation bilaterally.  Hip joints have good range of motion with no groin pain.  Knee joints have good range of motion no warmth or effusion.  Ankle joints have good range of motion no tenderness or joint swelling.  No evidence of Achilles tendinitis or plantar fasciitis.   CDAI Exam: CDAI Score: -- Patient Global: --; Provider Global: -- Swollen: --; Tender: -- Joint Exam 07/25/2024   No joint exam has been documented for this visit   There is currently no information documented on the homunculus. Go to the Rheumatology activity and complete the homunculus joint exam.  Investigation: No additional findings.  Imaging: No results found.  Recent Labs: Lab Results  Component Value Date   WBC 6.9 06/18/2024   HGB 12.9 06/18/2024   PLT  257 06/18/2024   NA 144 07/16/2024   K 4.3 07/16/2024   CL 107 (H) 07/16/2024   CO2 21 07/16/2024   GLUCOSE 84 07/16/2024   BUN 5 (L) 07/16/2024   CREATININE 0.98 07/16/2024   BILITOT 0.4 06/18/2024   ALKPHOS 115 06/18/2024   AST 30 06/18/2024   ALT 15 06/18/2024   PROT 7.4 06/18/2024   ALBUMIN 4.6 06/18/2024   CALCIUM  9.8 07/16/2024   GFRAA 73 08/04/2020    Speciality Comments: No specialty comments available.  Procedures:  No procedures performed Allergies: Etodolac and Acetic acid   Assessment / Plan:     Visit Diagnoses: Psoriatic arthritis (HCC): She has no synovitis or dactylitis on examination today.  No SI joint tenderness upon palpation.  No evidence of Achilles tendinitis or plantar fasciitis.  She has clinically been doing well taking Otezla  30 mg 1 tablet by mouth twice daily.  She is tolerating Otezla  without any side effects and has not had any major gaps in therapy.  Fingernail pitting was noted on exam as well as a plaque of psoriasis on the extensor surface of the right elbow.  She has been using an over-the-counter topical agent which she finds to be helpful.  She declined a prescription topical agent at this time.  She does not want to make any medication changes at this time.  Nelma has been discussed in the past. She was advised to notify us  if she develops any new or worsening symptoms.  She will follow-up in the office in 6 months or sooner if needed.  Psoriasis: Fingernail pitting noted.  Plaque psoriasis noted on the extensor surface of the right elbow.  She uses over-the-counter topical agents as needed.  She remains on Otezla  30 mg 1 tablet twice daily.  No medication changes will be made at this time.  High risk medications (not anticoagulants) long-term use - Otezla  30 mg 1 tablet by mouth twice daily.  BMP updated on 07/16/2024-creatinine 0.98 and GFR 59.  Primary osteoarthritis of left knee: She has good range of motion of the left knee joint on  examination today.  No warmth or effusion noted.  She has been going to water  aerobics twice a week and has been walking for exercise.  Osteopenia of multiple sites - DEXA on 07/26/22: The BMD measured at Forearm Radius 33% is 0.573 g/cm2 with a T-scoreof -2.0.  Due to update DEXA.  She is taking vitamin D  2000 units daily.   Vitamin D  deficiency: She is taking vitamin D  2000 units daily.   Other medical conditions are listed as follows:   Other insomnia  History of hyperlipidemia: She takes crestor  5 mg 1 tablet by mouth daily.   History of anemia: Longstanding history of microcytosis without evidence of iron deficiency anemia.  Patient has established care with Dr. Odean.  She has been diagnosed with thalassemia.  She will be following back up with hematology on 08/01/2024 to discuss recent lab results.  Orders: No orders of the defined types were placed in this encounter.  No orders of the defined types were placed in this encounter.    Follow-Up Instructions: Return in about 6 months (around 01/23/2025) for Psoriatic arthritis.   Waddell CHRISTELLA Craze, PA-C  Note - This record has been created using Dragon software.  Chart creation errors have been sought, but may not always  have been located. Such creation errors do not reflect on  the standard of medical care.

## 2024-07-16 LAB — ALPHA-THALASSEMIA ANALYSIS: IMAGE: 0

## 2024-07-17 ENCOUNTER — Encounter: Payer: Self-pay | Admitting: Family Medicine

## 2024-07-17 ENCOUNTER — Ambulatory Visit: Payer: Self-pay | Admitting: Family Medicine

## 2024-07-17 LAB — BASIC METABOLIC PANEL WITH GFR
BUN/Creatinine Ratio: 5 — ABNORMAL LOW (ref 12–28)
BUN: 5 mg/dL — ABNORMAL LOW (ref 8–27)
CO2: 21 mmol/L (ref 20–29)
Calcium: 9.8 mg/dL (ref 8.7–10.3)
Chloride: 107 mmol/L — ABNORMAL HIGH (ref 96–106)
Creatinine, Ser: 0.98 mg/dL (ref 0.57–1.00)
Glucose: 84 mg/dL (ref 70–99)
Potassium: 4.3 mmol/L (ref 3.5–5.2)
Sodium: 144 mmol/L (ref 134–144)
eGFR: 59 mL/min/1.73 — ABNORMAL LOW (ref >59)

## 2024-07-17 LAB — MICROALBUMIN / CREATININE URINE RATIO
Creatinine, Urine: 95.4 mg/dL
Microalb/Creat Ratio: 7 mg/g{creat} (ref 0–29)
Microalbumin, Urine: 6.6 ug/mL

## 2024-07-20 ENCOUNTER — Telehealth: Payer: Self-pay | Admitting: Pharmacist

## 2024-07-20 NOTE — Telephone Encounter (Signed)
 Otezla  [employer] PAP renewal application printed. Patient has OV on 07/25/24. Can complete at that visit  Sherry Pennant, PharmD, MPH, BCPS, CPP Clinical Pharmacist [facility])

## 2024-07-25 ENCOUNTER — Ambulatory Visit: Attending: Physician Assistant | Admitting: Physician Assistant

## 2024-07-25 ENCOUNTER — Encounter: Payer: Self-pay | Admitting: Physician Assistant

## 2024-07-25 VITALS — BP 129/76 | HR 53 | Temp 97.0°F | Ht 62.0 in | Wt 140.2 lb

## 2024-07-25 DIAGNOSIS — L4050 Arthropathic psoriasis, unspecified: Secondary | ICD-10-CM

## 2024-07-25 DIAGNOSIS — L409 Psoriasis, unspecified: Secondary | ICD-10-CM

## 2024-07-25 DIAGNOSIS — Z8639 Personal history of other endocrine, nutritional and metabolic disease: Secondary | ICD-10-CM

## 2024-07-25 DIAGNOSIS — E559 Vitamin D deficiency, unspecified: Secondary | ICD-10-CM

## 2024-07-25 DIAGNOSIS — Z862 Personal history of diseases of the blood and blood-forming organs and certain disorders involving the immune mechanism: Secondary | ICD-10-CM

## 2024-07-25 DIAGNOSIS — M1712 Unilateral primary osteoarthritis, left knee: Secondary | ICD-10-CM

## 2024-07-25 DIAGNOSIS — M8589 Other specified disorders of bone density and structure, multiple sites: Secondary | ICD-10-CM

## 2024-07-25 DIAGNOSIS — Z79899 Other long term (current) drug therapy: Secondary | ICD-10-CM | POA: Diagnosis not present

## 2024-07-25 DIAGNOSIS — G4709 Other insomnia: Secondary | ICD-10-CM

## 2024-07-25 NOTE — Telephone Encounter (Signed)
 Patient signed forms for Otezla . She is aware that application will need to be submitted after 08/27/24. She did not yet write down income but will call back information. Prescriber form not completed since will need to be re-done.

## 2024-08-01 ENCOUNTER — Encounter: Payer: Self-pay | Admitting: Family Medicine

## 2024-08-01 ENCOUNTER — Other Ambulatory Visit: Payer: Self-pay

## 2024-08-01 ENCOUNTER — Inpatient Hospital Stay: Admitting: Oncology

## 2024-08-01 VITALS — BP 123/57 | HR 63 | Temp 97.7°F | Resp 18 | Ht 62.0 in | Wt 140.0 lb

## 2024-08-01 DIAGNOSIS — R718 Other abnormality of red blood cells: Secondary | ICD-10-CM

## 2024-08-01 DIAGNOSIS — D563 Thalassemia minor: Secondary | ICD-10-CM | POA: Insufficient documentation

## 2024-08-01 MED ORDER — OTEZLA 30 MG PO TABS: 1.0000 | ORAL_TABLET | Freq: Two times a day (BID) | ORAL | 0 refills | Status: DC

## 2024-08-01 NOTE — Telephone Encounter (Signed)
 Refill request received via fax from [facility] for Otezla   Last Fill: 07/13/2023  Labs: 07/16/2024 BMP BUN 5 eGFR 59 BUN/Creatinine Ratio 5 Chloride 107  06/18/2024 CBC RBC 5.30 MCV 75 MCH 24.3 RDW 15.7 Lymphocytes Absolute 3.5  Next Visit: 01/24/2025  Last Visit: 07/25/2024  DX: Psoriatic arthritis (HCC)   Current Dose per office note 07/25/2024: Otezla  30 mg 1 tablet by mouth twice daily.   Okay to refill Otezla ?

## 2024-08-01 NOTE — Assessment & Plan Note (Addendum)
 Microcytosis times many years.  No iron deficiency. Workup included beta and alpha thalassemia labs revealing beta thalassemia minor.  Alpha thalassemia negative. She is essentially asymptomatic at this time.  Has occasional weakness. We discussed beta thalassemia minor and that usually patients are asymptomatic with mild anemia.  In most cases, carriers do not develop severe thalassemia or anemia.  Can include mild fatigue, slight exercise intolerance without major health issues.  Typically no treatment is required. No additional workup is needed.  As long as she remains without anemia, she may be followed by her PCP.  If she develops anemia, please come back to our clinic and we are happy to reevaluate.  Patient was in agreement.

## 2024-08-01 NOTE — Progress Notes (Signed)
 [name] OFFICE PROGRESS NOTE  [name] LABOR, MD  ASSESSMENT & PLAN:    Assessment & Plan Microcytosis Microcytosis times many years.  No iron deficiency. Workup included beta and alpha thalassemia labs revealing beta thalassemia minor.  Alpha thalassemia negative. She is essentially asymptomatic at this time.  Has occasional weakness. We discussed beta thalassemia minor and that usually patients are asymptomatic with mild anemia.  In most cases, carriers do not develop severe thalassemia or anemia.  Can include mild fatigue, slight exercise intolerance without major health issues.  Typically no treatment is required. No additional workup is needed.  As long as she remains without anemia, she may be followed by her PCP.  If she develops anemia, please come back to our clinic and we are happy to reevaluate.  Patient was in agreement.  No orders of the defined types were placed in this encounter.   INTERVAL HISTORY: [name] is a 78 year old female with a history of microcytic anemia who presents with persistent low MCV levels.   She has experienced persistent low mean corpuscular volume (MCV) levels over the years, with recent hemoglobin improvement from 10.9 g/dL in December 2023 to 87.0 g/dL currently. Despite normal iron, B12, and folic acid  levels, microcytosis persists.    She underwent a hysterectomy in 1986 and had iron deficiency in the past, treated with liquid iron prior to surgery. She has not received iron infusions or taken iron supplements recently. Her blood donation history includes being advised against donating blood due to her condition.   Her mother had anemia, though the specific type is unclear.  Patient presents today with no new concerns continues to have some insomnia but otherwise she is very stable.  Reports she used to get blood and has not given blood in over 2 years because she started to become anemic and it would take forever  to get her hemoglobin back up.  She has occasional weakness but overall feels well.   We reviewed anemia labs.   SUMMARY OF HEMATOLOGIC HISTORY: Oncology History   No history exists.     CBC    Component Value Date/Time   WBC 6.9 06/18/2024 1505   WBC 4.8 08/11/2023 0828   RBC 5.30 (H) 06/18/2024 1505   RBC 4.98 08/11/2023 0828   HGB 12.9 06/18/2024 1505   HCT 39.5 06/18/2024 1505   PLT 257 06/18/2024 1505   MCV 75 (L) 06/18/2024 1505   MCH 24.3 (L) 06/18/2024 1505   MCH 24.1 (L) 08/11/2023 0828   MCHC 32.7 06/18/2024 1505   MCHC 31.3 (L) 08/11/2023 0828   RDW 15.7 (H) 06/18/2024 1505   LYMPHSABS 3.5 (H) 06/18/2024 1505   MONOABS 0.6 12/17/2021 1759   EOSABS 0.0 06/18/2024 1505   BASOSABS 0.0 06/18/2024 1505       Latest Ref Rng & Units 07/16/2024    9:49 AM 06/18/2024    3:05 PM 08/11/2023    8:28 AM  CMP  Glucose 70 - 99 mg/dL 84  80  89   BUN 8 - 27 mg/dL 5  10  14    Creatinine 0.57 - 1.00 mg/dL 9.01  8.86  9.02   Sodium 134 - 144 mmol/L 144  141  141   Potassium 3.5 - 5.2 mmol/L 4.3  5.1  4.4   Chloride 96 - 106 mmol/L 107  102  106   CO2 20 - 29 mmol/L 21  20  26    Calcium  8.7 -  10.3 mg/dL 9.8  89.7  9.6   Total Protein 6.0 - 8.5 g/dL  7.4  6.8   Total Bilirubin 0.0 - 1.2 mg/dL  0.4  0.4   Alkaline Phos 49 - 135 IU/L  115    AST 0 - 40 IU/L  30  21   ALT 0 - 32 IU/L  15  11      Lab Results  Component Value Date   FERRITIN 85 06/18/2024   VITAMINB12 1,107 06/18/2024    Vitals:   08/01/24 1003  BP: (!) 123/57  Pulse: 63  Resp: 18  Temp: 97.7 F (36.5 C)  SpO2: 100%    Review of System:  Review of Systems  Constitutional:  Positive for malaise/fatigue.  Neurological:  Positive for weakness.    Physical Exam: Physical Exam Constitutional:      Appearance: Normal appearance.  HENT:     Head: Normocephalic and atraumatic.  Eyes:     Pupils: Pupils are equal, round, and reactive to light.  Cardiovascular:     Rate and Rhythm: Normal  rate and regular rhythm.     Heart sounds: Normal heart sounds. No murmur heard. Pulmonary:     Effort: Pulmonary effort is normal.     Breath sounds: Normal breath sounds. No wheezing.  Abdominal:     General: Bowel sounds are normal. There is no distension.     Palpations: Abdomen is soft.     Tenderness: There is no abdominal tenderness.  Musculoskeletal:        General: Normal range of motion.     Cervical back: Normal range of motion.  Skin:    General: Skin is warm and dry.     Findings: No rash.  Neurological:     Mental Status: She is alert and oriented to person, place, and time.     Gait: Gait is intact.  Psychiatric:        Mood and Affect: Mood and affect normal.        Cognition and Memory: Memory normal.        Judgment: Judgment normal.      I spent 25 minutes dedicated to the care of this patient (face-to-face and non-face-to-face) on the date of the encounter to include what is described in the assessment and plan.,  Emily Hope, NP 08/01/2024 2:19 PM

## 2024-08-27 NOTE — Telephone Encounter (Signed)
 Otezla  [employer] PAP transcribed to updated [employer] form. Prescriber form palced in Waddell Craze, PA-C's folder for signature  Submitted a Prior Authorization RENEWAL request to [facility] for OTEZLA  via CoverMyMeds. Will update once we receive a response.  Key: [id number]    Sherry Pennant, PharmD, MPH, BCPS, CPP Clinical Pharmacist [facility])

## 2024-08-27 NOTE — Telephone Encounter (Signed)
 Received notification from [facility] regarding a prior authorization for OTEZLA . Authorization has been APPROVED from 08/27/2024 to 08/27/2025. Approval letter sent to scan center.  Approval letter also placed with PAP application

## 2024-08-27 NOTE — Telephone Encounter (Signed)
 Melia from [facility] contacted the office to let us  know the patient's Otezla  30 mg is approved from 08/27/2024-08/27/2025, case number:[id number]. Melia states she is going to fax our office a copy as well.

## 2024-08-28 ENCOUNTER — Ambulatory Visit (INDEPENDENT_AMBULATORY_CARE_PROVIDER_SITE_OTHER): Admitting: Otolaryngology

## 2024-08-28 NOTE — Telephone Encounter (Signed)
 Received signed prescriber form from Waddell Craze, PA-C  Submitted Patient Assistance RENEWAL Application to [employer] for OTEZLA  with patient portion, provider portion, insurance card copy, prior authorization approval, and current medication list. Will update patient when we receive a response.  Phone #: [phone] Fax #: [phone]   Sherry Pennant, PharmD, MPH, BCPS, CPP Clinical Pharmacist [facility])

## 2024-09-05 ENCOUNTER — Ambulatory Visit (INDEPENDENT_AMBULATORY_CARE_PROVIDER_SITE_OTHER): Admitting: Otolaryngology

## 2024-09-05 ENCOUNTER — Encounter (INDEPENDENT_AMBULATORY_CARE_PROVIDER_SITE_OTHER): Payer: Self-pay | Admitting: Otolaryngology

## 2024-09-05 VITALS — BP 112/65 | HR 62 | Temp 98.1°F | Ht 62.0 in | Wt 140.0 lb

## 2024-09-05 DIAGNOSIS — J343 Hypertrophy of nasal turbinates: Secondary | ICD-10-CM

## 2024-09-05 DIAGNOSIS — J310 Chronic rhinitis: Secondary | ICD-10-CM | POA: Diagnosis not present

## 2024-09-05 DIAGNOSIS — J342 Deviated nasal septum: Secondary | ICD-10-CM | POA: Diagnosis not present

## 2024-09-05 DIAGNOSIS — R430 Anosmia: Secondary | ICD-10-CM | POA: Diagnosis not present

## 2024-09-07 ENCOUNTER — Telehealth: Payer: Self-pay | Admitting: *Deleted

## 2024-09-07 DIAGNOSIS — J342 Deviated nasal septum: Secondary | ICD-10-CM | POA: Insufficient documentation

## 2024-09-07 DIAGNOSIS — J310 Chronic rhinitis: Secondary | ICD-10-CM | POA: Insufficient documentation

## 2024-09-07 DIAGNOSIS — R430 Anosmia: Secondary | ICD-10-CM | POA: Insufficient documentation

## 2024-09-07 DIAGNOSIS — J343 Hypertrophy of nasal turbinates: Secondary | ICD-10-CM | POA: Insufficient documentation

## 2024-09-07 NOTE — Progress Notes (Signed)
 Patient ID: [name], female   DOB: August 30, 1946, 78 y.o.   MRN: [id number]  Follow up: Chronic nasal congestion, anosmia  History of Present Illness [name] is a 78 year old female who presents with persistent nasal congestion and anosmia.  She has experienced persistent nasal congestion and anosmia for several years. Despite using Flonase  nasal spray and allergy medication for the past seven months, there has been no significant improvement in her symptoms. Her sense of smell is mostly absent, although she occasionally perceives some scents.   Exam: General: Communicates without difficulty, well nourished, no acute distress. Head: Normocephalic, no evidence injury, no tenderness, facial buttresses intact without stepoff. Face/sinus: No tenderness to palpation and percussion. Facial movement is normal and symmetric. Eyes: PERRL, EOMI. No scleral icterus, conjunctivae clear. Neuro: CN II exam reveals vision grossly intact.  No nystagmus at any point of gaze. Ears: Auricles well formed without lesions.  Ear canals are intact without mass or lesion.  No erythema or edema is appreciated.  The TMs are intact without fluid. Nose: External evaluation reveals normal support and skin without lesions.  Dorsum is intact.  Anterior rhinoscopy reveals congested mucosa over anterior aspect of inferior turbinates and deviated septum.  No purulence noted. Oral:  Oral cavity and oropharynx are intact, symmetric, without erythema or edema.  Mucosa is moist without lesions. Neck: Full range of motion without pain.  There is no significant lymphadenopathy.  No masses palpable.  Thyroid bed within normal limits to palpation.  Parotid glands and submandibular glands equal bilaterally without mass.  Trachea is midline. Neuro:  CN 2-12 grossly intact.    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 Nasal cavity is  open, indicating no mechanical obstruction. Long-standing anosmia suggests poor prognosis for recovery.  - Recommended olfactory training using familiar scents such as candles, perfumes, or foods to potentially reestablish some sense of smell.  Chronic rhinitis with deviated nasal septum and bilateral inferior turbinate hypertrophy. Chronic rhinitis with mild nasal congestion and deviated septum. Nasal cavity is open, indicating no significant obstruction. Previous treatment with nasal spray and allergy medication has not significantly improved symptoms.  - Continue using steroid nasal spray to reduce turbinate swelling and manage allergies and congestion.

## 2024-09-07 NOTE — Telephone Encounter (Signed)
 Patient called the office back, stated she was told to call back and provided income information. Please advise

## 2024-09-07 NOTE — Telephone Encounter (Signed)
 Patient contacted the office and left message stating she needs to discuss her patient assistance application.   Attempted to contact the patient and left message for patient to call the office.

## 2024-09-13 NOTE — Telephone Encounter (Signed)
 Returned call to patient about this - advised that application submitted with previous year's income information. She states this is close to what she made this year. Has not yet heard from [employer] but will take additional weeks due to it being busiest time of renewal season for her  Sherry Pennant, PharmD, MPH, BCPS, CPP Clinical Pharmacist

## 2024-10-01 ENCOUNTER — Encounter: Payer: Self-pay | Admitting: Rheumatology

## 2024-10-01 ENCOUNTER — Encounter: Payer: Self-pay | Admitting: *Deleted

## 2024-10-01 NOTE — Telephone Encounter (Signed)
 After reviewing the patient's chart, patient's last bone density was on 07/26/2022, ordered by PCP. Believe patient is due for update bone density per last office note. Please advise.

## 2024-10-01 NOTE — Telephone Encounter (Signed)
 Due to update DEXA-ok to place order.   This is an excellent exercise regimen.   Recommend adding in some weight lifting/resistive exercises.  Calcium  1200 mg daily and vitamin D  1,000 units daily.

## 2024-10-03 ENCOUNTER — Encounter: Payer: Self-pay | Admitting: Family Medicine

## 2024-10-05 ENCOUNTER — Other Ambulatory Visit: Payer: Self-pay | Admitting: *Deleted

## 2024-10-05 DIAGNOSIS — M8589 Other specified disorders of bone density and structure, multiple sites: Secondary | ICD-10-CM

## 2024-10-05 NOTE — Telephone Encounter (Signed)
 I called patient, patient verbalized understanding.

## 2024-10-11 ENCOUNTER — Ambulatory Visit: Payer: Self-pay | Admitting: Rheumatology

## 2024-10-11 ENCOUNTER — Ambulatory Visit (HOSPITAL_COMMUNITY)
Admission: RE | Admit: 2024-10-11 | Discharge: 2024-10-11 | Disposition: A | Discharge status: Home / Self Care | Source: Ambulatory Visit | Attending: Rheumatology | Admitting: Rheumatology

## 2024-10-11 DIAGNOSIS — M810 Age-related osteoporosis without current pathological fracture: Secondary | ICD-10-CM

## 2024-10-11 DIAGNOSIS — M1712 Unilateral primary osteoarthritis, left knee: Secondary | ICD-10-CM

## 2024-10-11 DIAGNOSIS — M8589 Other specified disorders of bone density and structure, multiple sites: Secondary | ICD-10-CM | POA: Insufficient documentation

## 2024-10-11 NOTE — Progress Notes (Signed)
 DEXA scan suggestive of osteopenia.  Patient should continue to take calcium  and vitamin D  and do resistive exercises.  Will discuss results at the follow-up visit.

## 2024-10-11 NOTE — Progress Notes (Signed)
 Okay to refer her to physical therapy for the treatment of osteoporosis

## 2024-10-17 ENCOUNTER — Other Ambulatory Visit: Payer: Self-pay

## 2024-10-17 MED ORDER — OTEZLA 30 MG PO TABS: 1.0000 | ORAL_TABLET | Freq: Two times a day (BID) | ORAL | 0 refills | Status: AC

## 2024-10-17 NOTE — Telephone Encounter (Signed)
 Refill request received via fax from [facility] for Otezla   Last Fill: 08/01/2024  Labs: 07/16/2024 BMP BUN 5 eGFR 59 BUN/Creatinine Ratio 5 Chloride 107  06/18/2024 CBC RBC 5.30 MCV 75 MCH 24.3 RDW 15.7 Lymphocytes Absolute 3.5  Next Visit: 01/24/2025  Last Visit: 07/25/2024  DX:  Psoriatic arthritis (HCC)   Current Dose per office note 07/25/2024:  Otezla  30 mg 1 tablet by mouth twice daily.   Okay to refill Otezla ?

## 2024-11-05 ENCOUNTER — Ambulatory Visit (HOSPITAL_COMMUNITY)

## 2024-11-27 ENCOUNTER — Ambulatory Visit (HOSPITAL_COMMUNITY)

## 2024-12-20 ENCOUNTER — Ambulatory Visit: Admitting: Family Medicine

## 2025-01-24 ENCOUNTER — Ambulatory Visit: Admitting: Rheumatology

## 2025-03-29 ENCOUNTER — Ambulatory Visit
